# Patient Record
Sex: Female | Born: 1935
Health system: Southern US, Community
[De-identification: ages and names within clinical notes are randomized; demographics above are authoritative.]

## PROBLEM LIST (undated history)

## (undated) DIAGNOSIS — H9191 Unspecified hearing loss, right ear: Secondary | ICD-10-CM

## (undated) DIAGNOSIS — M1990 Unspecified osteoarthritis, unspecified site: Secondary | ICD-10-CM

## (undated) DIAGNOSIS — H9192 Unspecified hearing loss, left ear: Secondary | ICD-10-CM

## (undated) DIAGNOSIS — E042 Nontoxic multinodular goiter: Secondary | ICD-10-CM

## (undated) DIAGNOSIS — I10 Essential (primary) hypertension: Secondary | ICD-10-CM

## (undated) DIAGNOSIS — L409 Psoriasis, unspecified: Secondary | ICD-10-CM

## (undated) DIAGNOSIS — R011 Cardiac murmur, unspecified: Secondary | ICD-10-CM

## (undated) DIAGNOSIS — E0590 Thyrotoxicosis, unspecified without thyrotoxic crisis or storm: Secondary | ICD-10-CM

## (undated) DIAGNOSIS — D447 Neoplasm of uncertain behavior of aortic body and other paraganglia: Secondary | ICD-10-CM

## (undated) DIAGNOSIS — H3530 Unspecified macular degeneration: Secondary | ICD-10-CM

## (undated) DIAGNOSIS — M5030 Other cervical disc degeneration, unspecified cervical region: Secondary | ICD-10-CM

## (undated) DIAGNOSIS — K5790 Diverticulosis of intestine, part unspecified, without perforation or abscess without bleeding: Secondary | ICD-10-CM

## (undated) DIAGNOSIS — K219 Gastro-esophageal reflux disease without esophagitis: Secondary | ICD-10-CM

## (undated) DIAGNOSIS — J189 Pneumonia, unspecified organism: Secondary | ICD-10-CM

## (undated) DIAGNOSIS — N200 Calculus of kidney: Secondary | ICD-10-CM

## (undated) HISTORY — DX: Calculus of kidney: N20.0

## (undated) HISTORY — DX: Cardiac murmur, unspecified: R01.1

## (undated) HISTORY — PX: OTHER SURGICAL HISTORY: SHX169

## (undated) HISTORY — DX: Unspecified hearing loss, right ear: H91.91

## (undated) HISTORY — PX: CATARACT EXTRACTION: SUR2

## (undated) HISTORY — DX: Unspecified hearing loss, left ear: H91.92

## (undated) HISTORY — DX: Other cervical disc degeneration, unspecified cervical region: M50.30

## (undated) HISTORY — DX: Unspecified osteoarthritis, unspecified site: M19.90

## (undated) HISTORY — DX: Diverticulosis of intestine, part unspecified, without perforation or abscess without bleeding: K57.90

## (undated) HISTORY — PX: APPENDECTOMY: SHX54

## (undated) HISTORY — DX: Psoriasis, unspecified: L40.9

## (undated) HISTORY — PX: COLONOSCOPY: SHX174

---

## 1977-07-22 HISTORY — PX: TOTAL ABDOMINAL HYSTERECTOMY: SHX209

## 1994-07-22 HISTORY — PX: BILATERAL SALPINGOOPHORECTOMY: SHX1223

## 2000-07-22 HISTORY — PX: CHOLECYSTECTOMY: SHX55

## 2000-11-20 ENCOUNTER — Encounter: Admission: RE | Admit: 2000-11-20 | Discharge: 2000-11-20 | Payer: Self-pay | Admitting: *Deleted

## 2000-11-20 ENCOUNTER — Encounter: Payer: Self-pay | Admitting: *Deleted

## 2000-12-25 ENCOUNTER — Encounter (INDEPENDENT_AMBULATORY_CARE_PROVIDER_SITE_OTHER): Payer: Self-pay | Admitting: *Deleted

## 2000-12-25 ENCOUNTER — Ambulatory Visit (HOSPITAL_COMMUNITY): Admission: RE | Admit: 2000-12-25 | Discharge: 2000-12-26 | Payer: Self-pay

## 2001-12-08 ENCOUNTER — Encounter: Admission: RE | Admit: 2001-12-08 | Discharge: 2002-03-08 | Payer: Self-pay | Admitting: *Deleted

## 2002-03-11 ENCOUNTER — Ambulatory Visit (HOSPITAL_COMMUNITY): Admission: RE | Admit: 2002-03-11 | Discharge: 2002-03-11 | Payer: Self-pay | Admitting: *Deleted

## 2005-01-29 ENCOUNTER — Encounter: Admission: RE | Admit: 2005-01-29 | Discharge: 2005-04-15 | Payer: Self-pay | Admitting: *Deleted

## 2006-03-12 ENCOUNTER — Encounter: Admission: RE | Admit: 2006-03-12 | Discharge: 2006-05-06 | Payer: Self-pay | Admitting: Family Medicine

## 2007-11-19 ENCOUNTER — Encounter: Admission: RE | Admit: 2007-11-19 | Discharge: 2007-11-19 | Payer: Self-pay | Admitting: Specialist

## 2009-03-02 ENCOUNTER — Other Ambulatory Visit: Admission: RE | Admit: 2009-03-02 | Discharge: 2009-03-02 | Payer: Self-pay | Admitting: Obstetrics and Gynecology

## 2010-12-07 NOTE — Op Note (Signed)
[place]. [facility]  Patient:    Lisa [name], Lisa [name]                          MRN: [id number] Proc. Date: 12/25/00 Adm. Date:  [id number] Attending:  Gennie Alma CC:         Al Decant. Janey Greaser, M.D.   Operative Report  CCS# [id number]  PREOPERATIVE DIAGNOSIS:  Chronic cholecystitis and cholelithiasis.  POSTOPERATIVE DIAGNOSIS:  Chronic cholecystitis and cholelithiasis.  OPERATION PERFORMED:  Laparoscopic cholecystectomy.  SURGEON:  Milus Mallick, M.D.  ASSISTANT:  Abigail Miyamoto, M.D.  ANESTHESIA:  General endotracheal.  DESCRIPTION OF PROCEDURE:  Under adequate general endotracheal anesthesia, the patients abdomen was prepped and draped in the usual fashion.  A short vertical incision was made superior to the umbilicus because of the scars of previous surgery that were inferior to the umbilicus.  The incision was carried down through the subcutaneous tissues.  The deep fascia was exposed and incised longitudinally.  The peritoneum was incised and under direct vision, the 10 mm Hasson cannula was introduced into the peritoneal cavity atraumatically.  Holding sutures of 0 Vicryl were placed on the edge of the fascia to secure the Hasson cannula.  The abdomen was inflated with carbon dioxide to a filling pressure of 15 mmHg with an automatic insufflator.  A video laparoscope was introduced and under direct vision, an upper midline 10 mm port was inserted and two 5 mm subcostal ports.  The gallbladder was exposed and grasped with grasping forceps placed down the subcostal ports placed on traction superiorly.  The patient was placed in the reversed Trendelenburg position and turned to the left.  There was a large portion of omentum that was adherent to the gallbladder and this was taken down over the Endoclips and with some electrocoagulation.  The gallbladder was then completely exposed and the triangle of Calot.  Dissection was carried out  the triangle of Calot.  The cystic duct was isolated, skeletonized, quadruply clipped with endoclips and transected between the distal two clips.  The cystic artery was similarly handled except that three clips were applied and the artery was transected between the distal two clips.  The gallbladder was dissected out of the gallbladder bed of the liver using hook coagulation. There was a pinpoint hole made in the gallbladder doing this with some leakage of bile but not very much.  The bile was aspirated and the area was irrigated with saline.  When the gallbladder was completely removed from the gallbladder bed, the bed was checked and there were no areas of bleeding.  The gallbladder was placed in an Endocatch bag which was withdrawn into the umbilical port which was withdrawn from the abdomen, thereby removing the gallbladder.  It had a single large stone.  Next, the subhepatic and subphrenic spaces were irrigated with sterile saline solution until clear.  The medial subcostal port was withdrawn and all was well.  The lateral port was withdrawn and there was bleeding internally from the lateral subcostal port incision. Electrocoagulation was attempted but did not stop the bleeding.  Therefore, an Endostitch was placed with 0 Vicryl for control of the bleeding.  Next, the umbilical port was removed and the fascia was repaired with interrupted sutures of 0 PDS.  The abdomen was deflated and the upper midline port was removed.  All of the subcuticular layers were repaired with 5-0 Vicryl and half-inch Steri-Strips.  All of the incisions  were infiltrated with 0.25% Marcaine with 1:100,000 parts epinephrine.  A total of 15 cc was used. The patient tolerated the procedure well and left the operating room in satisfactory condition. DD:  12/25/00 TD:  12/25/00 Job: [id number]

## 2011-08-08 DIAGNOSIS — Z1231 Encounter for screening mammogram for malignant neoplasm of breast: Secondary | ICD-10-CM | POA: Diagnosis not present

## 2011-09-09 DIAGNOSIS — L408 Other psoriasis: Secondary | ICD-10-CM | POA: Diagnosis not present

## 2011-09-09 DIAGNOSIS — L820 Inflamed seborrheic keratosis: Secondary | ICD-10-CM | POA: Diagnosis not present

## 2011-09-09 DIAGNOSIS — D485 Neoplasm of uncertain behavior of skin: Secondary | ICD-10-CM | POA: Diagnosis not present

## 2011-09-09 DIAGNOSIS — L940 Localized scleroderma [morphea]: Secondary | ICD-10-CM | POA: Diagnosis not present

## 2011-11-06 DIAGNOSIS — H903 Sensorineural hearing loss, bilateral: Secondary | ICD-10-CM | POA: Diagnosis not present

## 2011-11-06 DIAGNOSIS — H905 Unspecified sensorineural hearing loss: Secondary | ICD-10-CM | POA: Diagnosis not present

## 2011-11-07 ENCOUNTER — Other Ambulatory Visit: Payer: Self-pay | Admitting: Otolaryngology

## 2011-11-12 ENCOUNTER — Ambulatory Visit
Admission: RE | Admit: 2011-11-12 | Discharge: 2011-11-12 | Disposition: A | Discharge status: Home / Self Care | Payer: Medicare Other | Source: Ambulatory Visit | Attending: Otolaryngology | Admitting: Otolaryngology

## 2011-11-12 DIAGNOSIS — H9190 Unspecified hearing loss, unspecified ear: Secondary | ICD-10-CM | POA: Diagnosis not present

## 2011-11-12 MED ORDER — GADOBENATE DIMEGLUMINE 529 MG/ML IV SOLN
20.0000 mL | Freq: Once | INTRAVENOUS | Status: AC | PRN
  Administered 2011-11-12: 20 mL via INTRAVENOUS

## 2011-11-20 DIAGNOSIS — D447 Neoplasm of uncertain behavior of aortic body and other paraganglia: Secondary | ICD-10-CM | POA: Diagnosis not present

## 2011-12-03 DIAGNOSIS — H903 Sensorineural hearing loss, bilateral: Secondary | ICD-10-CM | POA: Diagnosis not present

## 2011-12-03 DIAGNOSIS — D447 Neoplasm of uncertain behavior of aortic body and other paraganglia: Secondary | ICD-10-CM | POA: Diagnosis not present

## 2011-12-17 DIAGNOSIS — D356 Benign neoplasm of aortic body and other paraganglia: Secondary | ICD-10-CM | POA: Diagnosis not present

## 2011-12-19 DIAGNOSIS — H905 Unspecified sensorineural hearing loss: Secondary | ICD-10-CM | POA: Diagnosis not present

## 2011-12-19 DIAGNOSIS — R1310 Dysphagia, unspecified: Secondary | ICD-10-CM | POA: Diagnosis not present

## 2011-12-19 DIAGNOSIS — D447 Neoplasm of uncertain behavior of aortic body and other paraganglia: Secondary | ICD-10-CM | POA: Diagnosis not present

## 2011-12-19 DIAGNOSIS — H903 Sensorineural hearing loss, bilateral: Secondary | ICD-10-CM | POA: Diagnosis not present

## 2011-12-26 DIAGNOSIS — K219 Gastro-esophageal reflux disease without esophagitis: Secondary | ICD-10-CM | POA: Diagnosis not present

## 2011-12-26 DIAGNOSIS — D447 Neoplasm of uncertain behavior of aortic body and other paraganglia: Secondary | ICD-10-CM | POA: Diagnosis not present

## 2011-12-26 DIAGNOSIS — R1310 Dysphagia, unspecified: Secondary | ICD-10-CM | POA: Diagnosis not present

## 2012-01-08 DIAGNOSIS — D369 Benign neoplasm, unspecified site: Secondary | ICD-10-CM | POA: Diagnosis not present

## 2012-01-08 DIAGNOSIS — E782 Mixed hyperlipidemia: Secondary | ICD-10-CM | POA: Diagnosis not present

## 2012-01-08 DIAGNOSIS — I10 Essential (primary) hypertension: Secondary | ICD-10-CM | POA: Diagnosis not present

## 2012-01-08 DIAGNOSIS — E119 Type 2 diabetes mellitus without complications: Secondary | ICD-10-CM | POA: Diagnosis not present

## 2012-01-08 DIAGNOSIS — E049 Nontoxic goiter, unspecified: Secondary | ICD-10-CM | POA: Diagnosis not present

## 2012-01-09 DIAGNOSIS — E785 Hyperlipidemia, unspecified: Secondary | ICD-10-CM | POA: Diagnosis not present

## 2012-01-09 DIAGNOSIS — Z8249 Family history of ischemic heart disease and other diseases of the circulatory system: Secondary | ICD-10-CM | POA: Diagnosis not present

## 2012-01-09 DIAGNOSIS — I252 Old myocardial infarction: Secondary | ICD-10-CM | POA: Diagnosis not present

## 2012-01-09 DIAGNOSIS — I10 Essential (primary) hypertension: Secondary | ICD-10-CM | POA: Diagnosis not present

## 2012-01-09 DIAGNOSIS — I359 Nonrheumatic aortic valve disorder, unspecified: Secondary | ICD-10-CM | POA: Diagnosis not present

## 2012-01-17 DIAGNOSIS — Z961 Presence of intraocular lens: Secondary | ICD-10-CM | POA: Diagnosis not present

## 2012-01-22 DIAGNOSIS — R21 Rash and other nonspecific skin eruption: Secondary | ICD-10-CM | POA: Diagnosis not present

## 2012-01-22 DIAGNOSIS — K130 Diseases of lips: Secondary | ICD-10-CM | POA: Diagnosis not present

## 2012-01-24 DIAGNOSIS — D356 Benign neoplasm of aortic body and other paraganglia: Secondary | ICD-10-CM | POA: Diagnosis not present

## 2012-01-24 DIAGNOSIS — D496 Neoplasm of unspecified behavior of brain: Secondary | ICD-10-CM | POA: Diagnosis not present

## 2012-01-24 DIAGNOSIS — D447 Neoplasm of uncertain behavior of aortic body and other paraganglia: Secondary | ICD-10-CM | POA: Diagnosis not present

## 2012-01-28 DIAGNOSIS — R509 Fever, unspecified: Secondary | ICD-10-CM | POA: Diagnosis not present

## 2012-01-28 DIAGNOSIS — R918 Other nonspecific abnormal finding of lung field: Secondary | ICD-10-CM | POA: Diagnosis not present

## 2012-02-06 DIAGNOSIS — N390 Urinary tract infection, site not specified: Secondary | ICD-10-CM | POA: Diagnosis not present

## 2012-02-17 DIAGNOSIS — E049 Nontoxic goiter, unspecified: Secondary | ICD-10-CM | POA: Diagnosis not present

## 2012-02-17 DIAGNOSIS — D447 Neoplasm of uncertain behavior of aortic body and other paraganglia: Secondary | ICD-10-CM | POA: Diagnosis not present

## 2012-02-27 DIAGNOSIS — I10 Essential (primary) hypertension: Secondary | ICD-10-CM | POA: Diagnosis not present

## 2012-02-27 DIAGNOSIS — R5381 Other malaise: Secondary | ICD-10-CM | POA: Diagnosis not present

## 2012-02-27 DIAGNOSIS — Z87440 Personal history of urinary (tract) infections: Secondary | ICD-10-CM | POA: Diagnosis not present

## 2012-02-27 DIAGNOSIS — R5383 Other fatigue: Secondary | ICD-10-CM | POA: Diagnosis not present

## 2012-03-16 DIAGNOSIS — E042 Nontoxic multinodular goiter: Secondary | ICD-10-CM | POA: Diagnosis not present

## 2012-06-10 DIAGNOSIS — D369 Benign neoplasm, unspecified site: Secondary | ICD-10-CM | POA: Diagnosis not present

## 2012-06-10 DIAGNOSIS — E119 Type 2 diabetes mellitus without complications: Secondary | ICD-10-CM | POA: Diagnosis not present

## 2012-06-10 DIAGNOSIS — E782 Mixed hyperlipidemia: Secondary | ICD-10-CM | POA: Diagnosis not present

## 2012-06-10 DIAGNOSIS — E049 Nontoxic goiter, unspecified: Secondary | ICD-10-CM | POA: Diagnosis not present

## 2012-06-10 DIAGNOSIS — I10 Essential (primary) hypertension: Secondary | ICD-10-CM | POA: Diagnosis not present

## 2012-06-10 DIAGNOSIS — R350 Frequency of micturition: Secondary | ICD-10-CM | POA: Diagnosis not present

## 2012-06-11 DIAGNOSIS — H903 Sensorineural hearing loss, bilateral: Secondary | ICD-10-CM | POA: Diagnosis not present

## 2012-06-11 DIAGNOSIS — H905 Unspecified sensorineural hearing loss: Secondary | ICD-10-CM | POA: Diagnosis not present

## 2012-06-11 DIAGNOSIS — D447 Neoplasm of uncertain behavior of aortic body and other paraganglia: Secondary | ICD-10-CM | POA: Diagnosis not present

## 2012-06-23 DIAGNOSIS — R350 Frequency of micturition: Secondary | ICD-10-CM | POA: Diagnosis not present

## 2012-06-23 DIAGNOSIS — N764 Abscess of vulva: Secondary | ICD-10-CM | POA: Diagnosis not present

## 2012-06-23 DIAGNOSIS — K6289 Other specified diseases of anus and rectum: Secondary | ICD-10-CM | POA: Diagnosis not present

## 2012-06-30 DIAGNOSIS — N390 Urinary tract infection, site not specified: Secondary | ICD-10-CM | POA: Diagnosis not present

## 2012-06-30 DIAGNOSIS — N764 Abscess of vulva: Secondary | ICD-10-CM | POA: Diagnosis not present

## 2012-07-07 DIAGNOSIS — L738 Other specified follicular disorders: Secondary | ICD-10-CM | POA: Diagnosis not present

## 2012-07-23 DIAGNOSIS — L738 Other specified follicular disorders: Secondary | ICD-10-CM | POA: Diagnosis not present

## 2012-07-24 DIAGNOSIS — D447 Neoplasm of uncertain behavior of aortic body and other paraganglia: Secondary | ICD-10-CM | POA: Diagnosis not present

## 2012-07-24 DIAGNOSIS — H903 Sensorineural hearing loss, bilateral: Secondary | ICD-10-CM | POA: Diagnosis not present

## 2012-07-24 DIAGNOSIS — H905 Unspecified sensorineural hearing loss: Secondary | ICD-10-CM | POA: Diagnosis not present

## 2012-08-18 DIAGNOSIS — R05 Cough: Secondary | ICD-10-CM | POA: Diagnosis not present

## 2012-08-18 DIAGNOSIS — J069 Acute upper respiratory infection, unspecified: Secondary | ICD-10-CM | POA: Diagnosis not present

## 2012-09-15 DIAGNOSIS — N760 Acute vaginitis: Secondary | ICD-10-CM | POA: Diagnosis not present

## 2012-09-15 DIAGNOSIS — R350 Frequency of micturition: Secondary | ICD-10-CM | POA: Diagnosis not present

## 2012-10-09 DIAGNOSIS — E119 Type 2 diabetes mellitus without complications: Secondary | ICD-10-CM | POA: Diagnosis not present

## 2012-10-09 DIAGNOSIS — I10 Essential (primary) hypertension: Secondary | ICD-10-CM | POA: Diagnosis not present

## 2012-10-09 DIAGNOSIS — R5381 Other malaise: Secondary | ICD-10-CM | POA: Diagnosis not present

## 2012-10-09 DIAGNOSIS — Z79899 Other long term (current) drug therapy: Secondary | ICD-10-CM | POA: Diagnosis not present

## 2012-10-20 DIAGNOSIS — IMO0002 Reserved for concepts with insufficient information to code with codable children: Secondary | ICD-10-CM | POA: Diagnosis not present

## 2012-11-17 DIAGNOSIS — M129 Arthropathy, unspecified: Secondary | ICD-10-CM | POA: Diagnosis not present

## 2012-11-17 DIAGNOSIS — L293 Anogenital pruritus, unspecified: Secondary | ICD-10-CM | POA: Diagnosis not present

## 2012-11-17 DIAGNOSIS — IMO0002 Reserved for concepts with insufficient information to code with codable children: Secondary | ICD-10-CM | POA: Diagnosis not present

## 2012-12-08 DIAGNOSIS — D447 Neoplasm of uncertain behavior of aortic body and other paraganglia: Secondary | ICD-10-CM | POA: Diagnosis not present

## 2012-12-08 DIAGNOSIS — H905 Unspecified sensorineural hearing loss: Secondary | ICD-10-CM | POA: Diagnosis not present

## 2012-12-08 DIAGNOSIS — H903 Sensorineural hearing loss, bilateral: Secondary | ICD-10-CM | POA: Diagnosis not present

## 2012-12-15 DIAGNOSIS — K219 Gastro-esophageal reflux disease without esophagitis: Secondary | ICD-10-CM | POA: Diagnosis not present

## 2012-12-15 DIAGNOSIS — IMO0002 Reserved for concepts with insufficient information to code with codable children: Secondary | ICD-10-CM | POA: Diagnosis not present

## 2012-12-15 DIAGNOSIS — L0390 Cellulitis, unspecified: Secondary | ICD-10-CM | POA: Diagnosis not present

## 2013-01-04 DIAGNOSIS — Z1231 Encounter for screening mammogram for malignant neoplasm of breast: Secondary | ICD-10-CM | POA: Diagnosis not present

## 2013-01-07 DIAGNOSIS — Z8249 Family history of ischemic heart disease and other diseases of the circulatory system: Secondary | ICD-10-CM | POA: Diagnosis not present

## 2013-01-07 DIAGNOSIS — I359 Nonrheumatic aortic valve disorder, unspecified: Secondary | ICD-10-CM | POA: Diagnosis not present

## 2013-01-07 DIAGNOSIS — I10 Essential (primary) hypertension: Secondary | ICD-10-CM | POA: Diagnosis not present

## 2013-01-13 DIAGNOSIS — H1030 Unspecified acute conjunctivitis, unspecified eye: Secondary | ICD-10-CM | POA: Diagnosis not present

## 2013-01-13 DIAGNOSIS — H10439 Chronic follicular conjunctivitis, unspecified eye: Secondary | ICD-10-CM | POA: Diagnosis not present

## 2013-01-19 DIAGNOSIS — E042 Nontoxic multinodular goiter: Secondary | ICD-10-CM | POA: Diagnosis not present

## 2013-01-19 DIAGNOSIS — D447 Neoplasm of uncertain behavior of aortic body and other paraganglia: Secondary | ICD-10-CM | POA: Diagnosis not present

## 2013-01-19 DIAGNOSIS — H903 Sensorineural hearing loss, bilateral: Secondary | ICD-10-CM | POA: Diagnosis not present

## 2013-01-19 DIAGNOSIS — H905 Unspecified sensorineural hearing loss: Secondary | ICD-10-CM | POA: Diagnosis not present

## 2013-02-11 DIAGNOSIS — Z961 Presence of intraocular lens: Secondary | ICD-10-CM | POA: Diagnosis not present

## 2013-02-11 DIAGNOSIS — H52229 Regular astigmatism, unspecified eye: Secondary | ICD-10-CM | POA: Diagnosis not present

## 2013-02-11 DIAGNOSIS — H5210 Myopia, unspecified eye: Secondary | ICD-10-CM | POA: Diagnosis not present

## 2013-02-24 DIAGNOSIS — L03019 Cellulitis of unspecified finger: Secondary | ICD-10-CM | POA: Diagnosis not present

## 2013-04-05 DIAGNOSIS — E049 Nontoxic goiter, unspecified: Secondary | ICD-10-CM | POA: Diagnosis not present

## 2013-05-06 DIAGNOSIS — L408 Other psoriasis: Secondary | ICD-10-CM | POA: Diagnosis not present

## 2013-05-06 DIAGNOSIS — B078 Other viral warts: Secondary | ICD-10-CM | POA: Diagnosis not present

## 2013-05-06 DIAGNOSIS — L820 Inflamed seborrheic keratosis: Secondary | ICD-10-CM | POA: Diagnosis not present

## 2013-05-06 DIAGNOSIS — Z23 Encounter for immunization: Secondary | ICD-10-CM | POA: Diagnosis not present

## 2013-07-05 DIAGNOSIS — G8929 Other chronic pain: Secondary | ICD-10-CM | POA: Insufficient documentation

## 2013-07-05 DIAGNOSIS — I10 Essential (primary) hypertension: Secondary | ICD-10-CM | POA: Diagnosis not present

## 2013-07-05 DIAGNOSIS — M5126 Other intervertebral disc displacement, lumbar region: Secondary | ICD-10-CM | POA: Insufficient documentation

## 2013-07-05 DIAGNOSIS — E669 Obesity, unspecified: Secondary | ICD-10-CM | POA: Diagnosis not present

## 2013-07-05 DIAGNOSIS — M5417 Radiculopathy, lumbosacral region: Secondary | ICD-10-CM | POA: Insufficient documentation

## 2013-07-05 DIAGNOSIS — IMO0002 Reserved for concepts with insufficient information to code with codable children: Secondary | ICD-10-CM | POA: Diagnosis not present

## 2013-10-01 DIAGNOSIS — R350 Frequency of micturition: Secondary | ICD-10-CM | POA: Diagnosis not present

## 2013-10-01 DIAGNOSIS — N9489 Other specified conditions associated with female genital organs and menstrual cycle: Secondary | ICD-10-CM | POA: Diagnosis not present

## 2013-10-01 DIAGNOSIS — Z01419 Encounter for gynecological examination (general) (routine) without abnormal findings: Secondary | ICD-10-CM | POA: Diagnosis not present

## 2013-10-06 DIAGNOSIS — R05 Cough: Secondary | ICD-10-CM | POA: Diagnosis not present

## 2013-10-06 DIAGNOSIS — R059 Cough, unspecified: Secondary | ICD-10-CM | POA: Diagnosis not present

## 2013-11-05 DIAGNOSIS — Z1211 Encounter for screening for malignant neoplasm of colon: Secondary | ICD-10-CM | POA: Diagnosis not present

## 2013-11-05 DIAGNOSIS — K648 Other hemorrhoids: Secondary | ICD-10-CM | POA: Diagnosis not present

## 2013-11-05 DIAGNOSIS — Z8371 Family history of colonic polyps: Secondary | ICD-10-CM | POA: Diagnosis not present

## 2013-11-05 DIAGNOSIS — D126 Benign neoplasm of colon, unspecified: Secondary | ICD-10-CM | POA: Diagnosis not present

## 2013-11-05 DIAGNOSIS — K5730 Diverticulosis of large intestine without perforation or abscess without bleeding: Secondary | ICD-10-CM | POA: Diagnosis not present

## 2013-11-05 DIAGNOSIS — K5520 Angiodysplasia of colon without hemorrhage: Secondary | ICD-10-CM | POA: Diagnosis not present

## 2013-11-24 DIAGNOSIS — IMO0002 Reserved for concepts with insufficient information to code with codable children: Secondary | ICD-10-CM | POA: Diagnosis not present

## 2013-12-06 DIAGNOSIS — L408 Other psoriasis: Secondary | ICD-10-CM | POA: Diagnosis not present

## 2013-12-06 DIAGNOSIS — D447 Neoplasm of uncertain behavior of aortic body and other paraganglia: Secondary | ICD-10-CM | POA: Diagnosis not present

## 2013-12-06 DIAGNOSIS — H903 Sensorineural hearing loss, bilateral: Secondary | ICD-10-CM | POA: Diagnosis not present

## 2013-12-06 DIAGNOSIS — H905 Unspecified sensorineural hearing loss: Secondary | ICD-10-CM | POA: Diagnosis not present

## 2014-01-06 ENCOUNTER — Encounter: Payer: Self-pay | Admitting: Cardiology

## 2014-01-06 ENCOUNTER — Ambulatory Visit (INDEPENDENT_AMBULATORY_CARE_PROVIDER_SITE_OTHER): Payer: Medicare Other | Admitting: Cardiology

## 2014-01-06 ENCOUNTER — Encounter (INDEPENDENT_AMBULATORY_CARE_PROVIDER_SITE_OTHER): Payer: Self-pay

## 2014-01-06 VITALS — BP 176/81 | HR 88 | Ht 61.0 in | Wt 212.0 lb

## 2014-01-06 DIAGNOSIS — I10 Essential (primary) hypertension: Secondary | ICD-10-CM

## 2014-01-06 DIAGNOSIS — I350 Nonrheumatic aortic (valve) stenosis: Secondary | ICD-10-CM

## 2014-01-06 DIAGNOSIS — E119 Type 2 diabetes mellitus without complications: Secondary | ICD-10-CM | POA: Insufficient documentation

## 2014-01-06 DIAGNOSIS — I359 Nonrheumatic aortic valve disorder, unspecified: Secondary | ICD-10-CM

## 2014-01-06 DIAGNOSIS — E1165 Type 2 diabetes mellitus with hyperglycemia: Secondary | ICD-10-CM | POA: Insufficient documentation

## 2014-01-06 DIAGNOSIS — Z8249 Family history of ischemic heart disease and other diseases of the circulatory system: Secondary | ICD-10-CM | POA: Diagnosis not present

## 2014-01-06 MED ORDER — LOSARTAN POTASSIUM-HCTZ 100-25 MG PO TABS: 1.0000 | ORAL_TABLET | Freq: Every day | ORAL | Status: DC

## 2014-01-06 NOTE — Patient Instructions (Addendum)
Your physician has recommended you make the following change in your medication:  1. Increase Hyzaar 100/25 mg 1 tab daily  Your physician wants you to follow-up in: 1 year with Dr. Dawna Part will receive a reminder letter in the mail two months in advance. If you don't receive a letter, please call our office to schedule the follow-up appointment.    Your physician has requested that you have an echocardiogram. Echocardiography is a painless test that uses sound waves to create images of your heart. It provides your doctor with information about the size and shape of your heart and how well your heart's chambers and valves are working. This procedure takes approximately one hour. There are no restrictions for this procedure.

## 2014-01-06 NOTE — Progress Notes (Signed)
[place]. [address], [address], [place]  [address] Phone: [phone] Fax:  [phone]  Date:  01/06/2014   ID:  Lisa [name], DOB February 01, 1936, MRN [id number]  PCP:  Gerrit Heck, MD   History of Present Illness: Lisa [name] is a 78 y.o. female with hypertension, obesity, borderline diabetes, family history of CAD here for follow up visit. 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 This is currently the size of a thumbnail, discovered when she had an MRI to consider implant. [facility].   She states that she has her ups and downs. She has been taking her meloxicam on a daily basis because of pain. Approximately 2 weeks ago she did have some body aches. No fevers. Almost flulike symptoms. Her blood pressure has been elevated recently.   No chest pain. Has severe OA and has trouble with stairs. Water aerobics. Mild dyspnea. Improving. No syncope, no palpitations. Her brother and sister recently had CAD issues. Brother had CABG 15 years ago.  We discussed her nuclear stress test from 2009 as well as her echocardiogram and both were reassuring in the sense that there was no evidence of inferior infarction as demonstrated on EKG both today and previously.  Has aortic murmur. Radiation to the carotids. Carotid Doppler was normal. Aortic murmur was assessed in 2012 showed only mild stenosis.    Wt Readings from Last 3 Encounters:  01/06/14 212 lb (96.163 kg)     Past Medical History  Diagnosis Date  . Arthritis   . Deafness in right ear   . Hearing loss in left ear   . Psoriasis   . Heart murmur     History reviewed. No pertinent past surgical history.  Current Outpatient Prescriptions  Medication Sig Dispense Refill  . acetaminophen (TYLENOL) 325 MG tablet Take 650 mg by mouth every 6 (six) hours as needed. 3/4 tabs daily if needed      . amLODipine (NORVASC) 5 MG tablet Take 5 mg by mouth daily.         [name] Kitchen aspirin 81 MG tablet Take 81 mg by mouth as needed for pain.      [name] Kitchen losartan-hydrochlorothiazide (HYZAAR) 50-12.5 MG per tablet Take 1 tablet by mouth daily.       . meloxicam (MOBIC) 7.5 MG tablet Take 7.5 mg by mouth daily.       [name] Kitchen omeprazole (PRILOSEC) 20 MG capsule Take 20 mg by mouth daily.        No current facility-administered medications for this visit.    Allergies:    Allergies  Allergen Reactions  . Lisinopril     Cough   . Penicillins     whelp    Social History:  The patient  reports that she has quit smoking. She does not have any smokeless tobacco history on file.   Family History  Problem Relation Age of Onset  . Heart disease Mother   . Heart disease Father   . Heart disease Brother   . Heart disease Paternal Grandfather   . Heart disease Brother     ROS:  Please see the history of present illness.     All other systems reviewed and negative.   PHYSICAL EXAM: VS:  BP 176/81  Pulse 88  Ht 5\' 1"  (1.549 m)  Wt 212 lb (96.163 kg)  BMI 40.08 kg/m2 Well nourished, well developed, in no acute  distress HEENT: normal, /AT, EOMI Neck: no JVD, normal carotid upstroke, no bruit Cardiac:  normal S1, S2; RRR; 2/6 SM RUSB Lungs:  clear to auscultation bilaterally, no wheezing, rhonchi or rales Abd: soft, nontender, no hepatomegaly, no bruits Ext: no edema, 2+ distal pulses, overweight.  Skin: warm and dry GU: deferred Neuro: no focal abnormalities noted, AAO x 3  EKG:   01/06/14-sinus rhythm, 89, old inferior infarct pattern. No change from prior. Nuclear stress test 2009-low risk, no ischemia the ECHO: 2011 - mild aortic stenosis LABS: 2014 -  LDL 110, TSH normal, Hg 15, Creat 0.7  ASSESSMENT AND PLAN:  1. Aortic stenosis-previously mild several years ago. Murmur appreciated. I will repeat echocardiogram. 2. Hypertension-multidrug regimen. NSAIDs can increase blood pressure. 140-180 SBP. Needs better control. I will go ahead and double her Hyzaar.  100/25. She will be seeing Dr. Drema Dallas next Wednesday. Continue with amlodipine. She seemed a bit concerned over change in medication dose. I did state that discontinuation of meloxicam to help blood pressure however she's been taking this daily because of her arthritic pain. 3. Family history coronary artery disease-Brother, father, nephew. Aggressive primary prevention, blood pressure control. 4. Listed in her past medical history from Dr. Drema Dallas. Currently on no medications, diet controlled. 5. Morbid obesity-continue to encourage weight loss. 6. One year followup  Signed, Candee Furbish, MD [facility]  01/06/2014 9:49 AM

## 2014-01-10 DIAGNOSIS — Z1231 Encounter for screening mammogram for malignant neoplasm of breast: Secondary | ICD-10-CM | POA: Diagnosis not present

## 2014-01-12 DIAGNOSIS — Z23 Encounter for immunization: Secondary | ICD-10-CM | POA: Diagnosis not present

## 2014-01-12 DIAGNOSIS — E782 Mixed hyperlipidemia: Secondary | ICD-10-CM | POA: Diagnosis not present

## 2014-01-12 DIAGNOSIS — M899 Disorder of bone, unspecified: Secondary | ICD-10-CM | POA: Diagnosis not present

## 2014-01-12 DIAGNOSIS — L408 Other psoriasis: Secondary | ICD-10-CM | POA: Diagnosis not present

## 2014-01-12 DIAGNOSIS — R5383 Other fatigue: Secondary | ICD-10-CM | POA: Diagnosis not present

## 2014-01-12 DIAGNOSIS — I10 Essential (primary) hypertension: Secondary | ICD-10-CM | POA: Diagnosis not present

## 2014-01-12 DIAGNOSIS — R5381 Other malaise: Secondary | ICD-10-CM | POA: Diagnosis not present

## 2014-01-12 DIAGNOSIS — E119 Type 2 diabetes mellitus without complications: Secondary | ICD-10-CM | POA: Diagnosis not present

## 2014-01-12 DIAGNOSIS — M949 Disorder of cartilage, unspecified: Secondary | ICD-10-CM | POA: Diagnosis not present

## 2014-01-12 DIAGNOSIS — Z1331 Encounter for screening for depression: Secondary | ICD-10-CM | POA: Diagnosis not present

## 2014-01-13 ENCOUNTER — Ambulatory Visit (HOSPITAL_COMMUNITY): Payer: Medicare Other | Attending: Internal Medicine | Admitting: Radiology

## 2014-01-13 DIAGNOSIS — I359 Nonrheumatic aortic valve disorder, unspecified: Secondary | ICD-10-CM

## 2014-01-13 DIAGNOSIS — I350 Nonrheumatic aortic (valve) stenosis: Secondary | ICD-10-CM

## 2014-01-13 DIAGNOSIS — I4891 Unspecified atrial fibrillation: Secondary | ICD-10-CM | POA: Insufficient documentation

## 2014-01-13 NOTE — Progress Notes (Signed)
Echocardiogram performed.  

## 2014-01-27 DIAGNOSIS — H903 Sensorineural hearing loss, bilateral: Secondary | ICD-10-CM | POA: Diagnosis not present

## 2014-01-27 DIAGNOSIS — D447 Neoplasm of uncertain behavior of aortic body and other paraganglia: Secondary | ICD-10-CM | POA: Diagnosis not present

## 2014-01-27 DIAGNOSIS — H6590 Unspecified nonsuppurative otitis media, unspecified ear: Secondary | ICD-10-CM | POA: Diagnosis not present

## 2014-01-27 DIAGNOSIS — H905 Unspecified sensorineural hearing loss: Secondary | ICD-10-CM | POA: Diagnosis not present

## 2014-01-27 DIAGNOSIS — L408 Other psoriasis: Secondary | ICD-10-CM | POA: Diagnosis not present

## 2014-02-03 ENCOUNTER — Other Ambulatory Visit: Payer: Self-pay | Admitting: Rheumatology

## 2014-02-03 ENCOUNTER — Ambulatory Visit
Admission: RE | Admit: 2014-02-03 | Discharge: 2014-02-03 | Disposition: A | Discharge status: Home / Self Care | Payer: Medicare Other | Source: Ambulatory Visit | Attending: Rheumatology | Admitting: Rheumatology

## 2014-02-03 DIAGNOSIS — M79671 Pain in right foot: Secondary | ICD-10-CM

## 2014-02-03 DIAGNOSIS — M545 Low back pain, unspecified: Secondary | ICD-10-CM | POA: Diagnosis not present

## 2014-02-03 DIAGNOSIS — L408 Other psoriasis: Secondary | ICD-10-CM | POA: Diagnosis not present

## 2014-02-03 DIAGNOSIS — M130 Polyarthritis, unspecified: Secondary | ICD-10-CM | POA: Diagnosis not present

## 2014-02-03 DIAGNOSIS — M79672 Pain in left foot: Principal | ICD-10-CM

## 2014-02-03 DIAGNOSIS — M25549 Pain in joints of unspecified hand: Secondary | ICD-10-CM | POA: Diagnosis not present

## 2014-02-03 DIAGNOSIS — M19079 Primary osteoarthritis, unspecified ankle and foot: Secondary | ICD-10-CM | POA: Diagnosis not present

## 2014-03-01 DIAGNOSIS — H353190 Nonexudative age-related macular degeneration, unspecified eye, stage unspecified: Secondary | ICD-10-CM | POA: Diagnosis not present

## 2014-03-01 DIAGNOSIS — I10 Essential (primary) hypertension: Secondary | ICD-10-CM | POA: Diagnosis not present

## 2014-03-01 DIAGNOSIS — H35039 Hypertensive retinopathy, unspecified eye: Secondary | ICD-10-CM | POA: Diagnosis not present

## 2014-03-07 DIAGNOSIS — M545 Low back pain, unspecified: Secondary | ICD-10-CM | POA: Diagnosis not present

## 2014-03-07 DIAGNOSIS — M25579 Pain in unspecified ankle and joints of unspecified foot: Secondary | ICD-10-CM | POA: Diagnosis not present

## 2014-03-07 DIAGNOSIS — L4050 Arthropathic psoriasis, unspecified: Secondary | ICD-10-CM | POA: Diagnosis not present

## 2014-03-07 DIAGNOSIS — M19049 Primary osteoarthritis, unspecified hand: Secondary | ICD-10-CM | POA: Diagnosis not present

## 2014-04-04 DIAGNOSIS — E049 Nontoxic goiter, unspecified: Secondary | ICD-10-CM | POA: Diagnosis not present

## 2014-04-13 DIAGNOSIS — L408 Other psoriasis: Secondary | ICD-10-CM | POA: Diagnosis not present

## 2014-04-13 DIAGNOSIS — M25579 Pain in unspecified ankle and joints of unspecified foot: Secondary | ICD-10-CM | POA: Diagnosis not present

## 2014-04-13 DIAGNOSIS — M25549 Pain in joints of unspecified hand: Secondary | ICD-10-CM | POA: Diagnosis not present

## 2014-04-13 DIAGNOSIS — M25569 Pain in unspecified knee: Secondary | ICD-10-CM | POA: Diagnosis not present

## 2014-04-26 DIAGNOSIS — R42 Dizziness and giddiness: Secondary | ICD-10-CM | POA: Diagnosis not present

## 2014-04-26 DIAGNOSIS — H6590 Unspecified nonsuppurative otitis media, unspecified ear: Secondary | ICD-10-CM | POA: Diagnosis not present

## 2014-05-13 DIAGNOSIS — Z23 Encounter for immunization: Secondary | ICD-10-CM | POA: Diagnosis not present

## 2014-06-07 DIAGNOSIS — J069 Acute upper respiratory infection, unspecified: Secondary | ICD-10-CM | POA: Diagnosis not present

## 2014-06-13 DIAGNOSIS — M25476 Effusion, unspecified foot: Secondary | ICD-10-CM | POA: Diagnosis not present

## 2014-06-13 DIAGNOSIS — L409 Psoriasis, unspecified: Secondary | ICD-10-CM | POA: Diagnosis not present

## 2014-06-13 DIAGNOSIS — M25442 Effusion, left hand: Secondary | ICD-10-CM | POA: Diagnosis not present

## 2014-06-13 DIAGNOSIS — M25441 Effusion, right hand: Secondary | ICD-10-CM | POA: Diagnosis not present

## 2014-07-05 DIAGNOSIS — H903 Sensorineural hearing loss, bilateral: Secondary | ICD-10-CM | POA: Diagnosis not present

## 2014-07-05 DIAGNOSIS — L409 Psoriasis, unspecified: Secondary | ICD-10-CM | POA: Diagnosis not present

## 2014-07-05 DIAGNOSIS — D447 Neoplasm of uncertain behavior of aortic body and other paraganglia: Secondary | ICD-10-CM | POA: Diagnosis not present

## 2014-08-10 DIAGNOSIS — H10403 Unspecified chronic conjunctivitis, bilateral: Secondary | ICD-10-CM | POA: Diagnosis not present

## 2014-08-17 DIAGNOSIS — E782 Mixed hyperlipidemia: Secondary | ICD-10-CM | POA: Diagnosis not present

## 2014-08-17 DIAGNOSIS — E559 Vitamin D deficiency, unspecified: Secondary | ICD-10-CM | POA: Diagnosis not present

## 2014-08-17 DIAGNOSIS — J069 Acute upper respiratory infection, unspecified: Secondary | ICD-10-CM | POA: Diagnosis not present

## 2014-08-17 DIAGNOSIS — I10 Essential (primary) hypertension: Secondary | ICD-10-CM | POA: Diagnosis not present

## 2014-08-17 DIAGNOSIS — E119 Type 2 diabetes mellitus without complications: Secondary | ICD-10-CM | POA: Diagnosis not present

## 2014-08-27 DIAGNOSIS — H04123 Dry eye syndrome of bilateral lacrimal glands: Secondary | ICD-10-CM | POA: Diagnosis not present

## 2014-09-28 DIAGNOSIS — Z79899 Other long term (current) drug therapy: Secondary | ICD-10-CM | POA: Diagnosis not present

## 2014-09-28 DIAGNOSIS — M25476 Effusion, unspecified foot: Secondary | ICD-10-CM | POA: Diagnosis not present

## 2014-09-28 DIAGNOSIS — M25442 Effusion, left hand: Secondary | ICD-10-CM | POA: Diagnosis not present

## 2014-09-28 DIAGNOSIS — M25441 Effusion, right hand: Secondary | ICD-10-CM | POA: Diagnosis not present

## 2014-09-28 DIAGNOSIS — L4050 Arthropathic psoriasis, unspecified: Secondary | ICD-10-CM | POA: Diagnosis not present

## 2014-10-07 DIAGNOSIS — N760 Acute vaginitis: Secondary | ICD-10-CM | POA: Diagnosis not present

## 2014-10-07 DIAGNOSIS — R300 Dysuria: Secondary | ICD-10-CM | POA: Diagnosis not present

## 2014-10-07 DIAGNOSIS — N952 Postmenopausal atrophic vaginitis: Secondary | ICD-10-CM | POA: Diagnosis not present

## 2014-10-19 DIAGNOSIS — Z79899 Other long term (current) drug therapy: Secondary | ICD-10-CM | POA: Diagnosis not present

## 2014-10-19 DIAGNOSIS — L4050 Arthropathic psoriasis, unspecified: Secondary | ICD-10-CM | POA: Diagnosis not present

## 2014-10-19 DIAGNOSIS — K219 Gastro-esophageal reflux disease without esophagitis: Secondary | ICD-10-CM | POA: Diagnosis not present

## 2014-11-09 DIAGNOSIS — J209 Acute bronchitis, unspecified: Secondary | ICD-10-CM | POA: Diagnosis not present

## 2014-11-17 DIAGNOSIS — R05 Cough: Secondary | ICD-10-CM | POA: Diagnosis not present

## 2014-12-08 DIAGNOSIS — L4050 Arthropathic psoriasis, unspecified: Secondary | ICD-10-CM | POA: Diagnosis not present

## 2014-12-08 DIAGNOSIS — K069 Disorder of gingiva and edentulous alveolar ridge, unspecified: Secondary | ICD-10-CM | POA: Diagnosis not present

## 2014-12-08 DIAGNOSIS — M25441 Effusion, right hand: Secondary | ICD-10-CM | POA: Diagnosis not present

## 2014-12-08 DIAGNOSIS — M25474 Effusion, right foot: Secondary | ICD-10-CM | POA: Diagnosis not present

## 2014-12-08 DIAGNOSIS — M25442 Effusion, left hand: Secondary | ICD-10-CM | POA: Diagnosis not present

## 2014-12-08 DIAGNOSIS — Z79899 Other long term (current) drug therapy: Secondary | ICD-10-CM | POA: Diagnosis not present

## 2014-12-11 DIAGNOSIS — S0101XA Laceration without foreign body of scalp, initial encounter: Secondary | ICD-10-CM | POA: Diagnosis not present

## 2014-12-11 DIAGNOSIS — Z23 Encounter for immunization: Secondary | ICD-10-CM | POA: Diagnosis not present

## 2014-12-11 DIAGNOSIS — S0990XA Unspecified injury of head, initial encounter: Secondary | ICD-10-CM | POA: Diagnosis not present

## 2014-12-14 DIAGNOSIS — M8580 Other specified disorders of bone density and structure, unspecified site: Secondary | ICD-10-CM | POA: Diagnosis not present

## 2014-12-14 DIAGNOSIS — E782 Mixed hyperlipidemia: Secondary | ICD-10-CM | POA: Diagnosis not present

## 2014-12-14 DIAGNOSIS — E559 Vitamin D deficiency, unspecified: Secondary | ICD-10-CM | POA: Diagnosis not present

## 2014-12-14 DIAGNOSIS — I350 Nonrheumatic aortic (valve) stenosis: Secondary | ICD-10-CM | POA: Diagnosis not present

## 2014-12-14 DIAGNOSIS — E119 Type 2 diabetes mellitus without complications: Secondary | ICD-10-CM | POA: Diagnosis not present

## 2014-12-14 DIAGNOSIS — I10 Essential (primary) hypertension: Secondary | ICD-10-CM | POA: Diagnosis not present

## 2014-12-16 ENCOUNTER — Other Ambulatory Visit: Payer: Self-pay | Admitting: Family Medicine

## 2014-12-16 DIAGNOSIS — M8580 Other specified disorders of bone density and structure, unspecified site: Secondary | ICD-10-CM

## 2014-12-29 DIAGNOSIS — L4050 Arthropathic psoriasis, unspecified: Secondary | ICD-10-CM | POA: Diagnosis not present

## 2014-12-29 DIAGNOSIS — Z79899 Other long term (current) drug therapy: Secondary | ICD-10-CM | POA: Diagnosis not present

## 2015-01-04 DIAGNOSIS — D447 Neoplasm of uncertain behavior of aortic body and other paraganglia: Secondary | ICD-10-CM | POA: Diagnosis not present

## 2015-01-04 DIAGNOSIS — L409 Psoriasis, unspecified: Secondary | ICD-10-CM | POA: Diagnosis not present

## 2015-01-04 DIAGNOSIS — H903 Sensorineural hearing loss, bilateral: Secondary | ICD-10-CM | POA: Diagnosis not present

## 2015-01-05 ENCOUNTER — Ambulatory Visit (INDEPENDENT_AMBULATORY_CARE_PROVIDER_SITE_OTHER): Payer: Medicare Other | Admitting: Cardiology

## 2015-01-05 ENCOUNTER — Encounter: Payer: Self-pay | Admitting: Cardiology

## 2015-01-05 VITALS — BP 150/64 | HR 65 | Ht 62.0 in | Wt 207.8 lb

## 2015-01-05 DIAGNOSIS — Z8249 Family history of ischemic heart disease and other diseases of the circulatory system: Secondary | ICD-10-CM

## 2015-01-05 DIAGNOSIS — I10 Essential (primary) hypertension: Secondary | ICD-10-CM

## 2015-01-05 DIAGNOSIS — I350 Nonrheumatic aortic (valve) stenosis: Secondary | ICD-10-CM

## 2015-01-05 MED ORDER — LOSARTAN POTASSIUM-HCTZ 100-25 MG PO TABS: 1.0000 | ORAL_TABLET | Freq: Every day | ORAL | Status: DC

## 2015-01-05 NOTE — Progress Notes (Signed)
[place]. [address], [address] [place], [place]  [address] Phone: [phone] Fax:  [phone]  Date:  01/05/2015   ID:  Lisa [name], DOB Sep 19, 1935, MRN [id number]  PCP:  Gerrit Heck, MD   History of Present Illness: Lisa [name] is a 79 y.o. female with hypertension, obesity, borderline diabetes, family history of CAD here for follow up visit. 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 This is currently the size of a thumbnail, discovered when she had an MRI to consider implant with Dr. Elwyn Reach. [place] was in 2012. Dr. Danne Baxter. Skipping MRI this year.   Fall 5/16 - Dr. Vertell Limber. Back pain.   She states that she has her ups and downs. No longer taking her meloxicam. No fevers. Almost flulike symptoms. Her blood pressure has been elevated recently.   No chest pain. Has severe OA and has trouble with stairs. Taking methotrexate.  Water aerobics. Mild dyspnea. Improving. No syncope, no palpitations. Her brother and sister recently had CAD issues. Brother had CABG 15 years ago.  We discussed her nuclear stress test from 2009 as well as her echocardiogram and both were reassuring in the sense that there was no evidence of inferior infarction as demonstrated on EKG both today and previously.  Has aortic murmur. Radiation to the carotids. Carotid Doppler was normal. Aortic murmur was assessed in 2015 showed only very mild stenosis.    Wt Readings from Last 3 Encounters:  01/05/15 207 lb 12.8 oz (94.257 kg)  01/06/14 212 lb (96.163 kg)     Past Medical History  Diagnosis Date  . Arthritis   . Deafness in right ear   . Hearing loss in left ear   . Psoriasis   . Heart murmur   . Diverticulosis   . Kidney stones   . DDD (degenerative disc disease), cervical     Past Surgical History  Procedure Laterality Date  . Appendectomy    . Total abdominal hysterectomy  1979    FIBROIDS   .  Bilateral salpingoophorectomy  1996  . Cholecystectomy  2002  . Cataract extraction    . Colonoscopy  2003/2009/2015    Current Outpatient Prescriptions  Medication Sig Dispense Refill  . acetaminophen (TYLENOL) 325 MG tablet Take 650 mg by mouth every 6 (six) hours as needed. 3/4 tabs daily if needed    . amLODipine (NORVASC) 5 MG tablet Take 5 mg by mouth daily.     [name] Kitchen aspirin 81 MG tablet Take 81 mg by mouth as needed for pain.    . folic acid (FOLVITE) 1 MG tablet Take 1 mg by mouth daily.     [name] Kitchen losartan-hydrochlorothiazide (HYZAAR) 100-25 MG per tablet Take 1 tablet by mouth daily. 30 tablet 12  . methotrexate (RHEUMATREX) 2.5 MG tablet Take 10 mg by mouth 2 (two) times a week.     [name] Kitchen omeprazole (PRILOSEC) 20 MG capsule Take 20 mg by mouth daily.     . predniSONE (DELTASONE) 5 MG tablet Take 5 mg by mouth every other day.      No current facility-administered medications for this visit.    Allergies:    Allergies  Allergen Reactions  . Lisinopril     Cough   . Penicillins     whelp    Social History:  The patient  reports that she has quit smoking. She does not have  any smokeless tobacco history on file. She reports that she drinks alcohol. She reports that she does not use illicit drugs.   Family History  Problem Relation Age of Onset  . Heart disease Mother   . Heart disease Father   . Heart disease Brother   . Heart disease Paternal Grandfather   . Heart disease Brother     ROS:  Please see the history of present illness.     All other systems reviewed and negative.   PHYSICAL EXAM: VS:  BP 150/64 mmHg  Pulse 65  Ht 5\' 2"  (1.575 m)  Wt 207 lb 12.8 oz (94.257 kg)  BMI 38.00 kg/m2 Well nourished, well developed, in no acute distress HEENT: normal, [place]/AT, EOMI Neck: no JVD, normal carotid upstroke, no bruit Cardiac:  normal S1, S2; RRR; 2/6 SM RUSB Lungs:  clear to auscultation bilaterally, no wheezing, rhonchi or rales Abd: soft, nontender, no hepatomegaly,  no bruits Ext: no edema, 2+ distal pulses, overweight.  Skin: warm and dry GU: deferred Neuro: no focal abnormalities noted, AAO x 3  EKG:   Today 01/05/15-sinus rhythm, 65, borderline LVH, no other abnormalities. Personally viewed-prior 01/06/14-sinus rhythm, 89, old inferior infarct pattern. No change from prior.  Nuclear stress test 2009-low risk, no ischemia   ECHO: 2015 - Left ventricle: The cavity size was normal. Wall thickness was normal. Systolic function was normal. The estimated ejection fraction was in the range of 60% to 65%. Doppler parameters are consistent with abnormal left ventricular relaxation (grade 1 diastolic dysfunction). - Aortic valve: AV is thickened, calcified with mildly restricted motion. Peak and mean gradients through the valve are 19 and 10 mm Hg respectively. - Mitral valve: There was mild regurgitation. - Left atrium: The atrium was mildly dilated.  LABS: 2014 -  LDL 110, TSH normal, Hg 15, Creat 0.7  ASSESSMENT AND PLAN:  1. Aortic stenosis-previously very mild several. Murmur appreciated. Last echocardiogram 01/13/14-continues to be mild. No further echocardiogram at this time, we may consider a few years down the road. 2. Hypertension-multidrug regimen.  130-180 SBP. Doubled her Hyzaar prior. 100/25. Continue with amlodipine. 90 day supply. She is no longer on meloxicam. Previous doctor's visits her systolic was in the 536 range. 3. Family history coronary artery disease-Brother, father, nephew. Aggressive primary prevention, blood pressure control. 4. Listed in her past medical history from Dr. Drema Dallas. Currently on no medications, diet controlled. 5. Obesity-continue to encourage weight loss. 6. One year followup  Signed, Candee Furbish, MD [facility]  01/05/2015 8:45 AM

## 2015-01-05 NOTE — Patient Instructions (Addendum)
Medication Instructions:  Your physician recommends that you continue on your current medications as directed. Please refer to the Current Medication list given to you today.  Follow-Up: Follow up in 1 year with Dr. Skains.  You will receive a letter in the mail 2 months before you are due.  Please call us when you receive this letter to schedule your follow up appointment.  Thank you for choosing [facility]!!       

## 2015-01-16 DIAGNOSIS — W101XXA Fall (on)(from) sidewalk curb, initial encounter: Secondary | ICD-10-CM | POA: Diagnosis not present

## 2015-01-16 DIAGNOSIS — M542 Cervicalgia: Secondary | ICD-10-CM | POA: Insufficient documentation

## 2015-01-16 DIAGNOSIS — M5412 Radiculopathy, cervical region: Secondary | ICD-10-CM | POA: Diagnosis not present

## 2015-01-18 DIAGNOSIS — M542 Cervicalgia: Secondary | ICD-10-CM | POA: Diagnosis not present

## 2015-01-18 DIAGNOSIS — W01198D Fall on same level from slipping, tripping and stumbling with subsequent striking against other object, subsequent encounter: Secondary | ICD-10-CM | POA: Diagnosis not present

## 2015-01-18 DIAGNOSIS — R2681 Unsteadiness on feet: Secondary | ICD-10-CM | POA: Diagnosis not present

## 2015-01-18 DIAGNOSIS — R293 Abnormal posture: Secondary | ICD-10-CM | POA: Diagnosis not present

## 2015-01-20 DIAGNOSIS — R2681 Unsteadiness on feet: Secondary | ICD-10-CM | POA: Diagnosis not present

## 2015-01-20 DIAGNOSIS — W01198D Fall on same level from slipping, tripping and stumbling with subsequent striking against other object, subsequent encounter: Secondary | ICD-10-CM | POA: Diagnosis not present

## 2015-01-20 DIAGNOSIS — M542 Cervicalgia: Secondary | ICD-10-CM | POA: Diagnosis not present

## 2015-01-20 DIAGNOSIS — R293 Abnormal posture: Secondary | ICD-10-CM | POA: Diagnosis not present

## 2015-01-25 DIAGNOSIS — R2681 Unsteadiness on feet: Secondary | ICD-10-CM | POA: Diagnosis not present

## 2015-01-25 DIAGNOSIS — W01198D Fall on same level from slipping, tripping and stumbling with subsequent striking against other object, subsequent encounter: Secondary | ICD-10-CM | POA: Diagnosis not present

## 2015-01-25 DIAGNOSIS — M542 Cervicalgia: Secondary | ICD-10-CM | POA: Diagnosis not present

## 2015-01-25 DIAGNOSIS — R293 Abnormal posture: Secondary | ICD-10-CM | POA: Diagnosis not present

## 2015-01-27 DIAGNOSIS — W01198D Fall on same level from slipping, tripping and stumbling with subsequent striking against other object, subsequent encounter: Secondary | ICD-10-CM | POA: Diagnosis not present

## 2015-01-27 DIAGNOSIS — M542 Cervicalgia: Secondary | ICD-10-CM | POA: Diagnosis not present

## 2015-01-27 DIAGNOSIS — R2681 Unsteadiness on feet: Secondary | ICD-10-CM | POA: Diagnosis not present

## 2015-01-27 DIAGNOSIS — R293 Abnormal posture: Secondary | ICD-10-CM | POA: Diagnosis not present

## 2015-01-30 DIAGNOSIS — M542 Cervicalgia: Secondary | ICD-10-CM | POA: Diagnosis not present

## 2015-01-30 DIAGNOSIS — R2681 Unsteadiness on feet: Secondary | ICD-10-CM | POA: Diagnosis not present

## 2015-01-30 DIAGNOSIS — W01198D Fall on same level from slipping, tripping and stumbling with subsequent striking against other object, subsequent encounter: Secondary | ICD-10-CM | POA: Diagnosis not present

## 2015-01-30 DIAGNOSIS — R293 Abnormal posture: Secondary | ICD-10-CM | POA: Diagnosis not present

## 2015-02-01 DIAGNOSIS — W01198D Fall on same level from slipping, tripping and stumbling with subsequent striking against other object, subsequent encounter: Secondary | ICD-10-CM | POA: Diagnosis not present

## 2015-02-01 DIAGNOSIS — R293 Abnormal posture: Secondary | ICD-10-CM | POA: Diagnosis not present

## 2015-02-01 DIAGNOSIS — M542 Cervicalgia: Secondary | ICD-10-CM | POA: Diagnosis not present

## 2015-02-01 DIAGNOSIS — R2681 Unsteadiness on feet: Secondary | ICD-10-CM | POA: Diagnosis not present

## 2015-02-06 DIAGNOSIS — W01198D Fall on same level from slipping, tripping and stumbling with subsequent striking against other object, subsequent encounter: Secondary | ICD-10-CM | POA: Diagnosis not present

## 2015-02-06 DIAGNOSIS — R2681 Unsteadiness on feet: Secondary | ICD-10-CM | POA: Diagnosis not present

## 2015-02-06 DIAGNOSIS — R293 Abnormal posture: Secondary | ICD-10-CM | POA: Diagnosis not present

## 2015-02-06 DIAGNOSIS — M542 Cervicalgia: Secondary | ICD-10-CM | POA: Diagnosis not present

## 2015-02-08 DIAGNOSIS — R296 Repeated falls: Secondary | ICD-10-CM | POA: Diagnosis not present

## 2015-02-08 DIAGNOSIS — L4050 Arthropathic psoriasis, unspecified: Secondary | ICD-10-CM | POA: Diagnosis not present

## 2015-02-08 DIAGNOSIS — Z79899 Other long term (current) drug therapy: Secondary | ICD-10-CM | POA: Diagnosis not present

## 2015-02-09 DIAGNOSIS — R2681 Unsteadiness on feet: Secondary | ICD-10-CM | POA: Diagnosis not present

## 2015-02-09 DIAGNOSIS — M542 Cervicalgia: Secondary | ICD-10-CM | POA: Diagnosis not present

## 2015-02-09 DIAGNOSIS — W01198D Fall on same level from slipping, tripping and stumbling with subsequent striking against other object, subsequent encounter: Secondary | ICD-10-CM | POA: Diagnosis not present

## 2015-02-09 DIAGNOSIS — R293 Abnormal posture: Secondary | ICD-10-CM | POA: Diagnosis not present

## 2015-02-13 DIAGNOSIS — W01198D Fall on same level from slipping, tripping and stumbling with subsequent striking against other object, subsequent encounter: Secondary | ICD-10-CM | POA: Diagnosis not present

## 2015-02-13 DIAGNOSIS — R293 Abnormal posture: Secondary | ICD-10-CM | POA: Diagnosis not present

## 2015-02-13 DIAGNOSIS — R2681 Unsteadiness on feet: Secondary | ICD-10-CM | POA: Diagnosis not present

## 2015-02-13 DIAGNOSIS — M542 Cervicalgia: Secondary | ICD-10-CM | POA: Diagnosis not present

## 2015-02-16 DIAGNOSIS — W01198D Fall on same level from slipping, tripping and stumbling with subsequent striking against other object, subsequent encounter: Secondary | ICD-10-CM | POA: Diagnosis not present

## 2015-02-16 DIAGNOSIS — M542 Cervicalgia: Secondary | ICD-10-CM | POA: Diagnosis not present

## 2015-02-16 DIAGNOSIS — R2681 Unsteadiness on feet: Secondary | ICD-10-CM | POA: Diagnosis not present

## 2015-02-16 DIAGNOSIS — R293 Abnormal posture: Secondary | ICD-10-CM | POA: Diagnosis not present

## 2015-02-22 DIAGNOSIS — R2681 Unsteadiness on feet: Secondary | ICD-10-CM | POA: Diagnosis not present

## 2015-02-22 DIAGNOSIS — M542 Cervicalgia: Secondary | ICD-10-CM | POA: Diagnosis not present

## 2015-02-22 DIAGNOSIS — R293 Abnormal posture: Secondary | ICD-10-CM | POA: Diagnosis not present

## 2015-02-22 DIAGNOSIS — W01198D Fall on same level from slipping, tripping and stumbling with subsequent striking against other object, subsequent encounter: Secondary | ICD-10-CM | POA: Diagnosis not present

## 2015-02-24 DIAGNOSIS — Z87310 Personal history of (healed) osteoporosis fracture: Secondary | ICD-10-CM | POA: Diagnosis not present

## 2015-02-24 DIAGNOSIS — Z1231 Encounter for screening mammogram for malignant neoplasm of breast: Secondary | ICD-10-CM | POA: Diagnosis not present

## 2015-02-24 DIAGNOSIS — Z8262 Family history of osteoporosis: Secondary | ICD-10-CM | POA: Diagnosis not present

## 2015-03-03 DIAGNOSIS — H5213 Myopia, bilateral: Secondary | ICD-10-CM | POA: Diagnosis not present

## 2015-03-03 DIAGNOSIS — H10433 Chronic follicular conjunctivitis, bilateral: Secondary | ICD-10-CM | POA: Diagnosis not present

## 2015-03-03 DIAGNOSIS — H35363 Drusen (degenerative) of macula, bilateral: Secondary | ICD-10-CM | POA: Diagnosis not present

## 2015-03-08 DIAGNOSIS — M5416 Radiculopathy, lumbar region: Secondary | ICD-10-CM | POA: Diagnosis not present

## 2015-03-08 DIAGNOSIS — W101XXA Fall (on)(from) sidewalk curb, initial encounter: Secondary | ICD-10-CM | POA: Diagnosis not present

## 2015-03-08 DIAGNOSIS — M542 Cervicalgia: Secondary | ICD-10-CM | POA: Diagnosis not present

## 2015-03-08 DIAGNOSIS — M545 Low back pain: Secondary | ICD-10-CM | POA: Diagnosis not present

## 2015-03-08 DIAGNOSIS — M5412 Radiculopathy, cervical region: Secondary | ICD-10-CM | POA: Diagnosis not present

## 2015-03-09 DIAGNOSIS — R300 Dysuria: Secondary | ICD-10-CM | POA: Diagnosis not present

## 2015-03-09 DIAGNOSIS — M5136 Other intervertebral disc degeneration, lumbar region: Secondary | ICD-10-CM | POA: Diagnosis not present

## 2015-03-09 DIAGNOSIS — N898 Other specified noninflammatory disorders of vagina: Secondary | ICD-10-CM | POA: Diagnosis not present

## 2015-03-09 DIAGNOSIS — M545 Low back pain: Secondary | ICD-10-CM | POA: Diagnosis not present

## 2015-03-09 DIAGNOSIS — N9089 Other specified noninflammatory disorders of vulva and perineum: Secondary | ICD-10-CM | POA: Diagnosis not present

## 2015-03-09 DIAGNOSIS — M5126 Other intervertebral disc displacement, lumbar region: Secondary | ICD-10-CM | POA: Diagnosis not present

## 2015-03-09 DIAGNOSIS — R293 Abnormal posture: Secondary | ICD-10-CM | POA: Diagnosis not present

## 2015-03-13 DIAGNOSIS — M545 Low back pain: Secondary | ICD-10-CM | POA: Diagnosis not present

## 2015-03-13 DIAGNOSIS — M5136 Other intervertebral disc degeneration, lumbar region: Secondary | ICD-10-CM | POA: Diagnosis not present

## 2015-03-13 DIAGNOSIS — M5126 Other intervertebral disc displacement, lumbar region: Secondary | ICD-10-CM | POA: Diagnosis not present

## 2015-03-13 DIAGNOSIS — R293 Abnormal posture: Secondary | ICD-10-CM | POA: Diagnosis not present

## 2015-03-15 DIAGNOSIS — M545 Low back pain: Secondary | ICD-10-CM | POA: Diagnosis not present

## 2015-03-15 DIAGNOSIS — M5136 Other intervertebral disc degeneration, lumbar region: Secondary | ICD-10-CM | POA: Diagnosis not present

## 2015-03-15 DIAGNOSIS — R293 Abnormal posture: Secondary | ICD-10-CM | POA: Diagnosis not present

## 2015-03-15 DIAGNOSIS — M5126 Other intervertebral disc displacement, lumbar region: Secondary | ICD-10-CM | POA: Diagnosis not present

## 2015-03-20 DIAGNOSIS — M5126 Other intervertebral disc displacement, lumbar region: Secondary | ICD-10-CM | POA: Diagnosis not present

## 2015-03-20 DIAGNOSIS — M545 Low back pain: Secondary | ICD-10-CM | POA: Diagnosis not present

## 2015-03-20 DIAGNOSIS — R293 Abnormal posture: Secondary | ICD-10-CM | POA: Diagnosis not present

## 2015-03-20 DIAGNOSIS — M5136 Other intervertebral disc degeneration, lumbar region: Secondary | ICD-10-CM | POA: Diagnosis not present

## 2015-03-22 DIAGNOSIS — R293 Abnormal posture: Secondary | ICD-10-CM | POA: Diagnosis not present

## 2015-03-22 DIAGNOSIS — M5126 Other intervertebral disc displacement, lumbar region: Secondary | ICD-10-CM | POA: Diagnosis not present

## 2015-03-22 DIAGNOSIS — M545 Low back pain: Secondary | ICD-10-CM | POA: Diagnosis not present

## 2015-03-22 DIAGNOSIS — M5136 Other intervertebral disc degeneration, lumbar region: Secondary | ICD-10-CM | POA: Diagnosis not present

## 2015-03-28 DIAGNOSIS — M5136 Other intervertebral disc degeneration, lumbar region: Secondary | ICD-10-CM | POA: Diagnosis not present

## 2015-03-28 DIAGNOSIS — R293 Abnormal posture: Secondary | ICD-10-CM | POA: Diagnosis not present

## 2015-03-28 DIAGNOSIS — M545 Low back pain: Secondary | ICD-10-CM | POA: Diagnosis not present

## 2015-03-28 DIAGNOSIS — M5126 Other intervertebral disc displacement, lumbar region: Secondary | ICD-10-CM | POA: Diagnosis not present

## 2015-03-29 DIAGNOSIS — Z79899 Other long term (current) drug therapy: Secondary | ICD-10-CM | POA: Diagnosis not present

## 2015-03-29 DIAGNOSIS — L4050 Arthropathic psoriasis, unspecified: Secondary | ICD-10-CM | POA: Diagnosis not present

## 2015-03-30 DIAGNOSIS — M5136 Other intervertebral disc degeneration, lumbar region: Secondary | ICD-10-CM | POA: Diagnosis not present

## 2015-03-30 DIAGNOSIS — M5126 Other intervertebral disc displacement, lumbar region: Secondary | ICD-10-CM | POA: Diagnosis not present

## 2015-03-30 DIAGNOSIS — R293 Abnormal posture: Secondary | ICD-10-CM | POA: Diagnosis not present

## 2015-03-30 DIAGNOSIS — M545 Low back pain: Secondary | ICD-10-CM | POA: Diagnosis not present

## 2015-04-03 DIAGNOSIS — M5136 Other intervertebral disc degeneration, lumbar region: Secondary | ICD-10-CM | POA: Diagnosis not present

## 2015-04-03 DIAGNOSIS — M5126 Other intervertebral disc displacement, lumbar region: Secondary | ICD-10-CM | POA: Diagnosis not present

## 2015-04-03 DIAGNOSIS — R293 Abnormal posture: Secondary | ICD-10-CM | POA: Diagnosis not present

## 2015-04-03 DIAGNOSIS — M545 Low back pain: Secondary | ICD-10-CM | POA: Diagnosis not present

## 2015-04-05 DIAGNOSIS — M5126 Other intervertebral disc displacement, lumbar region: Secondary | ICD-10-CM | POA: Diagnosis not present

## 2015-04-05 DIAGNOSIS — M545 Low back pain: Secondary | ICD-10-CM | POA: Diagnosis not present

## 2015-04-05 DIAGNOSIS — R293 Abnormal posture: Secondary | ICD-10-CM | POA: Diagnosis not present

## 2015-04-05 DIAGNOSIS — M5136 Other intervertebral disc degeneration, lumbar region: Secondary | ICD-10-CM | POA: Diagnosis not present

## 2015-04-10 DIAGNOSIS — M545 Low back pain: Secondary | ICD-10-CM | POA: Diagnosis not present

## 2015-04-10 DIAGNOSIS — M5126 Other intervertebral disc displacement, lumbar region: Secondary | ICD-10-CM | POA: Diagnosis not present

## 2015-04-10 DIAGNOSIS — R293 Abnormal posture: Secondary | ICD-10-CM | POA: Diagnosis not present

## 2015-04-10 DIAGNOSIS — M5136 Other intervertebral disc degeneration, lumbar region: Secondary | ICD-10-CM | POA: Diagnosis not present

## 2015-04-14 DIAGNOSIS — M810 Age-related osteoporosis without current pathological fracture: Secondary | ICD-10-CM | POA: Diagnosis not present

## 2015-04-14 DIAGNOSIS — Z23 Encounter for immunization: Secondary | ICD-10-CM | POA: Diagnosis not present

## 2015-04-14 DIAGNOSIS — E049 Nontoxic goiter, unspecified: Secondary | ICD-10-CM | POA: Diagnosis not present

## 2015-04-14 DIAGNOSIS — R7989 Other specified abnormal findings of blood chemistry: Secondary | ICD-10-CM | POA: Diagnosis not present

## 2015-04-14 DIAGNOSIS — E119 Type 2 diabetes mellitus without complications: Secondary | ICD-10-CM | POA: Diagnosis not present

## 2015-04-14 DIAGNOSIS — M130 Polyarthritis, unspecified: Secondary | ICD-10-CM | POA: Diagnosis not present

## 2015-04-14 DIAGNOSIS — E782 Mixed hyperlipidemia: Secondary | ICD-10-CM | POA: Diagnosis not present

## 2015-04-14 DIAGNOSIS — I10 Essential (primary) hypertension: Secondary | ICD-10-CM | POA: Diagnosis not present

## 2015-04-14 DIAGNOSIS — J309 Allergic rhinitis, unspecified: Secondary | ICD-10-CM | POA: Diagnosis not present

## 2015-04-14 DIAGNOSIS — R5383 Other fatigue: Secondary | ICD-10-CM | POA: Diagnosis not present

## 2015-04-14 DIAGNOSIS — I350 Nonrheumatic aortic (valve) stenosis: Secondary | ICD-10-CM | POA: Diagnosis not present

## 2015-04-17 DIAGNOSIS — M5136 Other intervertebral disc degeneration, lumbar region: Secondary | ICD-10-CM | POA: Diagnosis not present

## 2015-04-17 DIAGNOSIS — M5126 Other intervertebral disc displacement, lumbar region: Secondary | ICD-10-CM | POA: Diagnosis not present

## 2015-04-17 DIAGNOSIS — M545 Low back pain: Secondary | ICD-10-CM | POA: Diagnosis not present

## 2015-04-17 DIAGNOSIS — R293 Abnormal posture: Secondary | ICD-10-CM | POA: Diagnosis not present

## 2015-04-24 DIAGNOSIS — E010 Iodine-deficiency related diffuse (endemic) goiter: Secondary | ICD-10-CM | POA: Diagnosis not present

## 2015-04-24 DIAGNOSIS — H6591 Unspecified nonsuppurative otitis media, right ear: Secondary | ICD-10-CM | POA: Diagnosis not present

## 2015-04-30 DIAGNOSIS — M19071 Primary osteoarthritis, right ankle and foot: Secondary | ICD-10-CM | POA: Diagnosis not present

## 2015-05-10 DIAGNOSIS — R799 Abnormal finding of blood chemistry, unspecified: Secondary | ICD-10-CM | POA: Diagnosis not present

## 2015-05-10 DIAGNOSIS — Z79899 Other long term (current) drug therapy: Secondary | ICD-10-CM | POA: Diagnosis not present

## 2015-05-10 DIAGNOSIS — L4050 Arthropathic psoriasis, unspecified: Secondary | ICD-10-CM | POA: Diagnosis not present

## 2015-06-06 DIAGNOSIS — M25441 Effusion, right hand: Secondary | ICD-10-CM | POA: Diagnosis not present

## 2015-06-06 DIAGNOSIS — Z79899 Other long term (current) drug therapy: Secondary | ICD-10-CM | POA: Diagnosis not present

## 2015-06-06 DIAGNOSIS — M25571 Pain in right ankle and joints of right foot: Secondary | ICD-10-CM | POA: Diagnosis not present

## 2015-06-06 DIAGNOSIS — M25442 Effusion, left hand: Secondary | ICD-10-CM | POA: Diagnosis not present

## 2015-06-06 DIAGNOSIS — L4050 Arthropathic psoriasis, unspecified: Secondary | ICD-10-CM | POA: Diagnosis not present

## 2015-06-06 DIAGNOSIS — M7989 Other specified soft tissue disorders: Secondary | ICD-10-CM | POA: Diagnosis not present

## 2015-06-12 DIAGNOSIS — L4050 Arthropathic psoriasis, unspecified: Secondary | ICD-10-CM | POA: Diagnosis not present

## 2015-06-12 DIAGNOSIS — Z79899 Other long term (current) drug therapy: Secondary | ICD-10-CM | POA: Diagnosis not present

## 2015-07-03 DIAGNOSIS — L409 Psoriasis, unspecified: Secondary | ICD-10-CM | POA: Diagnosis not present

## 2015-07-03 DIAGNOSIS — H903 Sensorineural hearing loss, bilateral: Secondary | ICD-10-CM | POA: Diagnosis not present

## 2015-07-03 DIAGNOSIS — D447 Neoplasm of uncertain behavior of aortic body and other paraganglia: Secondary | ICD-10-CM | POA: Diagnosis not present

## 2015-07-10 DIAGNOSIS — Z1389 Encounter for screening for other disorder: Secondary | ICD-10-CM | POA: Diagnosis not present

## 2015-07-10 DIAGNOSIS — E119 Type 2 diabetes mellitus without complications: Secondary | ICD-10-CM | POA: Diagnosis not present

## 2015-07-10 DIAGNOSIS — L989 Disorder of the skin and subcutaneous tissue, unspecified: Secondary | ICD-10-CM | POA: Diagnosis not present

## 2015-07-10 DIAGNOSIS — I10 Essential (primary) hypertension: Secondary | ICD-10-CM | POA: Diagnosis not present

## 2015-07-10 DIAGNOSIS — E559 Vitamin D deficiency, unspecified: Secondary | ICD-10-CM | POA: Diagnosis not present

## 2015-07-10 DIAGNOSIS — E782 Mixed hyperlipidemia: Secondary | ICD-10-CM | POA: Diagnosis not present

## 2015-07-19 DIAGNOSIS — E782 Mixed hyperlipidemia: Secondary | ICD-10-CM | POA: Diagnosis not present

## 2015-07-19 DIAGNOSIS — E559 Vitamin D deficiency, unspecified: Secondary | ICD-10-CM | POA: Diagnosis not present

## 2015-07-19 DIAGNOSIS — Z1389 Encounter for screening for other disorder: Secondary | ICD-10-CM | POA: Diagnosis not present

## 2015-07-19 DIAGNOSIS — I10 Essential (primary) hypertension: Secondary | ICD-10-CM | POA: Diagnosis not present

## 2015-07-19 DIAGNOSIS — E119 Type 2 diabetes mellitus without complications: Secondary | ICD-10-CM | POA: Diagnosis not present

## 2015-07-27 DIAGNOSIS — D447 Neoplasm of uncertain behavior of aortic body and other paraganglia: Secondary | ICD-10-CM | POA: Diagnosis not present

## 2015-08-03 DIAGNOSIS — B078 Other viral warts: Secondary | ICD-10-CM | POA: Diagnosis not present

## 2015-08-03 DIAGNOSIS — L821 Other seborrheic keratosis: Secondary | ICD-10-CM | POA: Diagnosis not present

## 2015-08-03 DIAGNOSIS — M79672 Pain in left foot: Secondary | ICD-10-CM | POA: Diagnosis not present

## 2015-08-03 DIAGNOSIS — L4050 Arthropathic psoriasis, unspecified: Secondary | ICD-10-CM | POA: Diagnosis not present

## 2015-08-03 DIAGNOSIS — D485 Neoplasm of uncertain behavior of skin: Secondary | ICD-10-CM | POA: Diagnosis not present

## 2015-08-03 DIAGNOSIS — R7989 Other specified abnormal findings of blood chemistry: Secondary | ICD-10-CM | POA: Diagnosis not present

## 2015-08-03 DIAGNOSIS — Z79899 Other long term (current) drug therapy: Secondary | ICD-10-CM | POA: Diagnosis not present

## 2015-08-03 DIAGNOSIS — L408 Other psoriasis: Secondary | ICD-10-CM | POA: Diagnosis not present

## 2015-08-03 DIAGNOSIS — M79671 Pain in right foot: Secondary | ICD-10-CM | POA: Diagnosis not present

## 2015-08-08 DIAGNOSIS — D447 Neoplasm of uncertain behavior of aortic body and other paraganglia: Secondary | ICD-10-CM | POA: Diagnosis not present

## 2015-08-08 DIAGNOSIS — H905 Unspecified sensorineural hearing loss: Secondary | ICD-10-CM | POA: Diagnosis not present

## 2015-08-08 DIAGNOSIS — Z9181 History of falling: Secondary | ICD-10-CM | POA: Diagnosis not present

## 2015-08-31 DIAGNOSIS — L570 Actinic keratosis: Secondary | ICD-10-CM | POA: Diagnosis not present

## 2015-09-11 DIAGNOSIS — L409 Psoriasis, unspecified: Secondary | ICD-10-CM | POA: Diagnosis not present

## 2015-09-11 DIAGNOSIS — Z23 Encounter for immunization: Secondary | ICD-10-CM | POA: Diagnosis not present

## 2015-09-11 DIAGNOSIS — D1801 Hemangioma of skin and subcutaneous tissue: Secondary | ICD-10-CM | POA: Diagnosis not present

## 2015-09-11 DIAGNOSIS — L821 Other seborrheic keratosis: Secondary | ICD-10-CM | POA: Diagnosis not present

## 2015-09-11 DIAGNOSIS — L304 Erythema intertrigo: Secondary | ICD-10-CM | POA: Diagnosis not present

## 2015-09-12 DIAGNOSIS — Z79899 Other long term (current) drug therapy: Secondary | ICD-10-CM | POA: Diagnosis not present

## 2015-09-12 DIAGNOSIS — L4050 Arthropathic psoriasis, unspecified: Secondary | ICD-10-CM | POA: Diagnosis not present

## 2015-10-06 DIAGNOSIS — N952 Postmenopausal atrophic vaginitis: Secondary | ICD-10-CM | POA: Diagnosis not present

## 2015-10-06 DIAGNOSIS — Z01411 Encounter for gynecological examination (general) (routine) with abnormal findings: Secondary | ICD-10-CM | POA: Diagnosis not present

## 2015-11-09 DIAGNOSIS — M79672 Pain in left foot: Secondary | ICD-10-CM | POA: Diagnosis not present

## 2015-11-09 DIAGNOSIS — L4050 Arthropathic psoriasis, unspecified: Secondary | ICD-10-CM | POA: Diagnosis not present

## 2015-11-09 DIAGNOSIS — R7989 Other specified abnormal findings of blood chemistry: Secondary | ICD-10-CM | POA: Diagnosis not present

## 2015-11-09 DIAGNOSIS — M79671 Pain in right foot: Secondary | ICD-10-CM | POA: Diagnosis not present

## 2015-11-15 DIAGNOSIS — L0291 Cutaneous abscess, unspecified: Secondary | ICD-10-CM | POA: Diagnosis not present

## 2015-11-15 DIAGNOSIS — L02214 Cutaneous abscess of groin: Secondary | ICD-10-CM | POA: Diagnosis not present

## 2015-11-16 DIAGNOSIS — L03314 Cellulitis of groin: Secondary | ICD-10-CM | POA: Diagnosis not present

## 2015-11-21 ENCOUNTER — Ambulatory Visit (INDEPENDENT_AMBULATORY_CARE_PROVIDER_SITE_OTHER): Payer: Medicare Other

## 2015-11-21 ENCOUNTER — Encounter: Payer: Self-pay | Admitting: Podiatry

## 2015-11-21 ENCOUNTER — Ambulatory Visit (INDEPENDENT_AMBULATORY_CARE_PROVIDER_SITE_OTHER): Payer: Medicare Other | Admitting: Podiatry

## 2015-11-21 VITALS — BP 136/64 | HR 72 | Resp 16

## 2015-11-21 DIAGNOSIS — L4050 Arthropathic psoriasis, unspecified: Secondary | ICD-10-CM

## 2015-11-21 DIAGNOSIS — G629 Polyneuropathy, unspecified: Secondary | ICD-10-CM

## 2015-11-21 DIAGNOSIS — R7989 Other specified abnormal findings of blood chemistry: Secondary | ICD-10-CM | POA: Diagnosis not present

## 2015-11-21 DIAGNOSIS — M79672 Pain in left foot: Secondary | ICD-10-CM | POA: Diagnosis not present

## 2015-11-21 DIAGNOSIS — M79673 Pain in unspecified foot: Secondary | ICD-10-CM

## 2015-11-21 DIAGNOSIS — M79671 Pain in right foot: Secondary | ICD-10-CM | POA: Diagnosis not present

## 2015-11-21 MED ORDER — GABAPENTIN 100 MG PO CAPS: 100.0000 mg | ORAL_CAPSULE | Freq: Every day | ORAL | Status: DC

## 2015-11-21 NOTE — Progress Notes (Signed)
   Subjective:    Patient ID: Lisa [name], female    DOB: 1936/04/29, 80 y.o.   MRN: [id number]  HPI: She presents today with a chief complaint of burning tingling numbness and pain not only the distal aspect of the toes but also the forefoot and midfoot. She states that she sees a rheumatologist who had put her on methotrexate and high doses of prednisone at one time. She states that that made her feet feel much better but now that she is no longer taking methotrexate regularly he is requesting to use biologicals and she's concerned.    Review of Systems  Constitutional: Positive for activity change.  HENT: Positive for ear pain, hearing loss, sneezing and tinnitus.        Deaf in right ear  Respiratory: Positive for cough.   Musculoskeletal: Positive for myalgias, back pain, arthralgias and gait problem.  All other systems reviewed and are negative.      Objective:   Physical Exam: Vital signs are stable she is alert and oriented 3. Pulses are palpable. Neurologic sensorium is intact. Deep tendon reflex are intact. Muscle strength is normal bilateral. Orthopedic evaluation does rates all joints distal to the ankle for range of motion without crepitation. She has pain all frontal plane range of motion of the forefoot and on range of motion of the toes bilaterally. Radiographs do demonstrate significant osteoarthritic changes or psoriatic changes of the midfoot and forefoot. Cutaneous evaluation of a straight supple well-hydrated cutis no erythema edema cellulitis drainage or odor.        Assessment & Plan:  Assessment probable neuropathy and osteoarthritis/psoriatic arthritis.  Plan: At this point I started her on gabapentin 100 mg to be taking at bedtime I will follow-up with her in 1 month for med check.

## 2015-11-22 ENCOUNTER — Telehealth: Payer: Self-pay | Admitting: *Deleted

## 2015-11-22 MED ORDER — GABAPENTIN 100 MG PO CAPS: 100.0000 mg | ORAL_CAPSULE | Freq: Every day | ORAL | Status: DC

## 2015-11-22 NOTE — Telephone Encounter (Signed)
Pt states the medication discussed with Dr. Milinda Pointer on 11/21/2015 was not called to the [facility] on [address].  I reviewed 11/21/2015 clinicals and medication orders and the Gabapentin 100mg  was sent to [facility] on [address].  I left message informing pt, that the medication was called to the wrong pharmacy and I had change to the pharmacy she had requested, and apologized for the delay.

## 2015-12-21 ENCOUNTER — Ambulatory Visit: Payer: Medicare Other | Admitting: Podiatry

## 2015-12-26 DIAGNOSIS — R7989 Other specified abnormal findings of blood chemistry: Secondary | ICD-10-CM | POA: Diagnosis not present

## 2015-12-26 DIAGNOSIS — L4050 Arthropathic psoriasis, unspecified: Secondary | ICD-10-CM | POA: Diagnosis not present

## 2016-01-05 ENCOUNTER — Other Ambulatory Visit: Payer: Self-pay | Admitting: Cardiology

## 2016-01-05 ENCOUNTER — Ambulatory Visit: Payer: Medicare Other | Admitting: Cardiology

## 2016-01-05 ENCOUNTER — Encounter: Payer: Self-pay | Admitting: Cardiology

## 2016-01-05 ENCOUNTER — Ambulatory Visit (INDEPENDENT_AMBULATORY_CARE_PROVIDER_SITE_OTHER): Payer: Medicare Other | Admitting: Cardiology

## 2016-01-05 VITALS — BP 136/48 | HR 74 | Ht 61.0 in | Wt 202.6 lb

## 2016-01-05 DIAGNOSIS — I350 Nonrheumatic aortic (valve) stenosis: Secondary | ICD-10-CM | POA: Diagnosis not present

## 2016-01-05 DIAGNOSIS — I10 Essential (primary) hypertension: Secondary | ICD-10-CM

## 2016-01-05 DIAGNOSIS — Z8249 Family history of ischemic heart disease and other diseases of the circulatory system: Secondary | ICD-10-CM

## 2016-01-05 NOTE — Patient Instructions (Signed)

## 2016-01-05 NOTE — Progress Notes (Signed)
[place]. [address] [place], [place]  [address] Phone: [phone] Fax:  [phone]  Date:  01/05/2016   ID:  Lisa [name], DOB [birth year], MRN [id number]  PCP:  Gerrit Heck, MD   History of Present Illness: Lisa [name] is a 80 y.o. female with hypertension, obesity, borderline diabetes, family history of CAD here for follow up visit. 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 This is currently the size of a thumbnail, discovered when she had an MRI to consider implant with Dr. Elwyn Reach. [place] was in 2012. Dr. Danne Baxter. Skipping MRI this year.   Been tired more easily. Am I going to make it. MTX. Headache left-sided at one point. [employer]. Water aerobics. Losing weight. Not much of an appetite.  Brother had CABG 15 years ago.  We discussed her nuclear stress test from 2009 as well as her echocardiogram and both were reassuring in the sense that there was no evidence of inferior infarction as demonstrated on EKG both today and previously.  Has aortic murmur. Radiation to the carotids. Carotid Doppler was normal. Aortic murmur was assessed in 2015 showed only very mild stenosis. Repeating.    Wt Readings from Last 3 Encounters:  01/05/16 202 lb 9.6 oz (91.899 kg)  01/05/15 207 lb 12.8 oz (94.257 kg)  01/06/14 212 lb (96.163 kg)     Past Medical History  Diagnosis Date  . Arthritis   . Deafness in right ear   . Hearing loss in left ear   . Psoriasis   . Heart murmur   . Diverticulosis   . Kidney stones   . DDD (degenerative disc disease), cervical     Past Surgical History  Procedure Laterality Date  . Appendectomy    . Total abdominal hysterectomy  1979    FIBROIDS   . Bilateral salpingoophorectomy  1996  . Cholecystectomy  2002  . Cataract extraction    . Colonoscopy  2003/2009/2015    Current Outpatient Prescriptions  Medication Sig Dispense Refill  .  acetaminophen (TYLENOL) 325 MG tablet Take 650 mg by mouth every 6 (six) hours as needed for mild pain or moderate pain. 3/4 tabs daily if needed    . amLODipine (NORVASC) 5 MG tablet Take 5 mg by mouth daily.     [name] Kitchen aspirin 81 MG tablet Take 81 mg by mouth as needed for pain.    . Calcium Carbonate-Vit D-Min (CALTRATE PLUS PO) Take 1 tablet by mouth daily.     . Cholecalciferol (VITAMIN D) 2000 units tablet Take 2,000 Units by mouth daily.    . folic acid (FOLVITE) 1 MG tablet Take 1 mg by mouth daily.     [name] Kitchen ketoconazole (NIZORAL) 2 % cream Apply 1 application topically daily as needed for irritation.    [name] Kitchen losartan-hydrochlorothiazide (HYZAAR) 100-25 MG per tablet Take 1 tablet by mouth daily. 90 tablet 3  . methotrexate (RHEUMATREX) 2.5 MG tablet Take 10 mg by mouth 2 (two) times a week.      No current facility-administered medications for this visit.    Allergies:    Allergies  Allergen Reactions  . Lisinopril     Cough   . Penicillins     whelp    Social History:  The patient  reports that she has quit smoking. She does not have any smokeless tobacco history on file. She reports that  she drinks alcohol. She reports that she does not use illicit drugs.   Family History  Problem Relation Age of Onset  . Heart disease Mother   . Heart disease Father   . Heart disease Brother   . Heart disease Paternal Grandfather   . Heart disease Brother     ROS:  Please see the history of present illness.     All other systems reviewed and negative.   PHYSICAL EXAM: VS:  BP 136/48 mmHg  Pulse 74  Ht 5\' 1"  (1.549 m)  Wt 202 lb 9.6 oz (91.899 kg)  BMI 38.30 kg/m2 Well nourished, well developed, in no acute distress HEENT: normal, /AT, EOMI Neck: no JVD, normal carotid upstroke, no bruit Cardiac:  normal S1, S2; RRR; 2/6 SM RUSB Lungs:  clear to auscultation bilaterally, no wheezing, rhonchi or rales Abd: soft, nontender, no hepatomegaly, no bruits Ext: no edema, 2+ distal pulses,  overweight.  Skin: warm and dry GU: deferred Neuro: no focal abnormalities noted, AAO x 3  EKG:   Today 01/05/16 - NSR, PRWP no changes.   01/05/15-sinus rhythm, 65, borderline LVH, no other abnormalities. Personally viewed-prior 01/06/14-sinus rhythm, 89, old inferior infarct pattern. No change from prior.  Nuclear stress test 2009-low risk, no ischemia   ECHO: 2015 - Left ventricle: The cavity size was normal. Wall thickness was normal. Systolic function was normal. The estimated ejection fraction was in the range of 60% to 65%. Doppler parameters are consistent with abnormal left ventricular relaxation (grade 1 diastolic dysfunction). - Aortic valve: AV is thickened, calcified with mildly restricted motion. Peak and mean gradients through the valve are 19 and 10 mm Hg respectively. - Mitral valve: There was mild regurgitation. - Left atrium: The atrium was mildly dilated.  LABS: 2017 -  creatinine normal, ALT slightly elevated at 37  ASSESSMENT AND PLAN:  1. Aortic stenosis-previously very mild several. Murmur appreciated. Last echocardiogram 01/13/14-continues to be mild. Will echo. Fatigue.  2. Hypertension-multidrug regimen.  130-180 SBP. Doubled her Hyzaar prior. 100/25. Continue with amlodipine. 90 day supply. She is no longer on meloxicam. Previous doctor's visits her systolic was in the [id number] range. Blood pressure recordings look good. I think that this one time diastolic 48 is not accurate. 3. Family history coronary artery disease-Brother, father, nephew. Aggressive primary prevention, blood pressure control. 4. Listed in her past medical history from Dr. Drema Dallas. Currently on no medications, diet controlled. 5. Obesity-continue to encourage weight loss. 6. One year followup  Signed, Candee Furbish, MD [facility]  01/05/2016 8:46 AM

## 2016-01-10 DIAGNOSIS — R7989 Other specified abnormal findings of blood chemistry: Secondary | ICD-10-CM | POA: Diagnosis not present

## 2016-01-10 DIAGNOSIS — M79642 Pain in left hand: Secondary | ICD-10-CM | POA: Diagnosis not present

## 2016-01-10 DIAGNOSIS — Z79899 Other long term (current) drug therapy: Secondary | ICD-10-CM | POA: Diagnosis not present

## 2016-01-10 DIAGNOSIS — L4050 Arthropathic psoriasis, unspecified: Secondary | ICD-10-CM | POA: Diagnosis not present

## 2016-01-10 DIAGNOSIS — M79641 Pain in right hand: Secondary | ICD-10-CM | POA: Diagnosis not present

## 2016-01-18 DIAGNOSIS — E559 Vitamin D deficiency, unspecified: Secondary | ICD-10-CM | POA: Diagnosis not present

## 2016-01-18 DIAGNOSIS — M25559 Pain in unspecified hip: Secondary | ICD-10-CM | POA: Diagnosis not present

## 2016-01-18 DIAGNOSIS — I10 Essential (primary) hypertension: Secondary | ICD-10-CM | POA: Diagnosis not present

## 2016-01-18 DIAGNOSIS — E119 Type 2 diabetes mellitus without complications: Secondary | ICD-10-CM | POA: Diagnosis not present

## 2016-01-18 DIAGNOSIS — E782 Mixed hyperlipidemia: Secondary | ICD-10-CM | POA: Diagnosis not present

## 2016-01-25 ENCOUNTER — Other Ambulatory Visit: Payer: Self-pay

## 2016-01-25 ENCOUNTER — Ambulatory Visit (HOSPITAL_COMMUNITY): Payer: Medicare Other | Attending: Internal Medicine

## 2016-01-25 DIAGNOSIS — I119 Hypertensive heart disease without heart failure: Secondary | ICD-10-CM | POA: Diagnosis not present

## 2016-01-25 DIAGNOSIS — I350 Nonrheumatic aortic (valve) stenosis: Secondary | ICD-10-CM | POA: Diagnosis not present

## 2016-01-25 DIAGNOSIS — I351 Nonrheumatic aortic (valve) insufficiency: Secondary | ICD-10-CM | POA: Insufficient documentation

## 2016-01-25 DIAGNOSIS — Z8249 Family history of ischemic heart disease and other diseases of the circulatory system: Secondary | ICD-10-CM | POA: Insufficient documentation

## 2016-01-25 DIAGNOSIS — I071 Rheumatic tricuspid insufficiency: Secondary | ICD-10-CM | POA: Diagnosis not present

## 2016-01-25 DIAGNOSIS — I358 Other nonrheumatic aortic valve disorders: Secondary | ICD-10-CM | POA: Insufficient documentation

## 2016-01-25 LAB — ECHOCARDIOGRAM COMPLETE
AOPV: 0.44 m/s
AV Area VTI index: 0.71 cm2/m2
AV Area VTI: 1.12 cm2
AV Mean grad: 11 mmHg
AV Peak grad: 25 mmHg
AV VEL mean LVOT/AV: 0.5
AV pk vel: 248 cm/s
AVAREAMEANV: 1.27 cm2
AVAREAMEANVIN: 0.67 cm2/m2
AVPHT: 606 ms
CHL CUP AV PEAK INDEX: 0.59
CHL CUP AV VEL: 1.35
CHL CUP DOP CALC LVOT VTI: 26 cm
DOP CAL AO MEAN VELOCITY: 151 cm/s
E decel time: 296 msec
E/e' ratio: 11.69
FS: 37 % (ref 28–44)
IV/PV OW: 1.16
LA diam end sys: 43 mm
LA diam index: 2.26 cm/m2
LA vol A4C: 58 ml
LA vol: 51 mL
LASIZE: 43 mm
LAVOLIN: 26.8 mL/m2
LV E/e'average: 11.69
LV TDI E'LATERAL: 7.35
LV TDI E'MEDIAL: 5.92
LV e' LATERAL: 7.35 cm/s
LVEEMED: 11.69
LVOT area: 2.54 cm2
LVOT diameter: 18 mm
LVOT peak VTI: 0.53 cm
LVOTPV: 109 cm/s
LVOTSV: 66 mL
MV Dec: 296
MV Peak grad: 3 mmHg
MV pk A vel: 102 m/s
MV pk E vel: 85.9 m/s
PW: 11.6 mm — AB (ref 0.6–1.1)
Reg peak vel: 230 cm/s
TRMAXVEL: 230 cm/s
VTI: 49.1 cm
Valve area index: 0.71
Valve area: 1.35 cm2

## 2016-01-26 ENCOUNTER — Telehealth: Payer: Self-pay | Admitting: Cardiology

## 2016-01-26 NOTE — Telephone Encounter (Signed)
Follow Up:     Returning your call, concerning her test results. 

## 2016-01-26 NOTE — Telephone Encounter (Signed)
Echo results reviewed with pt who states understanding.

## 2016-01-27 IMAGING — CR DG FOOT COMPLETE 3+V*R*
3 series · 3 of 3 positions shown · non-contrast
Comparison: None.

CLINICAL DATA: Foot pain.

EXAM:
RIGHT FOOT COMPLETE - 3+ VIEW

[view not recorded (1 of 3)]
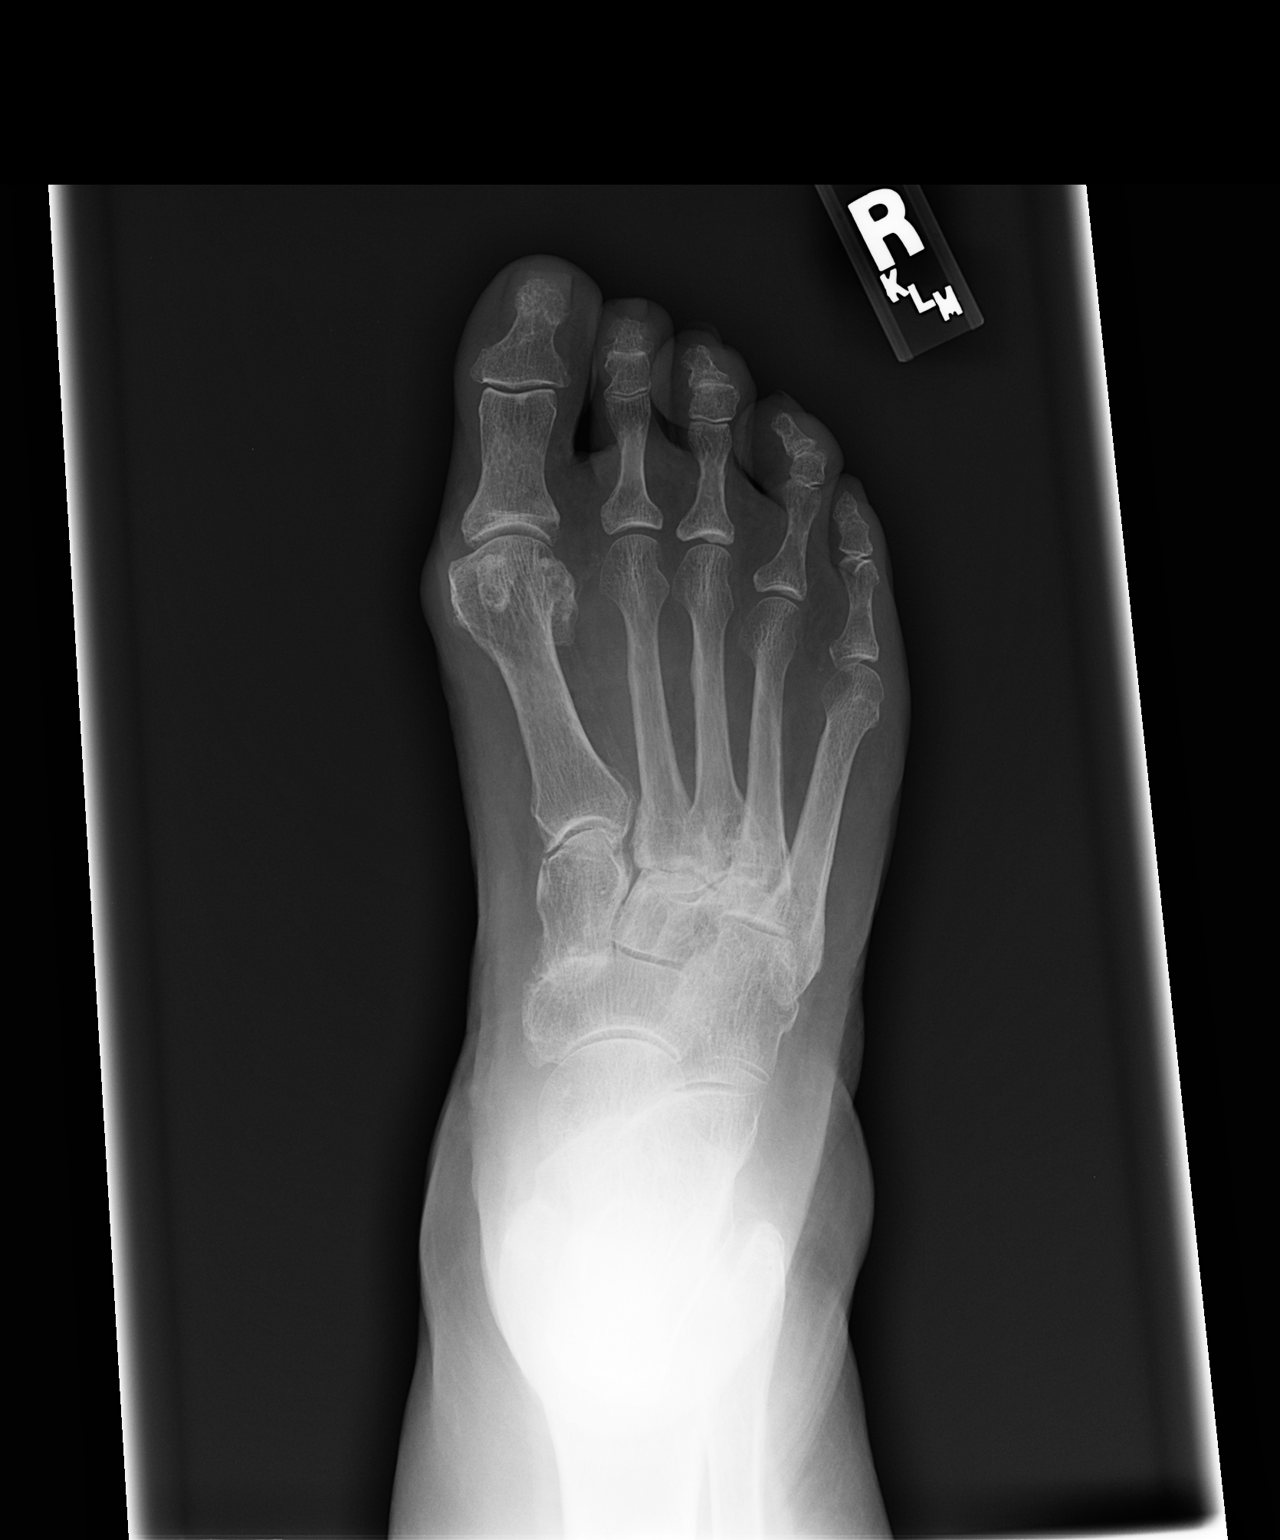

[view not recorded (2 of 3)]
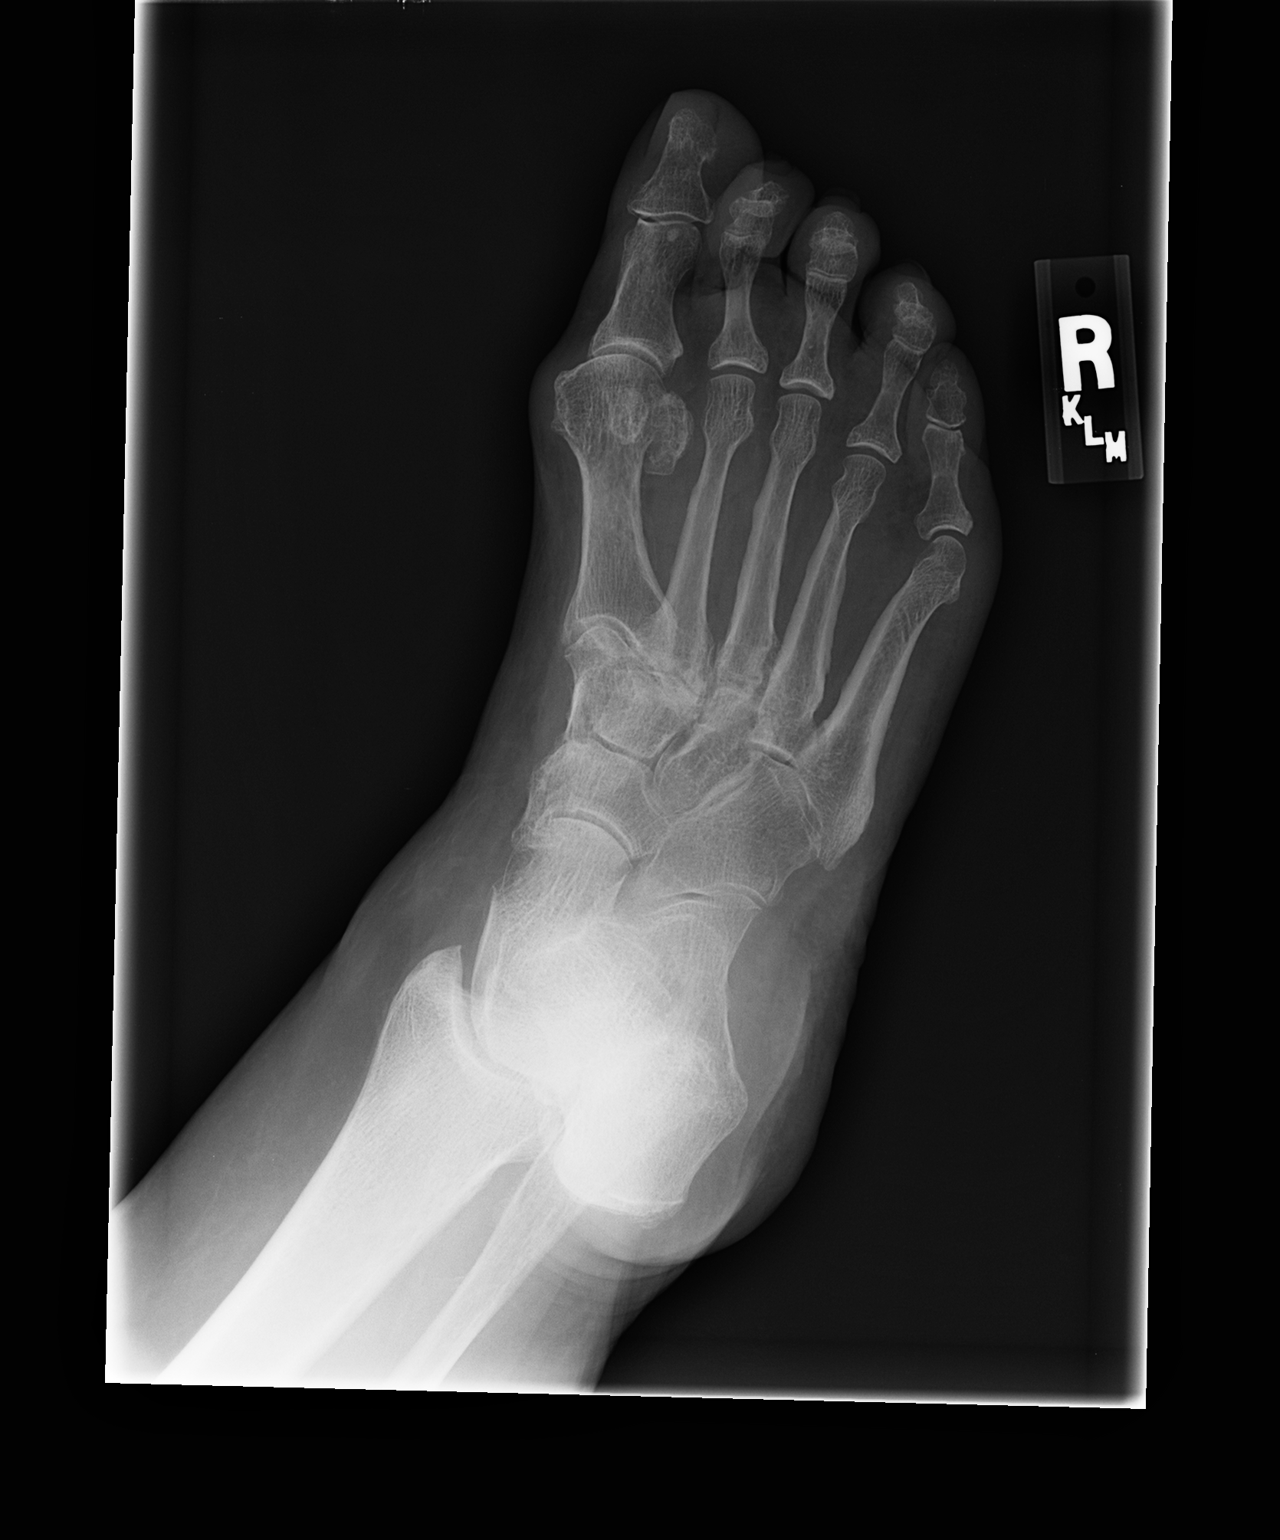

[view not recorded (3 of 3)]
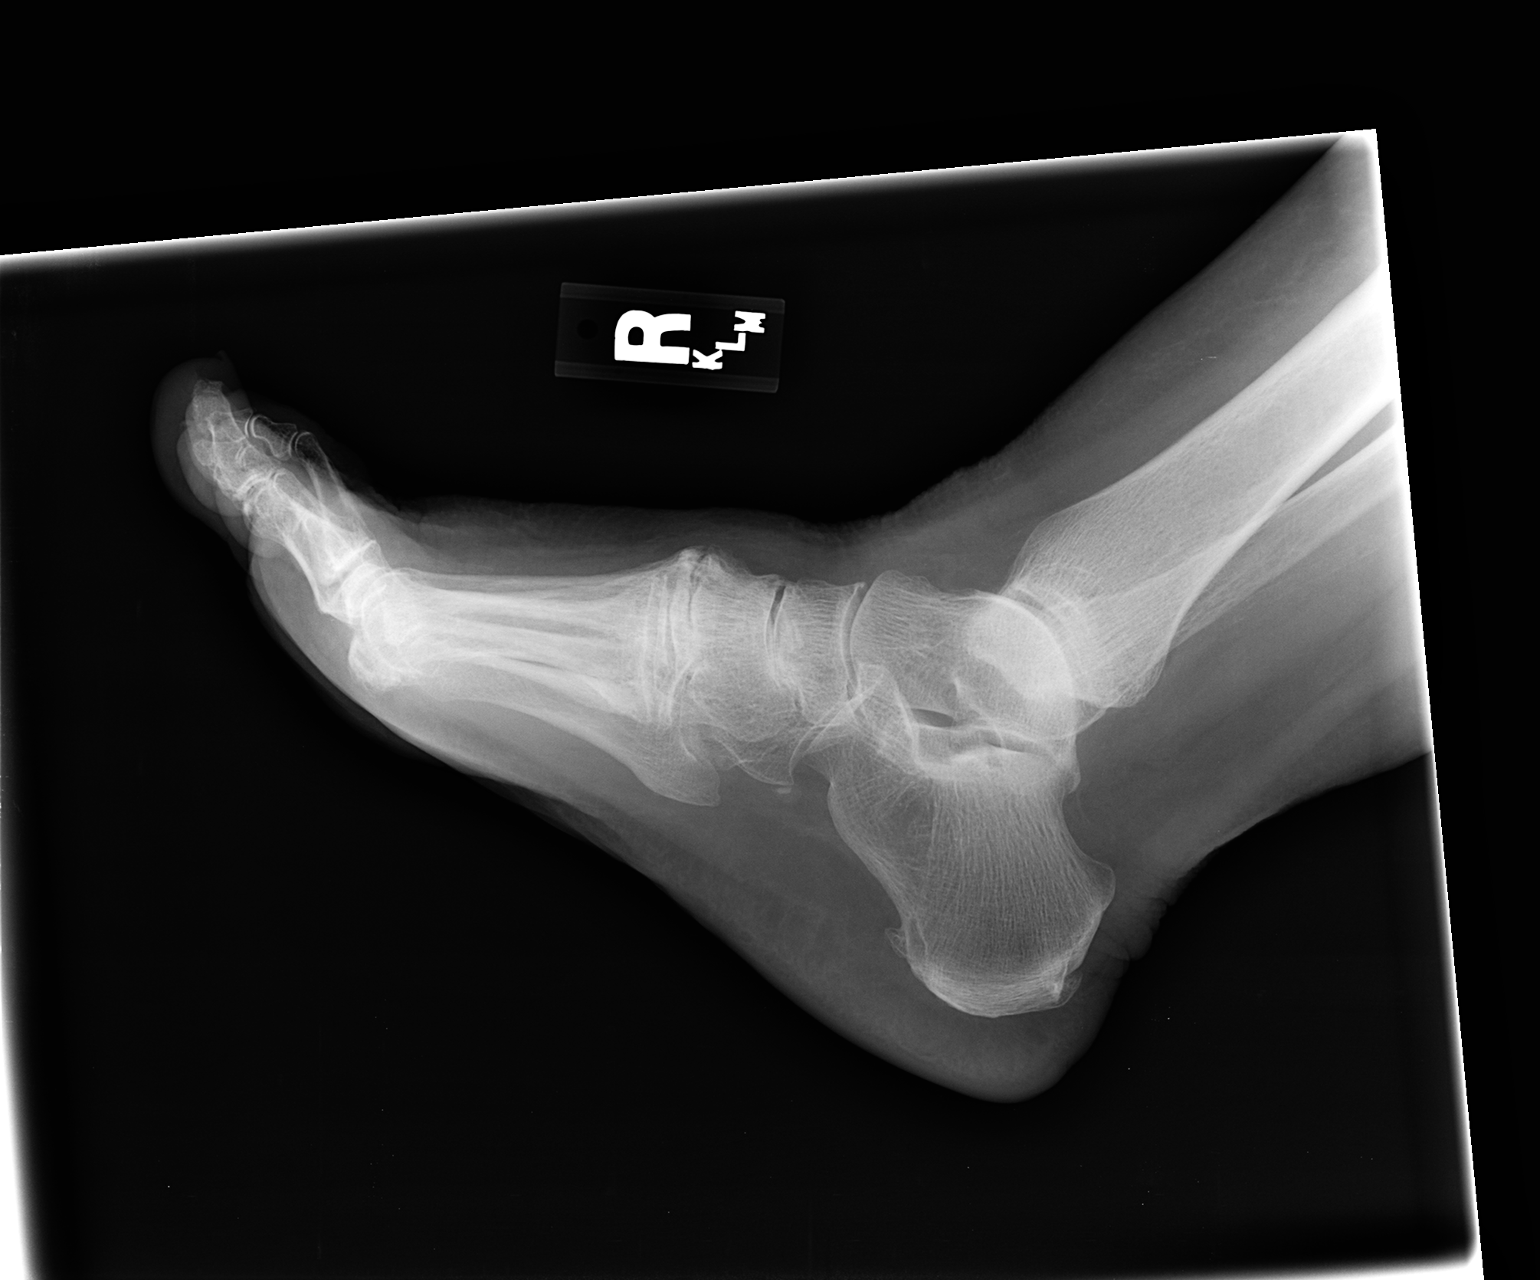

[3 of 3 positions shown; findings below may reference images not displayed]

FINDINGS: Vascular calcification. Diffuse degenerative change particularly
prominent at the tarsometatarsal joint spaces. No evidence of
erosive arthropathy. No evidence of fracture or dislocation. No
radiopaque foreign body.
IMPRESSION: Diffuse degenerative change.  No acute bony abnormality.

## 2016-01-31 DIAGNOSIS — M5441 Lumbago with sciatica, right side: Secondary | ICD-10-CM | POA: Diagnosis not present

## 2016-01-31 DIAGNOSIS — M160 Bilateral primary osteoarthritis of hip: Secondary | ICD-10-CM | POA: Diagnosis not present

## 2016-02-08 DIAGNOSIS — M545 Low back pain: Secondary | ICD-10-CM | POA: Diagnosis not present

## 2016-02-09 DIAGNOSIS — M545 Low back pain: Secondary | ICD-10-CM | POA: Diagnosis not present

## 2016-02-12 DIAGNOSIS — M722 Plantar fascial fibromatosis: Secondary | ICD-10-CM | POA: Diagnosis not present

## 2016-02-14 DIAGNOSIS — M545 Low back pain: Secondary | ICD-10-CM | POA: Diagnosis not present

## 2016-02-21 DIAGNOSIS — M545 Low back pain: Secondary | ICD-10-CM | POA: Diagnosis not present

## 2016-02-23 DIAGNOSIS — M545 Low back pain: Secondary | ICD-10-CM | POA: Diagnosis not present

## 2016-02-26 DIAGNOSIS — M545 Low back pain: Secondary | ICD-10-CM | POA: Diagnosis not present

## 2016-02-28 DIAGNOSIS — Z1231 Encounter for screening mammogram for malignant neoplasm of breast: Secondary | ICD-10-CM | POA: Diagnosis not present

## 2016-03-01 DIAGNOSIS — M545 Low back pain: Secondary | ICD-10-CM | POA: Diagnosis not present

## 2016-03-04 DIAGNOSIS — M545 Low back pain: Secondary | ICD-10-CM | POA: Diagnosis not present

## 2016-03-06 DIAGNOSIS — M1712 Unilateral primary osteoarthritis, left knee: Secondary | ICD-10-CM | POA: Diagnosis not present

## 2016-03-06 DIAGNOSIS — M545 Low back pain: Secondary | ICD-10-CM | POA: Diagnosis not present

## 2016-03-07 DIAGNOSIS — H10433 Chronic follicular conjunctivitis, bilateral: Secondary | ICD-10-CM | POA: Diagnosis not present

## 2016-03-07 DIAGNOSIS — H5213 Myopia, bilateral: Secondary | ICD-10-CM | POA: Diagnosis not present

## 2016-03-07 DIAGNOSIS — H52223 Regular astigmatism, bilateral: Secondary | ICD-10-CM | POA: Diagnosis not present

## 2016-03-07 DIAGNOSIS — H35363 Drusen (degenerative) of macula, bilateral: Secondary | ICD-10-CM | POA: Diagnosis not present

## 2016-03-07 DIAGNOSIS — Z961 Presence of intraocular lens: Secondary | ICD-10-CM | POA: Diagnosis not present

## 2016-03-13 DIAGNOSIS — M545 Low back pain: Secondary | ICD-10-CM | POA: Diagnosis not present

## 2016-04-01 DIAGNOSIS — M79671 Pain in right foot: Secondary | ICD-10-CM | POA: Diagnosis not present

## 2016-04-01 DIAGNOSIS — M1712 Unilateral primary osteoarthritis, left knee: Secondary | ICD-10-CM | POA: Diagnosis not present

## 2016-04-01 DIAGNOSIS — M5441 Lumbago with sciatica, right side: Secondary | ICD-10-CM | POA: Diagnosis not present

## 2016-04-03 ENCOUNTER — Other Ambulatory Visit: Payer: Self-pay | Admitting: Orthopaedic Surgery

## 2016-04-03 DIAGNOSIS — M545 Low back pain: Secondary | ICD-10-CM

## 2016-04-08 DIAGNOSIS — M1712 Unilateral primary osteoarthritis, left knee: Secondary | ICD-10-CM | POA: Diagnosis not present

## 2016-04-10 DIAGNOSIS — L4050 Arthropathic psoriasis, unspecified: Secondary | ICD-10-CM | POA: Diagnosis not present

## 2016-04-10 DIAGNOSIS — R7989 Other specified abnormal findings of blood chemistry: Secondary | ICD-10-CM | POA: Diagnosis not present

## 2016-04-10 DIAGNOSIS — Z79899 Other long term (current) drug therapy: Secondary | ICD-10-CM | POA: Diagnosis not present

## 2016-04-11 DIAGNOSIS — N390 Urinary tract infection, site not specified: Secondary | ICD-10-CM | POA: Diagnosis not present

## 2016-04-13 ENCOUNTER — Ambulatory Visit
Admission: RE | Admit: 2016-04-13 | Discharge: 2016-04-13 | Disposition: A | Discharge status: Home / Self Care | Payer: Medicare Other | Source: Ambulatory Visit | Attending: Orthopaedic Surgery | Admitting: Orthopaedic Surgery

## 2016-04-13 DIAGNOSIS — M545 Low back pain: Secondary | ICD-10-CM

## 2016-04-13 DIAGNOSIS — M5126 Other intervertebral disc displacement, lumbar region: Secondary | ICD-10-CM | POA: Diagnosis not present

## 2016-04-15 DIAGNOSIS — M1712 Unilateral primary osteoarthritis, left knee: Secondary | ICD-10-CM | POA: Diagnosis not present

## 2016-04-22 DIAGNOSIS — M1712 Unilateral primary osteoarthritis, left knee: Secondary | ICD-10-CM | POA: Diagnosis not present

## 2016-04-24 DIAGNOSIS — K219 Gastro-esophageal reflux disease without esophagitis: Secondary | ICD-10-CM | POA: Diagnosis not present

## 2016-04-24 DIAGNOSIS — D447 Neoplasm of uncertain behavior of aortic body and other paraganglia: Secondary | ICD-10-CM | POA: Diagnosis not present

## 2016-04-24 DIAGNOSIS — Z23 Encounter for immunization: Secondary | ICD-10-CM | POA: Diagnosis not present

## 2016-04-24 DIAGNOSIS — E010 Iodine-deficiency related diffuse (endemic) goiter: Secondary | ICD-10-CM | POA: Diagnosis not present

## 2016-04-24 DIAGNOSIS — Z8739 Personal history of other diseases of the musculoskeletal system and connective tissue: Secondary | ICD-10-CM | POA: Diagnosis not present

## 2016-04-24 DIAGNOSIS — E049 Nontoxic goiter, unspecified: Secondary | ICD-10-CM | POA: Diagnosis not present

## 2016-04-29 ENCOUNTER — Other Ambulatory Visit (HOSPITAL_COMMUNITY): Payer: Self-pay | Admitting: Orthopaedic Surgery

## 2016-04-29 DIAGNOSIS — M7989 Other specified soft tissue disorders: Principal | ICD-10-CM

## 2016-04-29 DIAGNOSIS — M79669 Pain in unspecified lower leg: Secondary | ICD-10-CM

## 2016-04-29 DIAGNOSIS — M1712 Unilateral primary osteoarthritis, left knee: Secondary | ICD-10-CM | POA: Diagnosis not present

## 2016-04-30 ENCOUNTER — Ambulatory Visit (HOSPITAL_COMMUNITY)
Admission: RE | Admit: 2016-04-30 | Discharge: 2016-04-30 | Disposition: A | Discharge status: Home / Self Care | Payer: Medicare Other | Source: Ambulatory Visit | Attending: Family Medicine | Admitting: Family Medicine

## 2016-04-30 DIAGNOSIS — M25562 Pain in left knee: Secondary | ICD-10-CM | POA: Diagnosis not present

## 2016-04-30 DIAGNOSIS — M79669 Pain in unspecified lower leg: Secondary | ICD-10-CM | POA: Diagnosis not present

## 2016-04-30 DIAGNOSIS — M7989 Other specified soft tissue disorders: Secondary | ICD-10-CM | POA: Diagnosis not present

## 2016-04-30 NOTE — Progress Notes (Signed)
VASCULAR LAB PRELIMINARY  PRELIMINARY  PRELIMINARY  PRELIMINARY  Left lower extremity venous duplex completed.    Preliminary report:  Left - No evidence of a DVT or superficial thrombosis. There is an area of fluid in the popliteal fossa consistent with a Baker's cyst.  Monifa Blanchette, RVS 04/30/2016, 5:43 PM

## 2016-05-09 DIAGNOSIS — J302 Other seasonal allergic rhinitis: Secondary | ICD-10-CM | POA: Diagnosis not present

## 2016-05-09 DIAGNOSIS — R2681 Unsteadiness on feet: Secondary | ICD-10-CM | POA: Diagnosis not present

## 2016-05-13 DIAGNOSIS — M1712 Unilateral primary osteoarthritis, left knee: Secondary | ICD-10-CM | POA: Diagnosis not present

## 2016-05-15 DIAGNOSIS — M25562 Pain in left knee: Secondary | ICD-10-CM | POA: Diagnosis not present

## 2016-05-15 DIAGNOSIS — M545 Low back pain: Secondary | ICD-10-CM | POA: Diagnosis not present

## 2016-05-15 DIAGNOSIS — M79642 Pain in left hand: Secondary | ICD-10-CM | POA: Diagnosis not present

## 2016-05-15 DIAGNOSIS — Z79899 Other long term (current) drug therapy: Secondary | ICD-10-CM | POA: Diagnosis not present

## 2016-05-15 DIAGNOSIS — L4050 Arthropathic psoriasis, unspecified: Secondary | ICD-10-CM | POA: Diagnosis not present

## 2016-05-15 DIAGNOSIS — M79641 Pain in right hand: Secondary | ICD-10-CM | POA: Diagnosis not present

## 2016-05-15 DIAGNOSIS — M79671 Pain in right foot: Secondary | ICD-10-CM | POA: Diagnosis not present

## 2016-05-27 DIAGNOSIS — M545 Low back pain: Secondary | ICD-10-CM | POA: Diagnosis not present

## 2016-05-27 DIAGNOSIS — M542 Cervicalgia: Secondary | ICD-10-CM | POA: Diagnosis not present

## 2016-05-30 DIAGNOSIS — M542 Cervicalgia: Secondary | ICD-10-CM | POA: Diagnosis not present

## 2016-06-04 DIAGNOSIS — M542 Cervicalgia: Secondary | ICD-10-CM | POA: Diagnosis not present

## 2016-06-06 DIAGNOSIS — M542 Cervicalgia: Secondary | ICD-10-CM | POA: Diagnosis not present

## 2016-06-10 DIAGNOSIS — M542 Cervicalgia: Secondary | ICD-10-CM | POA: Diagnosis not present

## 2016-06-12 DIAGNOSIS — M542 Cervicalgia: Secondary | ICD-10-CM | POA: Diagnosis not present

## 2016-06-18 DIAGNOSIS — M542 Cervicalgia: Secondary | ICD-10-CM | POA: Diagnosis not present

## 2016-06-21 DIAGNOSIS — M542 Cervicalgia: Secondary | ICD-10-CM | POA: Diagnosis not present

## 2016-06-24 DIAGNOSIS — M545 Low back pain: Secondary | ICD-10-CM | POA: Diagnosis not present

## 2016-06-25 DIAGNOSIS — M542 Cervicalgia: Secondary | ICD-10-CM | POA: Diagnosis not present

## 2016-06-28 DIAGNOSIS — M546 Pain in thoracic spine: Secondary | ICD-10-CM | POA: Diagnosis not present

## 2016-06-28 DIAGNOSIS — M545 Low back pain: Secondary | ICD-10-CM | POA: Diagnosis not present

## 2016-07-02 DIAGNOSIS — M546 Pain in thoracic spine: Secondary | ICD-10-CM | POA: Diagnosis not present

## 2016-07-02 DIAGNOSIS — M545 Low back pain: Secondary | ICD-10-CM | POA: Diagnosis not present

## 2016-07-04 DIAGNOSIS — J069 Acute upper respiratory infection, unspecified: Secondary | ICD-10-CM | POA: Diagnosis not present

## 2016-07-13 DIAGNOSIS — R05 Cough: Secondary | ICD-10-CM | POA: Diagnosis not present

## 2016-07-13 DIAGNOSIS — J0140 Acute pansinusitis, unspecified: Secondary | ICD-10-CM | POA: Diagnosis not present

## 2016-07-16 DIAGNOSIS — J329 Chronic sinusitis, unspecified: Secondary | ICD-10-CM | POA: Diagnosis not present

## 2016-07-16 DIAGNOSIS — L02224 Furuncle of groin: Secondary | ICD-10-CM | POA: Diagnosis not present

## 2016-07-25 DIAGNOSIS — M545 Low back pain: Secondary | ICD-10-CM | POA: Diagnosis not present

## 2016-07-25 DIAGNOSIS — M79641 Pain in right hand: Secondary | ICD-10-CM | POA: Diagnosis not present

## 2016-07-25 DIAGNOSIS — Z79899 Other long term (current) drug therapy: Secondary | ICD-10-CM | POA: Diagnosis not present

## 2016-07-25 DIAGNOSIS — L4050 Arthropathic psoriasis, unspecified: Secondary | ICD-10-CM | POA: Diagnosis not present

## 2016-07-25 DIAGNOSIS — M79642 Pain in left hand: Secondary | ICD-10-CM | POA: Diagnosis not present

## 2016-08-02 DIAGNOSIS — R05 Cough: Secondary | ICD-10-CM | POA: Diagnosis not present

## 2016-08-17 DIAGNOSIS — J069 Acute upper respiratory infection, unspecified: Secondary | ICD-10-CM | POA: Diagnosis not present

## 2016-08-17 DIAGNOSIS — R1032 Left lower quadrant pain: Secondary | ICD-10-CM | POA: Diagnosis not present

## 2016-08-23 DIAGNOSIS — R938 Abnormal findings on diagnostic imaging of other specified body structures: Secondary | ICD-10-CM | POA: Diagnosis not present

## 2016-08-23 DIAGNOSIS — J309 Allergic rhinitis, unspecified: Secondary | ICD-10-CM | POA: Diagnosis not present

## 2016-09-03 DIAGNOSIS — M545 Low back pain: Secondary | ICD-10-CM | POA: Diagnosis not present

## 2016-09-03 DIAGNOSIS — M25562 Pain in left knee: Secondary | ICD-10-CM | POA: Diagnosis not present

## 2016-09-03 DIAGNOSIS — R7989 Other specified abnormal findings of blood chemistry: Secondary | ICD-10-CM | POA: Diagnosis not present

## 2016-09-03 DIAGNOSIS — L4050 Arthropathic psoriasis, unspecified: Secondary | ICD-10-CM | POA: Diagnosis not present

## 2016-09-06 DIAGNOSIS — L409 Psoriasis, unspecified: Secondary | ICD-10-CM | POA: Diagnosis not present

## 2016-09-06 DIAGNOSIS — D447 Neoplasm of uncertain behavior of aortic body and other paraganglia: Secondary | ICD-10-CM | POA: Diagnosis not present

## 2016-09-06 DIAGNOSIS — H903 Sensorineural hearing loss, bilateral: Secondary | ICD-10-CM | POA: Diagnosis not present

## 2016-09-19 DIAGNOSIS — E119 Type 2 diabetes mellitus without complications: Secondary | ICD-10-CM | POA: Diagnosis not present

## 2016-09-19 DIAGNOSIS — J302 Other seasonal allergic rhinitis: Secondary | ICD-10-CM | POA: Diagnosis not present

## 2016-09-19 DIAGNOSIS — I10 Essential (primary) hypertension: Secondary | ICD-10-CM | POA: Diagnosis not present

## 2016-09-19 DIAGNOSIS — R8290 Unspecified abnormal findings in urine: Secondary | ICD-10-CM | POA: Diagnosis not present

## 2016-09-19 DIAGNOSIS — E049 Nontoxic goiter, unspecified: Secondary | ICD-10-CM | POA: Diagnosis not present

## 2016-09-19 DIAGNOSIS — R1032 Left lower quadrant pain: Secondary | ICD-10-CM | POA: Diagnosis not present

## 2016-09-19 DIAGNOSIS — E010 Iodine-deficiency related diffuse (endemic) goiter: Secondary | ICD-10-CM | POA: Diagnosis not present

## 2016-09-19 DIAGNOSIS — E559 Vitamin D deficiency, unspecified: Secondary | ICD-10-CM | POA: Diagnosis not present

## 2016-09-19 DIAGNOSIS — E782 Mixed hyperlipidemia: Secondary | ICD-10-CM | POA: Diagnosis not present

## 2016-09-20 ENCOUNTER — Other Ambulatory Visit: Payer: Self-pay | Admitting: Family Medicine

## 2016-09-20 DIAGNOSIS — R1032 Left lower quadrant pain: Secondary | ICD-10-CM

## 2016-09-20 DIAGNOSIS — E049 Nontoxic goiter, unspecified: Secondary | ICD-10-CM

## 2016-09-24 DIAGNOSIS — L02224 Furuncle of groin: Secondary | ICD-10-CM | POA: Diagnosis not present

## 2016-09-24 DIAGNOSIS — K5792 Diverticulitis of intestine, part unspecified, without perforation or abscess without bleeding: Secondary | ICD-10-CM | POA: Diagnosis not present

## 2016-09-27 ENCOUNTER — Other Ambulatory Visit: Payer: Medicare Other

## 2016-10-01 DIAGNOSIS — L02224 Furuncle of groin: Secondary | ICD-10-CM | POA: Diagnosis not present

## 2016-10-01 DIAGNOSIS — K5792 Diverticulitis of intestine, part unspecified, without perforation or abscess without bleeding: Secondary | ICD-10-CM | POA: Diagnosis not present

## 2016-10-02 ENCOUNTER — Other Ambulatory Visit: Payer: Medicare Other

## 2016-10-03 ENCOUNTER — Ambulatory Visit
Admission: RE | Admit: 2016-10-03 | Discharge: 2016-10-03 | Disposition: A | Discharge status: Home / Self Care | Payer: Medicare Other | Source: Ambulatory Visit | Attending: Family Medicine | Admitting: Family Medicine

## 2016-10-03 ENCOUNTER — Other Ambulatory Visit: Payer: Self-pay | Admitting: Family Medicine

## 2016-10-03 DIAGNOSIS — E049 Nontoxic goiter, unspecified: Secondary | ICD-10-CM

## 2016-10-03 DIAGNOSIS — R1032 Left lower quadrant pain: Secondary | ICD-10-CM | POA: Diagnosis not present

## 2016-10-03 MED ORDER — IOPAMIDOL (ISOVUE-300) INJECTION 61%
100.0000 mL | Freq: Once | INTRAVENOUS | Status: AC | PRN
  Administered 2016-10-03: 100 mL via INTRAVENOUS

## 2016-10-17 ENCOUNTER — Other Ambulatory Visit: Payer: Self-pay | Admitting: Family Medicine

## 2016-10-17 DIAGNOSIS — E042 Nontoxic multinodular goiter: Secondary | ICD-10-CM

## 2016-10-18 DIAGNOSIS — R7989 Other specified abnormal findings of blood chemistry: Secondary | ICD-10-CM | POA: Diagnosis not present

## 2016-10-18 DIAGNOSIS — M545 Low back pain: Secondary | ICD-10-CM | POA: Diagnosis not present

## 2016-10-18 DIAGNOSIS — L4050 Arthropathic psoriasis, unspecified: Secondary | ICD-10-CM | POA: Diagnosis not present

## 2016-10-22 ENCOUNTER — Ambulatory Visit
Admission: RE | Admit: 2016-10-22 | Discharge: 2016-10-22 | Disposition: A | Discharge status: Home / Self Care | Payer: Medicare Other | Source: Ambulatory Visit | Attending: Family Medicine | Admitting: Family Medicine

## 2016-10-22 ENCOUNTER — Other Ambulatory Visit (HOSPITAL_COMMUNITY)
Admission: RE | Admit: 2016-10-22 | Discharge: 2016-10-22 | Disposition: A | Discharge status: Home / Self Care | Payer: Medicare Other | Source: Ambulatory Visit | Attending: Physician Assistant | Admitting: Physician Assistant

## 2016-10-22 DIAGNOSIS — E042 Nontoxic multinodular goiter: Secondary | ICD-10-CM

## 2016-10-22 DIAGNOSIS — E041 Nontoxic single thyroid nodule: Secondary | ICD-10-CM | POA: Diagnosis not present

## 2016-10-22 NOTE — Procedures (Signed)
PROCEDURE SUMMARY:  Using direct ultrasound guidance, 3 passes were made using 25 g needles into the nodules within the left and right lobe of the thyroid.   Ultrasound was used to confirm needle placements on all occasions.   Specimens were sent to Pathology for analysis.  Alexy Bringle S Markey Deady PA-C 10/22/2016 4:23 PM

## 2016-10-23 DIAGNOSIS — M79641 Pain in right hand: Secondary | ICD-10-CM | POA: Diagnosis not present

## 2016-10-23 DIAGNOSIS — M25562 Pain in left knee: Secondary | ICD-10-CM | POA: Diagnosis not present

## 2016-10-23 DIAGNOSIS — M79642 Pain in left hand: Secondary | ICD-10-CM | POA: Diagnosis not present

## 2016-10-23 DIAGNOSIS — L4050 Arthropathic psoriasis, unspecified: Secondary | ICD-10-CM | POA: Diagnosis not present

## 2016-10-23 DIAGNOSIS — Z79899 Other long term (current) drug therapy: Secondary | ICD-10-CM | POA: Diagnosis not present

## 2016-10-24 DIAGNOSIS — L239 Allergic contact dermatitis, unspecified cause: Secondary | ICD-10-CM | POA: Diagnosis not present

## 2016-11-05 DIAGNOSIS — H01114 Allergic dermatitis of left upper eyelid: Secondary | ICD-10-CM | POA: Diagnosis not present

## 2016-11-05 DIAGNOSIS — H01111 Allergic dermatitis of right upper eyelid: Secondary | ICD-10-CM | POA: Diagnosis not present

## 2016-11-06 ENCOUNTER — Encounter: Payer: Self-pay | Admitting: Endocrinology

## 2016-11-06 ENCOUNTER — Ambulatory Visit (INDEPENDENT_AMBULATORY_CARE_PROVIDER_SITE_OTHER): Payer: Medicare Other | Admitting: Endocrinology

## 2016-11-06 DIAGNOSIS — E042 Nontoxic multinodular goiter: Secondary | ICD-10-CM

## 2016-11-06 NOTE — Progress Notes (Signed)
Subjective:    Patient ID: Lisa [name], female    DOB: 05/08/1936, 81 y.o.   MRN: [id number]  HPI Pt is referred by Dr Drema Dallas, for nodular thyroid.  Pt was noted to have a nodular goiter in 2013.  she has no h/o XRT or surgery to the neck (except gamma-knife to right ear in 2011).  She reports slight swelling of the anterior neck, and assoc pain.   Past Medical History:  Diagnosis Date  . Arthritis   . DDD (degenerative disc disease), cervical   . Deafness in right ear   . Diverticulosis   . Hearing loss in left ear   . Heart murmur   . Kidney stones   . Psoriasis     Past Surgical History:  Procedure Laterality Date  . APPENDECTOMY    . BILATERAL SALPINGOOPHORECTOMY  1996  . CATARACT EXTRACTION    . CHOLECYSTECTOMY  2002  . COLONOSCOPY  2003/2009/2015  . TOTAL ABDOMINAL HYSTERECTOMY  1979   FIBROIDS     Social History   Social History  . Marital status: Single    Spouse name: N/A  . Number of children: N/A  . Years of education: N/A   Occupational History  . Not on file.   Social History Main Topics  . Smoking status: Former Research scientist (life sciences)  . Smokeless tobacco: Not on file  . Alcohol use Yes     Comment: WINE  . Drug use: No  . Sexual activity: Not on file   Other Topics Concern  . Not on file   Social History Narrative  . No narrative on file    Current Outpatient Prescriptions on File Prior to Visit  Medication Sig Dispense Refill  . acetaminophen (TYLENOL) 325 MG tablet Take 650 mg by mouth every 6 (six) hours as needed for mild pain or moderate pain. 3/4 tabs daily if needed    . amLODipine (NORVASC) 5 MG tablet Take 5 mg by mouth daily.     [name] Kitchen aspirin 81 MG tablet Take 81 mg by mouth as needed for pain.    . Calcium Carbonate-Vit D-Min (CALTRATE PLUS PO) Take 1 tablet by mouth daily.     . Cholecalciferol (VITAMIN D) 2000 units tablet Take 2,000 Units by mouth daily.    . folic acid (FOLVITE) 1 MG tablet Take 1 mg by mouth daily.     [name] Kitchen  losartan-hydrochlorothiazide (HYZAAR) 100-25 MG tablet TAKE 1 TABLET BY MOUTH DAILY 90 tablet 3  . methotrexate (RHEUMATREX) 2.5 MG tablet Take 10 mg by mouth 2 (two) times a week.     [name] Kitchen ketoconazole (NIZORAL) 2 % cream Apply 1 application topically daily as needed for irritation.     No current facility-administered medications on file prior to visit.     Allergies  Allergen Reactions  . Lisinopril     Cough   . Penicillins     whelp    Family History  Problem Relation Age of Onset  . Heart disease Mother   . Goiter Mother   . Heart disease Father   . Heart disease Brother   . Goiter Brother   . Heart disease Paternal Grandfather   . Heart disease Brother     BP 138/68   Pulse 68   Ht 5' 1.5" (1.562 m)   Wt 197 lb (89.4 kg)   SpO2 93%   BMI 36.62 kg/m   Review of Systems Denies weight change, hoarseness, diplopia, chest pain, sob, cough, dysphagia, diarrhea,  itching, flushing, easy bruising, depression, cold intolerance, headache, numbness, and rhinorrhea.      Objective:   Physical Exam VS: see vs page GEN: no distress HEAD: head: no deformity eyes: no periorbital swelling, no proptosis external nose and ears are normal mouth: no lesion seen NECK: the largeg right and left nodules are easily palpable.   CHEST WALL: no deformity LUNGS: clear to auscultation CV: reg rate and rhythm; soft systolic murmur ABD: abdomen is soft, nontender.  no hepatosplenomegaly.  not distended.  no hernia MUSCULOSKELETAL: muscle bulk and strength are grossly normal.  no obvious joint swelling.  gait is steady with a cane EXTEMITIES: no deformity.  no edema PULSES: no carotid bruit NEURO:  cn 2-12 grossly intact.   readily moves all 4's.  sensation is intact to touch on all 4's SKIN:  Normal texture and temperature.  No rash or suspicious lesion is visible.   NODES:  None palpable at the neck PSYCH: alert, well-oriented.  Does not appear anxious nor depressed.   [place] (2013):  Diffusely heterogeneous echotexture. Left lobe: Measures 6.7 x 2.3 x 2.7 cm. Multiple mixed cystic and solid nodules of varying sizes, measuring up to 1.6 x 1.6 x 0.9 cm. Doppler flow is associated with some of these nodules. No dominant nodule. Scattered macrocalcifications. Right lobe: Measures 6.8 x 4.3 x 4 cm. Multiple mixed cystic and solid nodules of varying sizes, measuring up to 1.9 x 1.9 x 1.9 cm. Doppler flow is associated with some of these nodules. No dominant nodule. Scattered macrocalcifications. Soft tissues of neck: No enlarged nodes.  [place] (2018): multinodular goiter, with 5.2 cm moderately suspicious mid right nodule AND 2.9 cm mildly suspicious superior left nodule  cytology of 2 biggest nodules:  BENIGN FOLLICULAR NODULE (BETHESDA CATEGORY II).  I have reviewed outside records, and summarized: Pt was noted to have multinodular goiter, and referred here.  She was also seen by [facility]NT at [facility].  Cough was felt to be due to GERD  outside test results are reviewed: TSH=0.4    Assessment & Plan:  Borderline low TSH: this argues against malignancy.  Multinodular goiter, new to me: it has grown over time, but bx was cat 2.  Advanced age: this is a relative contraindication to thyroid surgery.     Patient Instructions  No treatment is needed for the thyroid now. your "lumpy thyroid" will eventually become overactive.  this is usually a slow process, happening over the span of many years. Please return in 1 year.

## 2016-11-06 NOTE — Patient Instructions (Signed)
No treatment is needed for the thyroid now. your "lumpy thyroid" will eventually become overactive.  this is usually a slow process, happening over the span of many years. Please return in 1 year.

## 2016-11-07 DIAGNOSIS — E042 Nontoxic multinodular goiter: Secondary | ICD-10-CM | POA: Insufficient documentation

## 2016-11-14 DIAGNOSIS — L409 Psoriasis, unspecified: Secondary | ICD-10-CM | POA: Diagnosis not present

## 2016-11-14 DIAGNOSIS — H903 Sensorineural hearing loss, bilateral: Secondary | ICD-10-CM | POA: Diagnosis not present

## 2016-11-14 DIAGNOSIS — H608X1 Other otitis externa, right ear: Secondary | ICD-10-CM | POA: Diagnosis not present

## 2016-11-14 DIAGNOSIS — D447 Neoplasm of uncertain behavior of aortic body and other paraganglia: Secondary | ICD-10-CM | POA: Diagnosis not present

## 2016-11-21 DIAGNOSIS — J019 Acute sinusitis, unspecified: Secondary | ICD-10-CM | POA: Diagnosis not present

## 2016-12-09 DIAGNOSIS — F418 Other specified anxiety disorders: Secondary | ICD-10-CM | POA: Diagnosis not present

## 2016-12-09 DIAGNOSIS — R5383 Other fatigue: Secondary | ICD-10-CM | POA: Diagnosis not present

## 2016-12-23 ENCOUNTER — Ambulatory Visit (INDEPENDENT_AMBULATORY_CARE_PROVIDER_SITE_OTHER): Payer: Medicare Other | Admitting: Cardiology

## 2016-12-23 VITALS — BP 140/68 | HR 94 | Ht 60.0 in | Wt 200.6 lb

## 2016-12-23 DIAGNOSIS — I358 Other nonrheumatic aortic valve disorders: Secondary | ICD-10-CM

## 2016-12-23 DIAGNOSIS — R079 Chest pain, unspecified: Secondary | ICD-10-CM | POA: Diagnosis not present

## 2016-12-23 DIAGNOSIS — I10 Essential (primary) hypertension: Secondary | ICD-10-CM | POA: Diagnosis not present

## 2016-12-23 MED ORDER — LOSARTAN POTASSIUM-HCTZ 100-25 MG PO TABS: 1.0000 | ORAL_TABLET | Freq: Every day | ORAL | 3 refills | Status: DC

## 2016-12-23 NOTE — Progress Notes (Signed)
Lisa [name]. [address], [address] [place], [place]  [address] Phone: [phone] Fax:  [phone]  Date:  12/23/2016   ID:  Lisa [name], DOB 28-Jun-1936, MRN [id number]  PCP:  Leighton Ruff, MD   History of Present Illness: Lisa [name] is a 81 y.o. female with hypertension, obesity, borderline diabetes, family history of CAD here for follow up visit. 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 This is currently the size of a thumbnail, discovered when she had an MRI to consider implant with Dr. Elwyn Reach. [place] was in 2012. Dr. Danne Baxter.   Very similar complaints when compared to prior visit, fatigue, headache. She wonders if it's potentially methotrexate. Headache left-sided at one point. [employer]. Water aerobics, has not been going as frequently as she used to. Trying to lose weight.    Brother had CABG 20 years ago.  Has aortic murmur which echocardiogram shows aortic sclerosis. Radiation to the carotids. Carotid Doppler was normal.   Saw endocrine, thinks that there is no need at this point for thyroid medications. One new symptom she states is that she has had some chest pressure at times radiating across her chest wall nonexertional, lasting a few minutes duration, relieved with rest   Wt Readings from Last 3 Encounters:  12/23/16 200 lb 9.6 oz (91 kg)  11/06/16 197 lb (89.4 kg)  01/05/16 202 lb 9.6 oz (91.9 kg)     Past Medical History:  Diagnosis Date  . Arthritis   . DDD (degenerative disc disease), cervical   . Deafness in right ear   . Diverticulosis   . Hearing loss in left ear   . Heart murmur   . Kidney stones   . Psoriasis     Past Surgical History:  Procedure Laterality Date  . APPENDECTOMY    . BILATERAL SALPINGOOPHORECTOMY  1996  . CATARACT EXTRACTION    . CHOLECYSTECTOMY  2002  . COLONOSCOPY  2003/2009/2015  . TOTAL ABDOMINAL HYSTERECTOMY  1979   FIBROIDS     Current  Outpatient Prescriptions  Medication Sig Dispense Refill  . acetaminophen (TYLENOL) 325 MG tablet Take 650 mg by mouth every 6 (six) hours as needed for mild pain or moderate pain. 3/4 tabs daily if needed    . amLODipine (NORVASC) 5 MG tablet Take 5 mg by mouth daily.     [name] Kitchen aspirin 81 MG tablet Take 81 mg by mouth as needed for pain.    . Calcium Carbonate-Vit D-Min (CALTRATE PLUS PO) Take 1 tablet by mouth daily.     . Cholecalciferol (VITAMIN D) 2000 units tablet Take 2,000 Units by mouth daily.    . Cyanocobalamin (B-12) 1000 MCG CAPS Take by mouth daily.    . folic acid (FOLVITE) 1 MG tablet Take 1 mg by mouth daily.     [name] Kitchen losartan-hydrochlorothiazide (HYZAAR) 100-25 MG tablet Take 1 tablet by mouth daily. 90 tablet 3  . methotrexate (RHEUMATREX) 2.5 MG tablet Take 10 mg by mouth 2 (two) times a week.     [name] Kitchen olopatadine (PATANOL) 0.1 % ophthalmic solution Place 1 drop into both eyes as needed for allergies.    Vladimir Faster Glycol-Propyl Glycol (SYSTANE OP) Apply to eye as needed.    . sodium chloride (OCEAN) 0.65 % SOLN nasal spray Place 1 spray into both nostrils as needed for congestion.     No current facility-administered medications  for this visit.     Allergies:    Allergies  Allergen Reactions  . Lisinopril     Cough   . Penicillins     whelp    Social History:  The patient  reports that she has quit smoking. She does not have any smokeless tobacco history on file. She reports that she drinks alcohol. She reports that she does not use drugs.   Family History  Problem Relation Age of Onset  . Heart disease Mother   . Goiter Mother   . Heart disease Father   . Heart disease Brother   . Goiter Brother   . Heart disease Paternal Grandfather   . Heart disease Brother     ROS:  Please see the history of present illness.     All other systems reviewed and negative.   PHYSICAL EXAM: VS:  BP 140/68   Pulse 94   Ht 5' (1.524 m)   Wt 200 lb 9.6 oz (91 kg)   LMP  (LMP  Unknown)   BMI 39.18 kg/m  GEN: Well nourished, well developed, in no acute distress  HEENT: normal  Neck: no JVD, carotid bruits, or masses Cardiac: RRR; 1/6 systolic murmur,no rubs, or gallops,no edema  Respiratory:  clear to auscultation bilaterally, normal work of breathing GI: soft, nontender, nondistended, + BS MS: no deformity or atrophy  Skin: warm and dry, no rash Neuro:  Alert and Oriented x 3, Strength and sensation are intact Psych: euthymic mood, full affect   EKG:  Today 12/23/16-sinus rhythm 94 with poor R wave progression otherwise no abnormalities personally viewed-prior 01/05/16 - NSR, PRWP no changes.   01/05/15-sinus rhythm, 65, borderline LVH, no other abnormalities. Personally viewed-prior 01/06/14-sinus rhythm, 89, old inferior infarct pattern. No change from prior.  Nuclear stress test 2009-low risk, no ischemia   ECHO: 2015 - Left ventricle: The cavity size was normal. Wall thickness was normal. Systolic function was normal. The estimated ejection fraction was in the range of 60% to 65%. Doppler parameters are consistent with abnormal left ventricular relaxation (grade 1 diastolic dysfunction). - Aortic valve: AV is thickened, calcified with mildly restricted motion. Peak and mean gradients through the valve are 19 and 10 mm Hg respectively. - Mitral valve: There was mild regurgitation. - Left atrium: The atrium was mildly dilated.  ECHO 2017: Compared to a prior echo in 2015, the aortic valve gradient is stable and more consistent with sclerosis, rather than aortic stenosis. LVEF is higher at 65-70%. Reassuring. No aortic stenosis.  LABS: LDL 81, other lab work reviewed from 09/19/16  Lower extremity Dopplers 04/30/16-No evidence of DVT  ASSESSMENT AND PLAN:  1. Aortic stenosis-more sclerosis than stenosis, reassuring. Murmur appreciated. Soft. Should be of no clinical consequence hemodynamically. 2. Hypertension-multidrug regimen.    Doubled her Hyzaar at a previous visit to 100/25. Continue with amlodipine. She is no longer on meloxicam. Dr. Drema Dallas is watching as well. 3. Family history coronary artery disease-Brother, father, nephew. Aggressive primary prevention, blood pressure control. 4. Obesity-continue to encourage weight loss. 5. Fatigue-difficult to pinpoint what exactly is causing the fatigue. Certainly regular exercise, weight loss, diet would be helpful. She is going to discuss with rheumatology about methotrexate as a potential reason. 6. Chest tightness-new symptom. We will check a pharmacologic stress test. 7. One year followup  Signed, Candee Furbish, MD [facility]  12/23/2016 11:03 AM

## 2016-12-23 NOTE — Patient Instructions (Signed)
Medication Instructions:  The current medical regimen is effective;  continue present plan and medications.  Testing/Procedures: Your physician has requested that you have a lexiscan myoview. For further information please visit [email or web address]. Please follow instruction sheet, as given.  Follow-Up: Follow up in 1 year with Dr. Skains.  You will receive a letter in the mail 2 months before you are due.  Please call us when you receive this letter to schedule your follow up appointment.  If you need a refill on your cardiac medications before your next appointment, please call your pharmacy.  Thank you for choosing [facility]!!     

## 2017-01-02 ENCOUNTER — Encounter (HOSPITAL_COMMUNITY): Payer: Medicare Other

## 2017-01-09 ENCOUNTER — Encounter (HOSPITAL_COMMUNITY): Payer: Medicare Other

## 2017-01-13 DIAGNOSIS — E559 Vitamin D deficiency, unspecified: Secondary | ICD-10-CM | POA: Diagnosis not present

## 2017-01-13 DIAGNOSIS — Z6839 Body mass index (BMI) 39.0-39.9, adult: Secondary | ICD-10-CM | POA: Diagnosis not present

## 2017-01-13 DIAGNOSIS — E669 Obesity, unspecified: Secondary | ICD-10-CM | POA: Diagnosis not present

## 2017-01-13 DIAGNOSIS — M150 Primary generalized (osteo)arthritis: Secondary | ICD-10-CM | POA: Diagnosis not present

## 2017-01-13 DIAGNOSIS — M5136 Other intervertebral disc degeneration, lumbar region: Secondary | ICD-10-CM | POA: Diagnosis not present

## 2017-01-13 DIAGNOSIS — L4059 Other psoriatic arthropathy: Secondary | ICD-10-CM | POA: Diagnosis not present

## 2017-01-14 ENCOUNTER — Telehealth: Payer: Self-pay | Admitting: Cardiology

## 2017-01-14 ENCOUNTER — Telehealth (HOSPITAL_COMMUNITY): Payer: Self-pay | Admitting: *Deleted

## 2017-01-14 NOTE — Telephone Encounter (Signed)
Pt calling back to speak with the person who left her a message about her stress test instructions.  Advised that was the nuc med department and she should call back to the number left tomorrow.  She states understanding and will c/b.

## 2017-01-14 NOTE — Telephone Encounter (Signed)
Left message on voicemail in reference to upcoming appointment scheduled for 01/17/17. Phone number given for a call back so details instructions can be given. Lisa [name], Lisa [name]

## 2017-01-14 NOTE — Telephone Encounter (Signed)
New Message ° ° pt verbalized that she is returning call for rn °

## 2017-01-15 ENCOUNTER — Telehealth (HOSPITAL_COMMUNITY): Payer: Self-pay | Admitting: *Deleted

## 2017-01-15 NOTE — Telephone Encounter (Signed)
Patient given detailed instructions per Myocardial Perfusion Study Information Sheet for the test on 01/17/17. Patient notified to arrive 15 minutes early and that it is imperative to arrive on time for appointment to keep from having the test rescheduled.  If you need to cancel or reschedule your appointment, please call the office within 24 hours of your appointment. . Patient verbalized understanding.  Lisa [name]

## 2017-01-16 DIAGNOSIS — L02419 Cutaneous abscess of limb, unspecified: Secondary | ICD-10-CM | POA: Diagnosis not present

## 2017-01-16 DIAGNOSIS — J302 Other seasonal allergic rhinitis: Secondary | ICD-10-CM | POA: Diagnosis not present

## 2017-01-17 ENCOUNTER — Encounter (HOSPITAL_COMMUNITY): Payer: Medicare Other

## 2017-01-23 ENCOUNTER — Encounter (HOSPITAL_COMMUNITY): Payer: Medicare Other

## 2017-02-25 DIAGNOSIS — J302 Other seasonal allergic rhinitis: Secondary | ICD-10-CM | POA: Diagnosis not present

## 2017-03-06 DIAGNOSIS — L02411 Cutaneous abscess of right axilla: Secondary | ICD-10-CM | POA: Diagnosis not present

## 2017-03-12 DIAGNOSIS — H608X1 Other otitis externa, right ear: Secondary | ICD-10-CM | POA: Diagnosis not present

## 2017-03-12 DIAGNOSIS — D447 Neoplasm of uncertain behavior of aortic body and other paraganglia: Secondary | ICD-10-CM | POA: Diagnosis not present

## 2017-03-12 DIAGNOSIS — H903 Sensorineural hearing loss, bilateral: Secondary | ICD-10-CM | POA: Diagnosis not present

## 2017-03-18 DIAGNOSIS — J069 Acute upper respiratory infection, unspecified: Secondary | ICD-10-CM | POA: Diagnosis not present

## 2017-03-18 DIAGNOSIS — E119 Type 2 diabetes mellitus without complications: Secondary | ICD-10-CM | POA: Diagnosis not present

## 2017-03-25 DIAGNOSIS — H43812 Vitreous degeneration, left eye: Secondary | ICD-10-CM | POA: Diagnosis not present

## 2017-03-25 DIAGNOSIS — Z961 Presence of intraocular lens: Secondary | ICD-10-CM | POA: Diagnosis not present

## 2017-03-25 DIAGNOSIS — H353132 Nonexudative age-related macular degeneration, bilateral, intermediate dry stage: Secondary | ICD-10-CM | POA: Diagnosis not present

## 2017-03-25 DIAGNOSIS — H43811 Vitreous degeneration, right eye: Secondary | ICD-10-CM | POA: Diagnosis not present

## 2017-04-02 DIAGNOSIS — Z87442 Personal history of urinary calculi: Secondary | ICD-10-CM | POA: Diagnosis not present

## 2017-04-02 DIAGNOSIS — E042 Nontoxic multinodular goiter: Secondary | ICD-10-CM | POA: Diagnosis not present

## 2017-04-02 DIAGNOSIS — E119 Type 2 diabetes mellitus without complications: Secondary | ICD-10-CM | POA: Diagnosis not present

## 2017-04-02 DIAGNOSIS — R5383 Other fatigue: Secondary | ICD-10-CM | POA: Diagnosis not present

## 2017-04-02 DIAGNOSIS — Z5181 Encounter for therapeutic drug level monitoring: Secondary | ICD-10-CM | POA: Diagnosis not present

## 2017-04-02 DIAGNOSIS — Z8349 Family history of other endocrine, nutritional and metabolic diseases: Secondary | ICD-10-CM | POA: Diagnosis not present

## 2017-04-02 DIAGNOSIS — Z923 Personal history of irradiation: Secondary | ICD-10-CM | POA: Diagnosis not present

## 2017-04-03 ENCOUNTER — Other Ambulatory Visit (HOSPITAL_COMMUNITY): Payer: Self-pay | Admitting: Internal Medicine

## 2017-04-03 DIAGNOSIS — E042 Nontoxic multinodular goiter: Secondary | ICD-10-CM

## 2017-04-09 DIAGNOSIS — R309 Painful micturition, unspecified: Secondary | ICD-10-CM | POA: Diagnosis not present

## 2017-04-15 ENCOUNTER — Encounter (HOSPITAL_COMMUNITY)
Admission: RE | Admit: 2017-04-15 | Discharge: 2017-04-15 | Disposition: A | Discharge status: Home / Self Care | Payer: Medicare Other | Source: Ambulatory Visit | Attending: Internal Medicine | Admitting: Internal Medicine

## 2017-04-15 DIAGNOSIS — E042 Nontoxic multinodular goiter: Secondary | ICD-10-CM | POA: Insufficient documentation

## 2017-04-15 MED ORDER — SODIUM IODIDE I 131 CAPSULE
7.0000 | Freq: Once | INTRAVENOUS | Status: AC | PRN
  Administered 2017-04-15: 7 via ORAL

## 2017-04-16 ENCOUNTER — Encounter (HOSPITAL_COMMUNITY)
Admission: RE | Admit: 2017-04-16 | Discharge: 2017-04-16 | Disposition: A | Discharge status: Home / Self Care | Payer: Medicare Other | Source: Ambulatory Visit | Attending: Internal Medicine | Admitting: Internal Medicine

## 2017-04-16 DIAGNOSIS — E042 Nontoxic multinodular goiter: Secondary | ICD-10-CM | POA: Diagnosis not present

## 2017-04-16 MED ORDER — SODIUM PERTECHNETATE TC 99M INJECTION
10.0000 | Freq: Once | INTRAVENOUS | Status: AC | PRN
  Administered 2017-04-16: 10 via INTRAVENOUS

## 2017-04-23 DIAGNOSIS — E0520 Thyrotoxicosis with toxic multinodular goiter without thyrotoxic crisis or storm: Secondary | ICD-10-CM | POA: Diagnosis not present

## 2017-04-23 DIAGNOSIS — Z923 Personal history of irradiation: Secondary | ICD-10-CM | POA: Diagnosis not present

## 2017-04-23 DIAGNOSIS — Z8349 Family history of other endocrine, nutritional and metabolic diseases: Secondary | ICD-10-CM | POA: Diagnosis not present

## 2017-04-23 DIAGNOSIS — E119 Type 2 diabetes mellitus without complications: Secondary | ICD-10-CM | POA: Diagnosis not present

## 2017-04-24 DIAGNOSIS — L4059 Other psoriatic arthropathy: Secondary | ICD-10-CM | POA: Diagnosis not present

## 2017-04-24 DIAGNOSIS — Z6838 Body mass index (BMI) 38.0-38.9, adult: Secondary | ICD-10-CM | POA: Diagnosis not present

## 2017-04-24 DIAGNOSIS — M5136 Other intervertebral disc degeneration, lumbar region: Secondary | ICD-10-CM | POA: Diagnosis not present

## 2017-04-24 DIAGNOSIS — E669 Obesity, unspecified: Secondary | ICD-10-CM | POA: Diagnosis not present

## 2017-04-24 DIAGNOSIS — E559 Vitamin D deficiency, unspecified: Secondary | ICD-10-CM | POA: Diagnosis not present

## 2017-04-24 DIAGNOSIS — M150 Primary generalized (osteo)arthritis: Secondary | ICD-10-CM | POA: Diagnosis not present

## 2017-05-05 DIAGNOSIS — L02411 Cutaneous abscess of right axilla: Secondary | ICD-10-CM | POA: Diagnosis not present

## 2017-05-22 DIAGNOSIS — L02419 Cutaneous abscess of limb, unspecified: Secondary | ICD-10-CM | POA: Diagnosis not present

## 2017-05-22 DIAGNOSIS — L270 Generalized skin eruption due to drugs and medicaments taken internally: Secondary | ICD-10-CM | POA: Diagnosis not present

## 2017-06-06 DIAGNOSIS — Z23 Encounter for immunization: Secondary | ICD-10-CM | POA: Diagnosis not present

## 2017-06-23 DIAGNOSIS — E010 Iodine-deficiency related diffuse (endemic) goiter: Secondary | ICD-10-CM | POA: Diagnosis not present

## 2017-06-27 DIAGNOSIS — M8589 Other specified disorders of bone density and structure, multiple sites: Secondary | ICD-10-CM | POA: Diagnosis not present

## 2017-06-27 DIAGNOSIS — M810 Age-related osteoporosis without current pathological fracture: Secondary | ICD-10-CM | POA: Diagnosis not present

## 2017-06-27 DIAGNOSIS — Z1231 Encounter for screening mammogram for malignant neoplasm of breast: Secondary | ICD-10-CM | POA: Diagnosis not present

## 2017-07-21 ENCOUNTER — Other Ambulatory Visit: Payer: Self-pay | Admitting: Otolaryngology

## 2017-07-21 DIAGNOSIS — D447 Neoplasm of uncertain behavior of aortic body and other paraganglia: Secondary | ICD-10-CM

## 2017-07-25 DIAGNOSIS — E559 Vitamin D deficiency, unspecified: Secondary | ICD-10-CM | POA: Diagnosis not present

## 2017-07-25 DIAGNOSIS — M150 Primary generalized (osteo)arthritis: Secondary | ICD-10-CM | POA: Diagnosis not present

## 2017-07-25 DIAGNOSIS — E669 Obesity, unspecified: Secondary | ICD-10-CM | POA: Diagnosis not present

## 2017-07-25 DIAGNOSIS — M810 Age-related osteoporosis without current pathological fracture: Secondary | ICD-10-CM | POA: Diagnosis not present

## 2017-07-25 DIAGNOSIS — M5136 Other intervertebral disc degeneration, lumbar region: Secondary | ICD-10-CM | POA: Diagnosis not present

## 2017-07-25 DIAGNOSIS — Z6839 Body mass index (BMI) 39.0-39.9, adult: Secondary | ICD-10-CM | POA: Diagnosis not present

## 2017-07-25 DIAGNOSIS — L4059 Other psoriatic arthropathy: Secondary | ICD-10-CM | POA: Diagnosis not present

## 2017-08-01 DIAGNOSIS — L0390 Cellulitis, unspecified: Secondary | ICD-10-CM | POA: Diagnosis not present

## 2017-08-01 DIAGNOSIS — M810 Age-related osteoporosis without current pathological fracture: Secondary | ICD-10-CM | POA: Diagnosis not present

## 2017-08-11 DIAGNOSIS — L0390 Cellulitis, unspecified: Secondary | ICD-10-CM | POA: Diagnosis not present

## 2017-08-16 DIAGNOSIS — M25572 Pain in left ankle and joints of left foot: Secondary | ICD-10-CM | POA: Diagnosis not present

## 2017-08-27 DIAGNOSIS — M810 Age-related osteoporosis without current pathological fracture: Secondary | ICD-10-CM | POA: Diagnosis not present

## 2017-08-27 DIAGNOSIS — Z923 Personal history of irradiation: Secondary | ICD-10-CM | POA: Diagnosis not present

## 2017-08-27 DIAGNOSIS — Z8349 Family history of other endocrine, nutritional and metabolic diseases: Secondary | ICD-10-CM | POA: Diagnosis not present

## 2017-08-27 DIAGNOSIS — K219 Gastro-esophageal reflux disease without esophagitis: Secondary | ICD-10-CM | POA: Diagnosis not present

## 2017-08-27 DIAGNOSIS — E0520 Thyrotoxicosis with toxic multinodular goiter without thyrotoxic crisis or storm: Secondary | ICD-10-CM | POA: Diagnosis not present

## 2017-09-03 ENCOUNTER — Other Ambulatory Visit: Payer: Self-pay

## 2017-09-03 ENCOUNTER — Encounter: Payer: Self-pay | Admitting: Physical Therapy

## 2017-09-03 ENCOUNTER — Ambulatory Visit: Payer: Medicare Other | Attending: Internal Medicine | Admitting: Physical Therapy

## 2017-09-03 DIAGNOSIS — G8929 Other chronic pain: Secondary | ICD-10-CM

## 2017-09-03 DIAGNOSIS — M6281 Muscle weakness (generalized): Secondary | ICD-10-CM

## 2017-09-03 DIAGNOSIS — R2681 Unsteadiness on feet: Secondary | ICD-10-CM | POA: Diagnosis not present

## 2017-09-03 DIAGNOSIS — M545 Low back pain: Secondary | ICD-10-CM | POA: Diagnosis not present

## 2017-09-03 NOTE — Therapy (Signed)
[place], [place], [address] Phone: [phone]   Fax:  [phone]  Physical Therapy Evaluation  Patient Details  Name: Lisa [name] MRN: [id number] Date of Birth: [birth year] Referring Provider: Delrae Rend, MD   Encounter Date: 09/03/2017  PT End of Session - 09/03/17 1243    Visit Number  1    Number of Visits  12    Date for PT Re-Evaluation  11/01/17    PT Start Time  1230    PT Stop Time  1320    PT Time Calculation (min)  50 min    Activity Tolerance  Patient tolerated treatment well    Behavior During Therapy  [facility] for tasks assessed/performed       Past Medical History:  Diagnosis Date  . Arthritis   . DDD (degenerative disc disease), cervical   . Deafness in right ear   . Diverticulosis   . Hearing loss in left ear   . Heart murmur   . Kidney stones   . Psoriasis     Past Surgical History:  Procedure Laterality Date  . APPENDECTOMY    . BILATERAL SALPINGOOPHORECTOMY  1996  . CATARACT EXTRACTION    . CHOLECYSTECTOMY  2002  . COLONOSCOPY  2003/2009/2015  . TOTAL ABDOMINAL HYSTERECTOMY  1979   FIBROIDS     There were no vitals filed for this visit.   Subjective Assessment - 09/03/17 1231    Subjective  Pt arriving to therapy reporting history of bulging disc, low bone density, and bilateral hip pain.     Pertinent History  L foot bone spur, essential HTN, DM, unsteady gait, arthritis, heart murmur, tumor which caused deathness in right ear, HOH in left ear, wears hearing aid    Limitations  House hold activities;Walking;Standing    Currently in Pain?  Yes    Pain Score  4     Pain Location  Back    Pain Orientation  Lower    Pain Descriptors / Indicators  Aching;Discomfort    Pain Radiating Towards  hips and buttock    Pain Onset  More than a month ago    Pain Frequency  Intermittent    Aggravating Factors   sitting long periods, sleeping on back, trouble walking    Pain  Relieving Factors  chaning positions    Multiple Pain Sites  Yes    Pain Score  4    Pain Location  Foot    Pain Orientation  Left;Right    Pain Descriptors / Indicators  Aching    Pain Type  Chronic pain    Pain Onset  More than a month ago    Pain Frequency  Constant    Aggravating Factors   walking, standing long periods    Pain Relieving Factors  resting, ice packs    Effect of Pain on Daily Activities  difficutly with walking and standing long periods, diffictuly with doning pants, pt has to sit down.          [facility] PT Assessment - 09/03/17 0001      Assessment   Medical Diagnosis  osteoporosis, arthritis, LBP, foot pain    Referring Provider  Delrae Rend, MD    Hand Dominance  Right    Prior Therapy  Yes in 2017 for hip pain      Precautions   Precautions  Fall      Restrictions   Weight Bearing Restrictions  No  Balance Screen   Has the patient fallen in the past 6 months  No    Has the patient had a decrease in activity level because of a fear of falling?   No    Is the patient reluctant to leave their home because of a fear of falling?   No      Home Environment   Living Environment  Private residence    Living Arrangements  Alone    Available Help at Discharge  Family    Type of [place] to enter    Entrance Stairs-Number of Steps  4    Entrance Stairs-Rails  Can reach both    [place]  Two level    Alternate Level Stairs-Number of Steps  12    Alternate Level Stairs-Rails  Right      Prior Function   Level of Independence  Independent    Vocation  Retired    Leisure centre manager work, Engineer, maintenance (IT)   Overall Cognitive Status  Within [employer] for tasks assessed      Observation/Other Assessments   Focus on Therapeutic Outcomes (FOTO)   66% limitation      Functional Tests   Functional tests  Sit to Stand      Sit to Stand   Comments  requires UE support for lower surfaces        Posture/Postural Control   Posture/Postural Control  Postural limitations    Postural Limitations  Rounded Shoulders;Forward head;Increased thoracic kyphosis      ROM / Strength   AROM / PROM / Strength  Strength      Strength   Strength Assessment Site  Hip;Knee    Right/Left Hip  Right;Left    Right Hip Flexion  4/5    Right Hip Extension  4/5    Right Hip ABduction  4/5    Right Hip ADduction  4/5    Left Hip Flexion  4/5    Left Hip Extension  4/5    Left Hip External Rotation  4/5    Left Hip ABduction  4/5    Left Hip ADduction  4/5    Right/Left Knee  Right;Left    Right Knee Flexion  4-/5    Right Knee Extension  4-/5    Left Knee Flexion  4-/5    Left Knee Extension  4-/5      Ambulation/Gait   Gait Pattern  Step-through pattern;Wide base of support;Poor foot clearance - left;Poor foot clearance - right      Standardized Balance Assessment   Standardized Balance Assessment  Berg Balance Test      Berg Balance Test   Sit to Stand  Able to stand without using hands and stabilize independently    Standing Unsupported  Able to stand safely 2 minutes    Sitting with Back Unsupported but Feet Supported on Floor or Stool  Able to sit safely and securely 2 minutes    Stand to Sit  Sits safely with minimal use of hands    Transfers  Able to transfer safely, definite need of hands    Standing Unsupported with Eyes Closed  Able to stand 10 seconds with supervision    Standing Ubsupported with Feet Together  Able to place feet together independently and stand for 1 minute with supervision    From Standing, Reach Forward with Outstretched Arm  Can reach forward >5  cm safely (2")    From Standing Position, Pick up Object from Floor  Unable to pick up and needs supervision    From Standing Position, Turn to Look Behind Over each Shoulder  Turn sideways only but maintains balance    Turn 360 Degrees  Able to turn 360 degrees safely but slowly    Standing Unsupported, Alternately  Place Feet on Step/Stool  Needs assistance to keep from falling or unable to try    Standing Unsupported, One Foot in [employer] balance while stepping or standing    Standing on One Leg  Unable to try or needs assist to prevent fall    Total Score  32    Berg comment:  Discussed recommendations for pt to always using her cane for support to help with balance             Objective measurements completed on examination: See above findings.              PT Education - 09/03/17 1340    Education provided  Yes    Education Details  Spinal decompression for HEP    Person(s) Educated  Patient    Methods  Explanation;Demonstration;Verbal cues;Handout    Comprehension  Returned demonstration;Verbal cues required;Verbalized understanding       PT Short Term Goals - 09/03/17 1349      PT SHORT TERM GOAL #1   Title  Pt will be independent in her HEP    Time  3    Period  Weeks    Status  New    Target Date  09/24/17        PT Long Term Goals - 09/03/17 1349      PT LONG TERM GOAL #1   Title  Pt will improve her FOTO from 66% limitiation to </= 51 % limitiation.     Time  6    Period  Weeks    Status  New    Target Date  10/15/17      PT LONG TERM GOAL #2   Title  Pt will improve her BERG balance score from 32/56 to >/= 40/56.     Baseline  32/56 on 09/03/17    Time  6    Period  Weeks    Target Date  10/15/17      PT LONG TERM GOAL #3   Title  Pt will improve her bilateral LE strength to grossly >/= +4/5 in order to improve functional mobility.     Time  6    Period  Weeks    Target Date  10/15/17      PT LONG TERM GOAL #4   Title  Pt will be able to self correct posture.     Time  6    Period  Weeks    Status  New    Target Date  10/15/17      PT LONG TERM GOAL #5   Title  Pt will report pain </= 3/10 with walking community distances.     Time  6    Period  Weeks    Status  New    Target Date  10/15/17             Plan - 09/03/17 1340     Clinical Impression Statement  Pt arriving as a low complextiy evalaution with dx of Osteoporosis, Low back pain. Pt also reporting back pain radiating into bilateral hips/buttock and bilateral foot pain. Pt  with weakness noted in bilateral LE's with abnormal gait pattern with intermittent step through/step to gait pattern with Wide BOS and decreased bilateral foot clearance. Pt reporting increased anxiety of falling. Pt presenting with mild difficulty with supine to sit from the mat table. BERG balance score was 32. Pt was instructed to use her straight cane for support to prevent falls and edu on importance of fall prevention.  Pt was issued a spinal decompression HEP and each exercises was performed 3-5 reps. Pt reporting feeling better once exercises were completed. Skilled PT needed to address pt's impairments with the below interventions.     History and Personal Factors relevant to plan of care:  Osteoporosis, HOH (wears hearing aid), essential HTN, Aortic stenosis, unsteady gait, arthritis, heart murmur    Clinical Presentation  Stable    Clinical Decision Making  Low    Rehab Potential  Good    PT Frequency  2x / week    PT Duration  6 weeks    PT Treatment/Interventions  ADLs/Self Care Home Management;Balance training;Therapeutic exercise;Therapeutic activities;Functional mobility training;Stair training;Gait training;Neuromuscular re-education;Patient/family education;Passive range of motion;Manual techniques    PT Next Visit Plan  Continue with Osteoporosis Program with review of spinal decompression and add shoulder stability exercises, progress balance, lumbar stretching/core strengthening    PT Home Exercise Plan  supine spinal decompression    Consulted and Agree with Plan of Care  Patient       Patient will benefit from skilled therapeutic intervention in order to improve the following deficits and impairments:  Abnormal gait  Visit Diagnosis: Gait instability  Muscle weakness  (generalized)  Chronic midline low back pain without sciatica     Problem List Patient Active Problem List   Diagnosis Date Noted  . Multinodular goiter 11/07/2016  . Aortic stenosis 01/06/2014  . Essential hypertension, benign 01/06/2014  . Family history of coronary artery disease 01/06/2014  . Diabetes ([place]) 01/06/2014  . 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 [place], [place], [address] Phone: [phone]   Fax:  [phone]  Name: Lisa [name] MRN: [id number] Date of Birth: Nov 21, 1935

## 2017-09-05 ENCOUNTER — Other Ambulatory Visit: Payer: Medicare Other

## 2017-09-08 ENCOUNTER — Ambulatory Visit: Payer: Medicare Other | Admitting: Physical Therapy

## 2017-09-09 DIAGNOSIS — L814 Other melanin hyperpigmentation: Secondary | ICD-10-CM | POA: Diagnosis not present

## 2017-09-09 DIAGNOSIS — L821 Other seborrheic keratosis: Secondary | ICD-10-CM | POA: Diagnosis not present

## 2017-09-09 DIAGNOSIS — L219 Seborrheic dermatitis, unspecified: Secondary | ICD-10-CM | POA: Diagnosis not present

## 2017-09-09 DIAGNOSIS — L739 Follicular disorder, unspecified: Secondary | ICD-10-CM | POA: Diagnosis not present

## 2017-09-09 DIAGNOSIS — D1801 Hemangioma of skin and subcutaneous tissue: Secondary | ICD-10-CM | POA: Diagnosis not present

## 2017-09-09 DIAGNOSIS — Z23 Encounter for immunization: Secondary | ICD-10-CM | POA: Diagnosis not present

## 2017-09-09 DIAGNOSIS — D225 Melanocytic nevi of trunk: Secondary | ICD-10-CM | POA: Diagnosis not present

## 2017-09-09 DIAGNOSIS — L853 Xerosis cutis: Secondary | ICD-10-CM | POA: Diagnosis not present

## 2017-09-12 ENCOUNTER — Encounter (HOSPITAL_COMMUNITY): Payer: Medicare Other

## 2017-09-16 ENCOUNTER — Ambulatory Visit: Payer: Medicare Other | Admitting: Physical Therapy

## 2017-09-16 ENCOUNTER — Encounter: Payer: Self-pay | Admitting: Physical Therapy

## 2017-09-16 DIAGNOSIS — M545 Low back pain: Secondary | ICD-10-CM | POA: Diagnosis not present

## 2017-09-16 DIAGNOSIS — R2681 Unsteadiness on feet: Secondary | ICD-10-CM

## 2017-09-16 DIAGNOSIS — G8929 Other chronic pain: Secondary | ICD-10-CM

## 2017-09-16 DIAGNOSIS — M6281 Muscle weakness (generalized): Secondary | ICD-10-CM

## 2017-09-16 NOTE — Patient Instructions (Addendum)
Over Head Pull: Narrow Grip       On back, knees bent, feet flat, band across thighs, elbows straight but relaxed. Pull hands apart (start). Keeping elbows straight, bring arms up and over head, hands toward floor. Keep pull steady on band. Hold momentarily. Return slowly, keeping pull steady, back to start. Repeat __5-  10    times. Band color ____Yellow__   Side Pull: Double Arm   On back, knees bent, feet flat. Arms perpendicular to body, shoulder level, elbows straight but relaxed. Pull arms out to sides, elbows straight. Resistance band comes across collarbones, hands toward floor. Hold momentarily. Slowly return to starting position. Repeat    5-10___ times. Band color _  Yellow____   Elmer Picker   On back, knees bent, feet flat, left hand on left hip, right hand above left. Pull right arm DIAGONALLY (hip to shoulder) across chest. Bring right arm along head toward floor. Hold momentarily. Slowly return to starting position. Repeat     5-10___ times. Do with left arm. Band color __Yellow____   LEFT with band,  Right no band due to pain.  Shoulder Rotation: Double Arm   On back, knees bent, feet flat, elbows tucked at sides, bent 90, hands palms up. Pull hands apart and down toward floor, keeping elbows near sides. Hold momentarily. Slowly return to starting position. Repeat    5-10___ times. Band color __Yellow____

## 2017-09-16 NOTE — Therapy (Signed)
[place], [place], [address] Phone: [phone]   Fax:  [phone]  Physical Therapy Treatment  Patient Details  Name: Lisa [name] MRN: [id number] Date of Birth: 1935-08-04 Referring Provider: Delrae Rend, MD   Encounter Date: 09/16/2017  PT End of Session - 09/16/17 1459    Visit Number  2    Number of Visits  12    Date for PT Re-Evaluation  11/01/17    PT Start Time  6073    PT Stop Time  1459    PT Time Calculation (min)  42 min    Activity Tolerance  Patient tolerated treatment well    Behavior During Therapy  [facility] for tasks assessed/performed       Past Medical History:  Diagnosis Date  . Arthritis   . DDD (degenerative disc disease), cervical   . Deafness in right ear   . Diverticulosis   . Hearing loss in left ear   . Heart murmur   . Kidney stones   . Psoriasis     Past Surgical History:  Procedure Laterality Date  . APPENDECTOMY    . BILATERAL SALPINGOOPHORECTOMY  1996  . CATARACT EXTRACTION    . CHOLECYSTECTOMY  2002  . COLONOSCOPY  2003/2009/2015  . TOTAL ABDOMINAL HYSTERECTOMY  1979   FIBROIDS     There were no vitals filed for this visit.  Subjective Assessment - 09/16/17 1420    Subjective  I don't feel my legs are as weak.  I have been doing the HEP with some back pain.    Currently in Pain?  Yes    Pain Score  4  up to 7/10    Pain Orientation  Lower    Pain Descriptors / Indicators  Aching discomfort  gets up to 6/10    Pain Frequency  Intermittent    Aggravating Factors   walking, sitting ,  sleeping on back    Pain Relieving Factors  change of position    Effect of Pain on Daily Activities  I walk bent over    Pain Score  -- no foot pain today.    Aggravating Factors   putting trash in dumpster. walking,, standing long periods                      [facility] Adult PT Treatment/Exercise - 09/16/17 0001      Transfers   Comments  supine to sit  cued for correct technique.  she gets dizzy with initial sitting.       Lumbar Exercises: Supine   Other Supine Lumbar Exercises  Decompression series all paractced cued for pillow under left leg to decrease pain.Decompression 5 minutes,  shoulder press,  head press,  leg lengthener,  leg press.      Shoulder Exercises: Supine   Other Supine Exercises  supine scapular stabilization series.   5-6 x each, yellow band  HEP  moderate cues.  narrow grip, sash, horizontal abd, external rotation.             PT Education - 09/16/17 1445    Education provided  Yes    Education Details  HEP New and previous.    Person(s) Educated  Patient    Methods  Explanation;Demonstration;Tactile cues;Verbal cues;Handout    Comprehension  Returned demonstration;Verbalized understanding       PT Short Term Goals - 09/03/17 1349      PT SHORT TERM GOAL #1  Title  Pt will be independent in her HEP    Time  3    Period  Weeks    Status  New    Target Date  09/24/17        PT Long Term Goals - 09/03/17 1349      PT LONG TERM GOAL #1   Title  Pt will improve her FOTO from 66% limitiation to </= 51 % limitiation.     Time  6    Period  Weeks    Status  New    Target Date  10/15/17      PT LONG TERM GOAL #2   Title  Pt will improve her BERG balance score from 32/56 to >/= 40/56.     Baseline  32/56 on 09/03/17    Time  6    Period  Weeks    Target Date  10/15/17      PT LONG TERM GOAL #3   Title  Pt will improve her bilateral LE strength to grossly >/= +4/5 in order to improve functional mobility.     Time  6    Period  Weeks    Target Date  10/15/17      PT LONG TERM GOAL #4   Title  Pt will be able to self correct posture.     Time  6    Period  Weeks    Status  New    Target Date  10/15/17      PT LONG TERM GOAL #5   Title  Pt will report pain </= 3/10 with walking community distances.     Time  6    Period  Weeks    Status  New    Target Date  10/15/17             Plan - 09/16/17 1500    Clinical Impression Statement  Patient is already seeing benifit from HEP with increased leg strength.  She was able to decrease pain with exercise.  Extra time needed for supine to sit and rest due to she gets dizzy. ( not new)  No pain increased at end of session, she declined the need for modalities.      PT Next Visit Plan  Continue with Osteoporosis Program with review of spinal decompression and review shoulder stability exercises, progress balance, lumbar stretching/core strengthening    PT Home Exercise Plan  supine spinal decompression,  supins scapular stabilization, yellow band    Consulted and Agree with Plan of Care  Patient       Patient will benefit from skilled therapeutic intervention in order to improve the following deficits and impairments:     Visit Diagnosis: Gait instability  Muscle weakness (generalized)  Chronic midline low back pain without sciatica     Problem List Patient Active Problem List   Diagnosis Date Noted  . Multinodular goiter 11/07/2016  . Aortic stenosis 01/06/2014  . Essential hypertension, benign 01/06/2014  . Family history of coronary artery disease 01/06/2014  . Diabetes ([place]) 01/06/2014  . 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

## 2017-09-18 ENCOUNTER — Encounter: Payer: Self-pay | Admitting: Physical Therapy

## 2017-09-18 ENCOUNTER — Ambulatory Visit: Payer: Medicare Other | Admitting: Physical Therapy

## 2017-09-18 DIAGNOSIS — M545 Low back pain, unspecified: Secondary | ICD-10-CM

## 2017-09-18 DIAGNOSIS — M6281 Muscle weakness (generalized): Secondary | ICD-10-CM

## 2017-09-18 DIAGNOSIS — G8929 Other chronic pain: Secondary | ICD-10-CM

## 2017-09-18 DIAGNOSIS — R2681 Unsteadiness on feet: Secondary | ICD-10-CM

## 2017-09-18 NOTE — Therapy (Signed)
[place], [place], [address] Phone: [phone]   Fax:  [phone]  Physical Therapy Treatment  Patient Details  Name: Lisa [name] MRN: [id number] Date of Birth: 09/10/1935 Referring Provider: Delrae Rend, MD   Encounter Date: 09/18/2017  PT End of Session - 09/18/17 1140    Visit Number  3    Number of Visits  12    Date for PT Re-Evaluation  11/01/17    PT Start Time  1140    PT Stop Time  1225    PT Time Calculation (min)  45 min    Activity Tolerance  Patient tolerated treatment well    Behavior During Therapy  [facility] for tasks assessed/performed       Past Medical History:  Diagnosis Date  . Arthritis   . DDD (degenerative disc disease), cervical   . Deafness in right ear   . Diverticulosis   . Hearing loss in left ear   . Heart murmur   . Kidney stones   . Psoriasis     Past Surgical History:  Procedure Laterality Date  . APPENDECTOMY    . BILATERAL SALPINGOOPHORECTOMY  1996  . CATARACT EXTRACTION    . CHOLECYSTECTOMY  2002  . COLONOSCOPY  2003/2009/2015  . TOTAL ABDOMINAL HYSTERECTOMY  1979   FIBROIDS     There were no vitals filed for this visit.  Subjective Assessment - 09/18/17 1140    Subjective  I was a little sore on wednesday but I am okay today.                       [facility] Adult PT Treatment/Exercise - 09/18/17 0001      Exercises   Exercises  Lumbar;Other Exercises    Other Exercises   toe yoga, heel/toe raises, tennis ball roll      Lumbar Exercises: Stretches   Lower Trunk Rotation  5 reps      Lumbar Exercises: Aerobic   Nustep  5 min L4 UE & LE      Lumbar Exercises: Supine   Pelvic Tilt  15 reps    Bent Knee Raise  20 reps    Other Supine Lumbar Exercises  yellow tband horiz abd, diagonals, ext rotation             PT Education - 09/18/17 1224    Education provided  Yes    Education Details  shoes, bed mobility, treking poles,  exercise form/rationale, HEP    Person(s) Educated  Patient    Methods  Explanation;Demonstration;Tactile cues;Verbal cues;Handout    Comprehension  Verbalized understanding;Need further instruction;Returned demonstration;Verbal cues required;Tactile cues required       PT Short Term Goals - 09/03/17 1349      PT SHORT TERM GOAL #1   Title  Pt will be independent in her HEP    Time  3    Period  Weeks    Status  New    Target Date  09/24/17        PT Long Term Goals - 09/03/17 1349      PT LONG TERM GOAL #1   Title  Pt will improve her FOTO from 66% limitiation to </= 51 % limitiation.     Time  6    Period  Weeks    Status  New    Target Date  10/15/17      PT LONG TERM GOAL #2  Title  Pt will improve her BERG balance score from 32/56 to >/= 40/56.     Baseline  32/56 on 09/03/17    Time  6    Period  Weeks    Target Date  10/15/17      PT LONG TERM GOAL #3   Title  Pt will improve her bilateral LE strength to grossly >/= +4/5 in order to improve functional mobility.     Time  6    Period  Weeks    Target Date  10/15/17      PT LONG TERM GOAL #4   Title  Pt will be able to self correct posture.     Time  6    Period  Weeks    Status  New    Target Date  10/15/17      PT LONG TERM GOAL #5   Title  Pt will report pain </= 3/10 with walking community distances.     Time  6    Period  Weeks    Status  New    Target Date  10/15/17            Plan - 09/18/17 1226    Clinical Impression Statement  Pt requested exercises for feet today which were practiced and she was happy with. Pt wants to increase walking but is fearful of falling, I suggested using treking poles rather than 2 canes to reduce trunk flexion. Began lumbopelvic strengthening today.     PT Treatment/Interventions  ADLs/Self Care Home Management;Balance training;Therapeutic exercise;Therapeutic activities;Functional mobility training;Stair training;Gait training;Neuromuscular  re-education;Patient/family education;Passive range of motion;Manual techniques    PT Next Visit Plan  progress core strength as appropriate, standing balance    PT Home Exercise Plan  supine spinal decompression,  supins scapular stabilization, yellow band; PPT, hooklying marching, toe yoga, heel/toe raises    Consulted and Agree with Plan of Care  Patient       Patient will benefit from skilled therapeutic intervention in order to improve the following deficits and impairments:  Abnormal gait  Visit Diagnosis: Gait instability  Muscle weakness (generalized)  Chronic midline low back pain without sciatica     Problem List Patient Active Problem List   Diagnosis Date Noted  . Multinodular goiter 11/07/2016  . Aortic stenosis 01/06/2014  . Essential hypertension, benign 01/06/2014  . Family history of coronary artery disease 01/06/2014  . Diabetes ([place]) 01/06/2014  . 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 Elsye Mccollister PT, DPT 09/18/17 12:29 PM   [facility] [address] [place], [place], [address] Phone: [phone]   Fax:  [phone]  Name: Lisa [name] MRN: [id number] Date of Birth: 1936/06/14

## 2017-09-22 ENCOUNTER — Ambulatory Visit: Payer: Medicare Other | Attending: Internal Medicine | Admitting: Physical Therapy

## 2017-09-22 ENCOUNTER — Encounter: Payer: Self-pay | Admitting: Physical Therapy

## 2017-09-22 DIAGNOSIS — R2681 Unsteadiness on feet: Secondary | ICD-10-CM | POA: Diagnosis not present

## 2017-09-22 DIAGNOSIS — G8929 Other chronic pain: Secondary | ICD-10-CM | POA: Diagnosis not present

## 2017-09-22 DIAGNOSIS — M545 Low back pain, unspecified: Secondary | ICD-10-CM

## 2017-09-22 DIAGNOSIS — M6281 Muscle weakness (generalized): Secondary | ICD-10-CM

## 2017-09-22 NOTE — Patient Instructions (Signed)

## 2017-09-22 NOTE — Therapy (Signed)
[place], [place], [address] Phone: [phone]   Fax:  [phone]  Physical Therapy Treatment  Patient Details  Name: Lisa [name] MRN: [id number] Date of Birth: 04-21-1936 Referring Provider: Delrae Rend, MD   Encounter Date: 09/22/2017    Past Medical History:  Diagnosis Date  . Arthritis   . DDD (degenerative disc disease), cervical   . Deafness in right ear   . Diverticulosis   . Hearing loss in left ear   . Heart murmur   . Kidney stones   . Psoriasis     Past Surgical History:  Procedure Laterality Date  . APPENDECTOMY    . BILATERAL SALPINGOOPHORECTOMY  1996  . CATARACT EXTRACTION    . CHOLECYSTECTOMY  2002  . COLONOSCOPY  2003/2009/2015  . TOTAL ABDOMINAL HYSTERECTOMY  1979   FIBROIDS     There were no vitals filed for this visit.  Subjective Assessment - 09/22/17 1341    Subjective  back  hurting  from housework.  I got some walking sticks  I have not tried them yet.   I has shoes orderd  and i bought a pair of supportive sandles with arches in them. i have been wearing them a little.  they may have made my  back a little sore    Currently in Pain?  Yes    Pain Score  4     Pain Location  Back    Pain Orientation  Lower    Pain Descriptors / Indicators  Aching    Pain Radiating Towards  hips and buttocks    Pain Frequency  Constant    Aggravating Factors   housework yesterday    Pain Relieving Factors  not sure it is there all the time    Pain Score  7    Pain Location  Foot    Pain Orientation  Right;Left    Pain Type  Chronic pain    Pain Frequency  Constant    Aggravating Factors   swelling from being on feet more    Pain Relieving Factors  elevated to get swelling down ,  shoes with arch supports. Nu Step    Effect of Pain on Daily Activities  difficulty things standing walking                      [facility] Adult PT Treatment/Exercise - 09/22/17 0001       Self-Care   Self-Care  ADL's    ADL's  demo house cleaning techiques for patient ,  handout issued      Lumbar Exercises: Aerobic   Nustep  5 min L4 UE & LE      Lumbar Exercises: Supine   Bent Knee Raise  5 reps cued to avoid back arch,  painful to do so stopped.    Bridge  10 reps    Other Supine Lumbar Exercises  yellow tband horiz abd, diagonals, ext rotation cued to stop prior to shoulder pain    Other Supine Lumbar Exercises  decompression 1 pillow under left leg      Moist Heat Therapy   Number Minutes Moist Heat  10 Minutes    Moist Heat Location  Lumbar Spine concurrent with some supine exercises              PT Education - 09/22/17 1339    Education provided  Yes    Education Details  ADL/  Cleaning  Person(s) Educated  Patient    Methods  Explanation;Demonstration;Verbal cues;Handout    Comprehension  Verbalized understanding       PT Short Term Goals - 09/22/17 1405      PT SHORT TERM GOAL #1   Title  Pt will be independent in her HEP    Baseline  minor cues    Time  3    Period  Weeks    Status  On-going        PT Long Term Goals - 09/03/17 1349      PT LONG TERM GOAL #1   Title  Pt will improve her FOTO from 66% limitiation to </= 51 % limitiation.     Time  6    Period  Weeks    Status  New    Target Date  10/15/17      PT LONG TERM GOAL #2   Title  Pt will improve her BERG balance score from 32/56 to >/= 40/56.     Baseline  32/56 on 09/03/17    Time  6    Period  Weeks    Target Date  10/15/17      PT LONG TERM GOAL #3   Title  Pt will improve her bilateral LE strength to grossly >/= +4/5 in order to improve functional mobility.     Time  6    Period  Weeks    Target Date  10/15/17      PT LONG TERM GOAL #4   Title  Pt will be able to self correct posture.     Time  6    Period  Weeks    Status  New    Target Date  10/15/17      PT LONG TERM GOAL #5   Title  Pt will report pain </= 3/10 with walking community distances.      Time  6    Period  Weeks    Status  New    Target Date  10/15/17            Plan - 09/22/17 1415    Clinical Impression Statement  Exercise focus  supine due to back pain.  She is compliant with HEP and with suggestions for walking sticks and supportive shoes.   She felt she was able to stand straighter with less pain at end of session.  Shoulder diagonals increased shoulder pain.  decreased with cues.     PT Next Visit Plan  progress core strength as appropriate, standing balance    PT Home Exercise Plan  supine spinal decompression,  supins scapular stabilization, yellow band; PPT, hooklying marching, toe yoga, heel/toe raises    Consulted and Agree with Plan of Care  Patient       Patient will benefit from skilled therapeutic intervention in order to improve the following deficits and impairments:     Visit Diagnosis: Gait instability  Muscle weakness (generalized)  Chronic midline low back pain without sciatica     Problem List Patient Active Problem List   Diagnosis Date Noted  . Multinodular goiter 11/07/2016  . Aortic stenosis 01/06/2014  . Essential hypertension, benign 01/06/2014  . Family history of coronary artery disease 01/06/2014  . Diabetes ([place]) 01/06/2014  . 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

## 2017-09-24 ENCOUNTER — Ambulatory Visit: Payer: Medicare Other | Admitting: Physical Therapy

## 2017-09-24 ENCOUNTER — Encounter: Payer: Self-pay | Admitting: Physical Therapy

## 2017-09-24 DIAGNOSIS — R2681 Unsteadiness on feet: Secondary | ICD-10-CM | POA: Diagnosis not present

## 2017-09-24 DIAGNOSIS — M6281 Muscle weakness (generalized): Secondary | ICD-10-CM

## 2017-09-24 DIAGNOSIS — G8929 Other chronic pain: Secondary | ICD-10-CM

## 2017-09-24 DIAGNOSIS — M545 Low back pain: Secondary | ICD-10-CM

## 2017-09-24 NOTE — Therapy (Addendum)
[place], [place], [address] Phone: [phone]   Fax:  [phone]  Physical Therapy Treatment/Discharge Summary Patient Details  Name: Lisa [name] MRN: [id number] Date of Birth: 04/18/1936 Referring Provider: Delrae Rend, MD   Encounter Date: 09/24/2017  PT End of Session - 09/24/17 1818    Visit Number  5    Number of Visits  12    Date for PT Re-Evaluation  11/01/17    PT Start Time  1330    PT Stop Time  1428    PT Time Calculation (min)  58 min    Activity Tolerance  Patient tolerated treatment well    Behavior During Therapy  [facility] for tasks assessed/performed       Past Medical History:  Diagnosis Date  . Arthritis   . DDD (degenerative disc disease), cervical   . Deafness in right ear   . Diverticulosis   . Hearing loss in left ear   . Heart murmur   . Kidney stones   . Psoriasis     Past Surgical History:  Procedure Laterality Date  . APPENDECTOMY    . BILATERAL SALPINGOOPHORECTOMY  1996  . CATARACT EXTRACTION    . CHOLECYSTECTOMY  2002  . COLONOSCOPY  2003/2009/2015  . TOTAL ABDOMINAL HYSTERECTOMY  1979   FIBROIDS     There were no vitals filed for this visit.  Subjective Assessment - 09/24/17 1340    Subjective  hANDS HURT AFTER USING THE BANDS THE OTHER DAY.  cOULD WE TYR SOMETHING ELSE?  Shoes have not arrived.  I feel stronger.    Currently in Pain?  Yes    Pain Score  4  was 5/10 yesterday    Pain Location  Back    Pain Orientation  Lower    Pain Descriptors / Indicators  Aching    Pain Radiating Towards  hips    Pain Frequency  Constant    Aggravating Factors   lifting something wrong,      Pain Relieving Factors  Moving around,  stretching    Pain Score  4    Pain Location  Foot    Pain Orientation  Left;Right    Pain Descriptors / Indicators  Aching    Pain Frequency  Constant    Aggravating Factors   swelling,  on feet more    Pain Relieving Factors   elevating                      [facility] Adult PT Treatment/Exercise - 09/24/17 0001      Lumbar Exercises: Standing   Other Standing Lumbar Exercises  mini squats 10 x,  monitored with cues as needed.      Lumbar Exercises: Supine   Large Ball Abdominal Isometric  10 reps sitting pressing thighs    Large Ball Oblique Isometric  10 reps sitting,  press into thighs      Knee/Hip Exercises: Standing   Forward Step Up  Both;1 set;5 reps;Hand Hold: 2;Step Height: 4"    Forward Step Up Limitations  left weaker,  CGA,  Cues      Shoulder Exercises: Supine   Other Supine Exercises  supine scapular stabilization series.   10 x each, yellow band  HEP  moderate cues.  narrow grip, sash, horizontal abd, external rotation.      Hand Exercises   Other Hand Exercises  wrist and hand AROM exercises practiced AROM so she will  be able to use bands for shoulder exercise.       Moist Heat Therapy   Number Minutes Moist Heat  15 Minutes    Moist Heat Location  Lumbar Spine             PT Education - 09/24/17 1818    Education provided  Yes    Education Details  HEP    Person(s) Educated  Patient    Methods  Explanation;Tactile cues;Verbal cues;Handout    Comprehension  Verbalized understanding;Returned demonstration       PT Short Term Goals - 09/22/17 1405      PT SHORT TERM GOAL #1   Title  Pt will be independent in her HEP    Baseline  minor cues    Time  3    Period  Weeks    Status  On-going        PT Long Term Goals - 09/03/17 1349      PT LONG TERM GOAL #1   Title  Pt will improve her FOTO from 66% limitiation to </= 51 % limitiation.     Time  6    Period  Weeks    Status  New    Target Date  10/15/17      PT LONG TERM GOAL #2   Title  Pt will improve her BERG balance score from 32/56 to >/= 40/56.     Baseline  32/56 on 09/03/17    Time  6    Period  Weeks    Target Date  10/15/17      PT LONG TERM GOAL #3   Title  Pt will improve her bilateral  LE strength to grossly >/= +4/5 in order to improve functional mobility.     Time  6    Period  Weeks    Target Date  10/15/17      PT LONG TERM GOAL #4   Title  Pt will be able to self correct posture.     Time  6    Period  Weeks    Status  New    Target Date  10/15/17      PT LONG TERM GOAL #5   Title  Pt will report pain </= 3/10 with walking community distances.     Time  6    Period  Weeks    Status  New    Target Date  10/15/17            Plan - 09/24/17 1819    Clinical Impression Statement  Able to progress HEP for scapular stabilization.  Special dowels attached to bands to avoid hurting her fingers with exercise.  Hand exercises given so she will be able to do harder shoulder exercises hopefully.   Mild pain increased with some overhead exercises which decreased with cues for  smALller ranges.  She tolerated standing exercise without increase in pain.      PT Next Visit Plan  progress core strength as appropriate, standing balance,  needs  LE strengthening.  She likes nustep.    PT Home Exercise Plan  supine spinal decompression,  supins scapular stabilization, yellow band; PPT, hooklying marching, toe yoga, heel/toe raises,  supine scapular series except no sash ex.,  yellow band,  hand / wrist ROM    Consulted and Agree with Plan of Care  Patient       Patient will benefit from skilled therapeutic intervention in order to improve the following deficits  and impairments:     Visit Diagnosis: Gait instability  Muscle weakness (generalized)  Chronic midline low back pain without sciatica     Problem List Patient Active Problem List   Diagnosis Date Noted  . Multinodular goiter 11/07/2016  . Aortic stenosis 01/06/2014  . Essential hypertension, benign 01/06/2014  . Family history of coronary artery disease 01/06/2014  . Diabetes ([place]) 01/06/2014  . 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 [place], [place], [address] Phone: [phone]   Fax:  [phone]  Name: Lisa [name] MRN: [id number] Date of Birth: 12/20/1935  PHYSICAL THERAPY DISCHARGE SUMMARY  Visits from Start of Care: 5  Current functional level related to goals / functional outcomes: See above   Remaining deficits: See above   Education / Equipment: Anatomy of condition, POC, HEP, exercise form/rationale  Plan: Patient agrees to discharge.  Patient goals were not met. Patient is being discharged due to not returning since the last visit.  ?????     Outside of POC.   Jessica C. Hightower PT, DPT 11/04/17 8:59 AM

## 2017-09-24 NOTE — Therapy (Signed)
[place], [place], [address] Phone: [phone]   Fax:  [phone]  Physical Therapy Treatment  Patient Details  Name: CHRISTNE PLATTS MRN: [id number] Date of Birth: [birth year] Referring Provider: Delrae Rend, MD   Encounter Date: 09/22/2017 Visit number 4 Number of visits  12 Date of Re evaluation   11/01/2017 PT start time  1330 PT stop time  1444 PT calculation time 44 minutes   Past Medical History:  Diagnosis Date  . Arthritis   . DDD (degenerative disc disease), cervical   . Deafness in right ear   . Diverticulosis   . Hearing loss in left ear   . Heart murmur   . Kidney stones   . Psoriasis     Past Surgical History:  Procedure Laterality Date  . APPENDECTOMY    . BILATERAL SALPINGOOPHORECTOMY  1996  . CATARACT EXTRACTION    . CHOLECYSTECTOMY  2002  . COLONOSCOPY  2003/2009/2015  . TOTAL ABDOMINAL HYSTERECTOMY  1979   FIBROIDS     There were no vitals filed for this visit.                             PT Short Term Goals - 09/22/17 1405      PT SHORT TERM GOAL #1   Title  Pt will be independent in her HEP    Baseline  minor cues    Time  3    Period  Weeks    Status  On-going        PT Long Term Goals - 09/03/17 1349      PT LONG TERM GOAL #1   Title  Pt will improve her FOTO from 66% limitiation to </= 51 % limitiation.     Time  6    Period  Weeks    Status  New    Target Date  10/15/17      PT LONG TERM GOAL #2   Title  Pt will improve her BERG balance score from 32/56 to >/= 40/56.     Baseline  32/56 on 09/03/17    Time  6    Period  Weeks    Target Date  10/15/17      PT LONG TERM GOAL #3   Title  Pt will improve her bilateral LE strength to grossly >/= +4/5 in order to improve functional mobility.     Time  6    Period  Weeks    Target Date  10/15/17      PT LONG TERM GOAL #4   Title  Pt will be able to self correct posture.      Time  6    Period  Weeks    Status  New    Target Date  10/15/17      PT LONG TERM GOAL #5   Title  Pt will report pain </= 3/10 with walking community distances.     Time  6    Period  Weeks    Status  New    Target Date  10/15/17              Patient will benefit from skilled therapeutic intervention in order to improve the following deficits and impairments:     Visit Diagnosis: Gait instability  Muscle weakness (generalized)  Chronic midline low back pain without sciatica     Problem List Patient Active Problem  List   Diagnosis Date Noted  . Multinodular goiter 11/07/2016  . Aortic stenosis 01/06/2014  . Essential hypertension, benign 01/06/2014  . Family history of coronary artery disease 01/06/2014  . Diabetes ([place]) 01/06/2014  . 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

## 2017-09-24 NOTE — Patient Instructions (Addendum)
Over Head Pull: Narrow Grip       On back, knees bent, feet flat, band across thighs, elbows straight but relaxed. Pull hands apart (start). Keeping elbows straight, bring arms up and over head, hands toward floor. Keep pull steady on band. Hold momentarily. Return slowly, keeping pull steady, back to start. Repeat _10__ times. Band color ___YELLOW___   Side Pull: Double Arm   On back, knees bent, feet flat. Arms perpendicular to body, shoulder level, elbows straight but relaxed. Pull arms out to sides, elbows straight. Resistance band comes across collarbones, hands toward floor. Hold momentarily. Slowly return to starting position. Repeat _10__ times. Band color __YELLOW___         Shoulder Rotation: Double Arm   On back, knees bent, feet flat, elbows tucked at sides, bent 90, hands palms up. Pull hands apart and down toward floor, keeping elbows near sides. Hold momentarily. Slowly return to starting position. Repeat _10__ times. Band color __Yellow____    Hand exercises issued from exercise drawer AROM wrist and fingers to be dome PRN 10 x each 1 second hold

## 2017-09-24 NOTE — Therapy (Signed)
[place], [place], [address] Phone: [phone]   Fax:  [phone]  Physical Therapy Treatment  Patient Details  Name: Lisa [name] MRN: [id number] Date of Birth: 10-02-1935 Referring Provider: Delrae Rend, MD   Encounter Date: 09/22/2017  Visit number # 4 Number of visits 12 Date of Re-evaluation  10/31/2017 PT start time  1330  PT stop time  1414  PT calculated time 44 minutes  Past Medical History:  Diagnosis Date  . Arthritis   . DDD (degenerative disc disease), cervical   . Deafness in right ear   . Diverticulosis   . Hearing loss in left ear   . Heart murmur   . Kidney stones   . Psoriasis     Past Surgical History:  Procedure Laterality Date  . APPENDECTOMY    . BILATERAL SALPINGOOPHORECTOMY  1996  . CATARACT EXTRACTION    . CHOLECYSTECTOMY  2002  . COLONOSCOPY  2003/2009/2015  . TOTAL ABDOMINAL HYSTERECTOMY  1979   FIBROIDS     There were no vitals filed for this visit.                             PT Short Term Goals - 09/22/17 1405      PT SHORT TERM GOAL #1   Title  Pt will be independent in her HEP    Baseline  minor cues    Time  3    Period  Weeks    Status  On-going        PT Long Term Goals - 09/03/17 1349      PT LONG TERM GOAL #1   Title  Pt will improve her FOTO from 66% limitiation to </= 51 % limitiation.     Time  6    Period  Weeks    Status  New    Target Date  10/15/17      PT LONG TERM GOAL #2   Title  Pt will improve her BERG balance score from 32/56 to >/= 40/56.     Baseline  32/56 on 09/03/17    Time  6    Period  Weeks    Target Date  10/15/17      PT LONG TERM GOAL #3   Title  Pt will improve her bilateral LE strength to grossly >/= +4/5 in order to improve functional mobility.     Time  6    Period  Weeks    Target Date  10/15/17      PT LONG TERM GOAL #4   Title  Pt will be able to self correct posture.      Time  6    Period  Weeks    Status  New    Target Date  10/15/17      PT LONG TERM GOAL #5   Title  Pt will report pain </= 3/10 with walking community distances.     Time  6    Period  Weeks    Status  New    Target Date  10/15/17              Patient will benefit from skilled therapeutic intervention in order to improve the following deficits and impairments:     Visit Diagnosis: Gait instability  Muscle weakness (generalized)  Chronic midline low back pain without sciatica     Problem List Patient Active Problem  List   Diagnosis Date Noted  . Multinodular goiter 11/07/2016  . Aortic stenosis 01/06/2014  . Essential hypertension, benign 01/06/2014  . Family history of coronary artery disease 01/06/2014  . Diabetes ([place]) 01/06/2014  . 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

## 2017-09-26 ENCOUNTER — Ambulatory Visit
Admission: RE | Admit: 2017-09-26 | Discharge: 2017-09-26 | Disposition: A | Discharge status: Home / Self Care | Payer: Medicare Other | Source: Ambulatory Visit | Attending: Otolaryngology | Admitting: Otolaryngology

## 2017-09-26 DIAGNOSIS — D447 Neoplasm of uncertain behavior of aortic body and other paraganglia: Secondary | ICD-10-CM | POA: Diagnosis not present

## 2017-09-26 MED ORDER — GADOBENATE DIMEGLUMINE 529 MG/ML IV SOLN
20.0000 mL | Freq: Once | INTRAVENOUS | Status: AC | PRN
  Administered 2017-09-26: 20 mL via INTRAVENOUS

## 2017-09-29 DIAGNOSIS — R2689 Other abnormalities of gait and mobility: Secondary | ICD-10-CM | POA: Diagnosis not present

## 2017-09-29 DIAGNOSIS — D447 Neoplasm of uncertain behavior of aortic body and other paraganglia: Secondary | ICD-10-CM | POA: Diagnosis not present

## 2017-09-29 DIAGNOSIS — H9313 Tinnitus, bilateral: Secondary | ICD-10-CM | POA: Diagnosis not present

## 2017-10-01 ENCOUNTER — Ambulatory Visit: Payer: Medicare Other | Admitting: Physical Therapy

## 2017-10-03 ENCOUNTER — Ambulatory Visit: Payer: Medicare Other | Admitting: Physical Therapy

## 2017-10-10 ENCOUNTER — Ambulatory Visit: Payer: Medicare Other | Admitting: Physical Therapy

## 2017-10-16 ENCOUNTER — Encounter: Payer: Medicare Other | Admitting: Physical Therapy

## 2017-10-23 DIAGNOSIS — M150 Primary generalized (osteo)arthritis: Secondary | ICD-10-CM | POA: Diagnosis not present

## 2017-10-23 DIAGNOSIS — Z6839 Body mass index (BMI) 39.0-39.9, adult: Secondary | ICD-10-CM | POA: Diagnosis not present

## 2017-10-23 DIAGNOSIS — L4059 Other psoriatic arthropathy: Secondary | ICD-10-CM | POA: Diagnosis not present

## 2017-10-23 DIAGNOSIS — M810 Age-related osteoporosis without current pathological fracture: Secondary | ICD-10-CM | POA: Diagnosis not present

## 2017-10-23 DIAGNOSIS — E669 Obesity, unspecified: Secondary | ICD-10-CM | POA: Diagnosis not present

## 2017-10-23 DIAGNOSIS — E559 Vitamin D deficiency, unspecified: Secondary | ICD-10-CM | POA: Diagnosis not present

## 2017-10-23 DIAGNOSIS — M5136 Other intervertebral disc degeneration, lumbar region: Secondary | ICD-10-CM | POA: Diagnosis not present

## 2017-11-04 DIAGNOSIS — L02211 Cutaneous abscess of abdominal wall: Secondary | ICD-10-CM | POA: Diagnosis not present

## 2017-11-04 DIAGNOSIS — R300 Dysuria: Secondary | ICD-10-CM | POA: Diagnosis not present

## 2017-11-06 ENCOUNTER — Ambulatory Visit: Payer: Medicare Other | Admitting: Endocrinology

## 2017-11-06 DIAGNOSIS — L0291 Cutaneous abscess, unspecified: Secondary | ICD-10-CM | POA: Diagnosis not present

## 2017-11-10 DIAGNOSIS — L02211 Cutaneous abscess of abdominal wall: Secondary | ICD-10-CM | POA: Diagnosis not present

## 2017-11-14 DIAGNOSIS — L02211 Cutaneous abscess of abdominal wall: Secondary | ICD-10-CM | POA: Diagnosis not present

## 2017-11-19 DIAGNOSIS — H608X1 Other otitis externa, right ear: Secondary | ICD-10-CM | POA: Diagnosis not present

## 2017-11-19 DIAGNOSIS — D447 Neoplasm of uncertain behavior of aortic body and other paraganglia: Secondary | ICD-10-CM | POA: Diagnosis not present

## 2017-11-19 DIAGNOSIS — H903 Sensorineural hearing loss, bilateral: Secondary | ICD-10-CM | POA: Diagnosis not present

## 2017-11-25 DIAGNOSIS — N952 Postmenopausal atrophic vaginitis: Secondary | ICD-10-CM | POA: Diagnosis not present

## 2017-11-25 DIAGNOSIS — N760 Acute vaginitis: Secondary | ICD-10-CM | POA: Diagnosis not present

## 2017-11-25 DIAGNOSIS — Z01411 Encounter for gynecological examination (general) (routine) with abnormal findings: Secondary | ICD-10-CM | POA: Diagnosis not present

## 2017-12-11 ENCOUNTER — Other Ambulatory Visit: Payer: Self-pay | Admitting: Cardiology

## 2017-12-17 DIAGNOSIS — M1712 Unilateral primary osteoarthritis, left knee: Secondary | ICD-10-CM | POA: Diagnosis not present

## 2017-12-23 ENCOUNTER — Ambulatory Visit: Payer: Medicare Other | Admitting: Cardiology

## 2017-12-25 ENCOUNTER — Ambulatory Visit (INDEPENDENT_AMBULATORY_CARE_PROVIDER_SITE_OTHER): Payer: Medicare Other | Admitting: Cardiology

## 2017-12-25 ENCOUNTER — Encounter: Payer: Self-pay | Admitting: Cardiology

## 2017-12-25 VITALS — BP 146/68 | HR 85 | Ht 60.0 in | Wt 201.8 lb

## 2017-12-25 DIAGNOSIS — I358 Other nonrheumatic aortic valve disorders: Secondary | ICD-10-CM

## 2017-12-25 DIAGNOSIS — R011 Cardiac murmur, unspecified: Secondary | ICD-10-CM | POA: Diagnosis not present

## 2017-12-25 DIAGNOSIS — R079 Chest pain, unspecified: Secondary | ICD-10-CM

## 2017-12-25 DIAGNOSIS — I10 Essential (primary) hypertension: Secondary | ICD-10-CM | POA: Diagnosis not present

## 2017-12-25 MED ORDER — LOSARTAN POTASSIUM-HCTZ 100-25 MG PO TABS: 1.0000 | ORAL_TABLET | Freq: Every day | ORAL | 3 refills | Status: DC

## 2017-12-25 NOTE — Progress Notes (Signed)
[place] [address] [place], [place]  [address] Phone: [phone] Fax:  [phone]  Date:  12/25/2017   ID:  Lisa [name], DOB 1936/06/11, MRN [id number]  PCP:  Leighton Ruff, MD   History of Present Illness: Lisa [name] is a 82 y.o. female with hypertension, obesity, borderline diabetes, family history of CAD here for follow up visit. 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 This is currently the size of a thumbnail, discovered when she had an MRI to consider implant with Dr. Elwyn Reach. [place] was in 2012. Dr. Danne Baxter.   Very similar complaints when compared to prior visit, fatigue, headache. She wonders if it's potentially methotrexate. Headache left-sided at one point. [employer]. Water aerobics, has not been going as frequently as she used to. Trying to lose weight.    Brother had CABG 20 years ago.  Has aortic murmur which echocardiogram shows aortic sclerosis. Radiation to the carotids. Carotid Doppler was normal.   Saw endocrine, thinks that there is no need at this point for thyroid medications. One new symptom she states is that she has had some chest pressure at times radiating across her chest wall nonexertional, lasting a few minutes duration, relieved with rest.  12/25/2017- note reviewed as above.  She is not having any further chest discomfort.  She still is having fatigue.  Does not need thyroid medication at this point.  Trying to exercise as best as possible.  Using cane for stabilization given her back and knee pain.  No shortness of breath, no syncope, no bleeding.   Wt Readings from Last 3 Encounters:  12/25/17 201 lb 12.8 oz (91.5 kg)  12/23/16 200 lb 9.6 oz (91 kg)  11/06/16 197 lb (89.4 kg)     Past Medical History:  Diagnosis Date  . Arthritis   . DDD (degenerative disc disease), cervical   . Deafness in right ear   . Diverticulosis   . Hearing loss in left ear   .  Heart murmur   . Kidney stones   . Psoriasis     Past Surgical History:  Procedure Laterality Date  . APPENDECTOMY    . BILATERAL SALPINGOOPHORECTOMY  1996  . CATARACT EXTRACTION    . CHOLECYSTECTOMY  2002  . COLONOSCOPY  2003/2009/2015  . TOTAL ABDOMINAL HYSTERECTOMY  1979   FIBROIDS     Current Outpatient Medications  Medication Sig Dispense Refill  . acetaminophen (TYLENOL) 325 MG tablet Take 650 mg by mouth every 6 (six) hours as needed for mild pain or moderate pain. 3/4 tabs daily if needed    . amLODipine (NORVASC) 5 MG tablet Take 5 mg by mouth daily.     [name] Kitchen aspirin 81 MG tablet Take 81 mg by mouth as needed for pain.    . Calcium Carbonate-Vit D-Min (CALTRATE PLUS PO) Take 1 tablet by mouth 2 (two) times daily.     . Cholecalciferol (VITAMIN D) 2000 units tablet Take 2,000 Units by mouth daily.    . Cyanocobalamin (B-12) 1000 MCG CAPS Take by mouth daily.    . folic acid (FOLVITE) 1 MG tablet Take 1 mg by mouth daily.     [name] Kitchen losartan-hydrochlorothiazide (HYZAAR) 100-25 MG tablet Take 1 tablet by mouth daily. 90 tablet 3  . methotrexate (RHEUMATREX) 2.5 MG tablet Take 10 mg by mouth 2 (two) times a week.     [name] Kitchen  olopatadine (PATANOL) 0.1 % ophthalmic solution Place 1 drop into both eyes as needed for allergies.    Vladimir Faster Glycol-Propyl Glycol (SYSTANE OP) Apply to eye as needed.    . sodium chloride (OCEAN) 0.65 % SOLN nasal spray Place 1 spray into both nostrils as needed for congestion.     No current facility-administered medications for this visit.     Allergies:    Allergies  Allergen Reactions  . Lisinopril     Cough   . Penicillins     whelp    Social History:  The patient  reports that she has quit smoking. She has never used smokeless tobacco. She reports that she drinks alcohol. She reports that she does not use drugs.   Family History  Problem Relation Age of Onset  . Heart disease Mother   . Goiter Mother   . Heart disease Father   . Heart  disease Brother   . Goiter Brother   . Heart disease Paternal Grandfather   . Heart disease Brother     ROS:  Please see the history of present illness.     All other ROS neg  PHYSICAL EXAM: VS:  BP (!) 146/68   Pulse 85   Ht 5' (1.524 m)   Wt 201 lb 12.8 oz (91.5 kg)   LMP  (LMP Unknown)   BMI 39.41 kg/m  GEN: Well nourished, well developed, in no acute distress  HEENT: normal  Neck: no JVD, carotid bruits, or masses Cardiac: RRR; 2/6 SM, no rubs, or gallops,no edema  Respiratory:  clear to auscultation bilaterally, normal work of breathing GI: soft, nontender, nondistended, + BS MS: no deformity or atrophy  Skin: warm and dry, no rash Neuro:  Alert and Oriented x 3, Strength and sensation are intact Psych: euthymic mood, full affect    EKG:  Today 12/25/2017-sinus rhythm 85 inferior infarct pattern, poor R wave progression personally viewed 12/23/16-sinus rhythm 94 with poor R wave progression otherwise no abnormalities personally viewed-prior 01/05/16 - NSR, PRWP no changes.   01/05/15-sinus rhythm, 65, borderline LVH, no other abnormalities. Personally viewed-prior 01/06/14-sinus rhythm, 89, old inferior infarct pattern. No change from prior.  Nuclear stress test 2009-low risk, no ischemia   ECHO: 2015 - Left ventricle: The cavity size was normal. Wall thickness was normal. Systolic function was normal. The estimated ejection fraction was in the range of 60% to 65%. Doppler parameters are consistent with abnormal left ventricular relaxation (grade 1 diastolic dysfunction). - Aortic valve: AV is thickened, calcified with mildly restricted motion. Peak and mean gradients through the valve are 19 and 10 mm Hg respectively. - Mitral valve: There was mild regurgitation. - Left atrium: The atrium was mildly dilated.  ECHO 2017: Compared to a prior echo in 2015, the aortic valve gradient is stable and more consistent with sclerosis, rather than aortic stenosis.  LVEF is higher at 65-70%. Reassuring. No aortic stenosis.  LABS: LDL 81, other lab work reviewed from 09/19/16  Lower extremity Dopplers 04/30/16-No evidence of DVT  ASSESSMENT AND PLAN:  1. Aortic stenosis-more sclerosis than stenosis, reassuring. Murmur appreciated, we will go ahead and repeat echocardiogram, and he has been 2 years. 2. Hypertension-multidrug regimen.   Doubled her Hyzaar at a prior visit to 100/25. Continue with amlodipine. She is no longer on meloxicam. Dr. Drema Dallas is watching as well. No changes made. Prior visit with Dr. Landry Mellow 130 SBP.  3. Family history coronary artery disease-Brother, father, nephew. Aggressive primary prevention, blood pressure control.  Weight loss.  4. Obesity-continue to encourage weight loss. Low carb.  5. Fatigue- Certainly regular exercise, weight loss, diet would be helpful. She is going to discuss with rheumatology about methotrexate as a potential reason. 6. One year followup  Signed, Candee Furbish, MD [facility], [place]  12/25/2017 3:06 PM

## 2017-12-25 NOTE — Patient Instructions (Signed)

## 2017-12-29 DIAGNOSIS — M47896 Other spondylosis, lumbar region: Secondary | ICD-10-CM | POA: Diagnosis not present

## 2017-12-29 DIAGNOSIS — M222X2 Patellofemoral disorders, left knee: Secondary | ICD-10-CM | POA: Diagnosis not present

## 2017-12-29 DIAGNOSIS — M545 Low back pain: Secondary | ICD-10-CM | POA: Diagnosis not present

## 2017-12-31 DIAGNOSIS — M47896 Other spondylosis, lumbar region: Secondary | ICD-10-CM | POA: Diagnosis not present

## 2017-12-31 DIAGNOSIS — M222X2 Patellofemoral disorders, left knee: Secondary | ICD-10-CM | POA: Diagnosis not present

## 2017-12-31 DIAGNOSIS — M545 Low back pain: Secondary | ICD-10-CM | POA: Diagnosis not present

## 2018-01-01 ENCOUNTER — Other Ambulatory Visit: Payer: Self-pay

## 2018-01-01 ENCOUNTER — Ambulatory Visit (HOSPITAL_COMMUNITY): Payer: Medicare Other | Attending: Cardiology

## 2018-01-01 DIAGNOSIS — I5030 Unspecified diastolic (congestive) heart failure: Secondary | ICD-10-CM | POA: Diagnosis not present

## 2018-01-01 DIAGNOSIS — I517 Cardiomegaly: Secondary | ICD-10-CM | POA: Insufficient documentation

## 2018-01-01 DIAGNOSIS — I429 Cardiomyopathy, unspecified: Secondary | ICD-10-CM | POA: Diagnosis not present

## 2018-01-01 DIAGNOSIS — R011 Cardiac murmur, unspecified: Secondary | ICD-10-CM | POA: Diagnosis not present

## 2018-01-01 DIAGNOSIS — I700 Atherosclerosis of aorta: Secondary | ICD-10-CM | POA: Diagnosis not present

## 2018-01-05 DIAGNOSIS — M545 Low back pain: Secondary | ICD-10-CM | POA: Diagnosis not present

## 2018-01-05 DIAGNOSIS — M47896 Other spondylosis, lumbar region: Secondary | ICD-10-CM | POA: Diagnosis not present

## 2018-01-05 DIAGNOSIS — M222X2 Patellofemoral disorders, left knee: Secondary | ICD-10-CM | POA: Diagnosis not present

## 2018-01-09 DIAGNOSIS — M545 Low back pain: Secondary | ICD-10-CM | POA: Diagnosis not present

## 2018-01-09 DIAGNOSIS — M47896 Other spondylosis, lumbar region: Secondary | ICD-10-CM | POA: Diagnosis not present

## 2018-01-09 DIAGNOSIS — M222X2 Patellofemoral disorders, left knee: Secondary | ICD-10-CM | POA: Diagnosis not present

## 2018-01-12 ENCOUNTER — Other Ambulatory Visit: Payer: Self-pay

## 2018-01-12 DIAGNOSIS — M47896 Other spondylosis, lumbar region: Secondary | ICD-10-CM | POA: Diagnosis not present

## 2018-01-12 DIAGNOSIS — M545 Low back pain: Secondary | ICD-10-CM | POA: Diagnosis not present

## 2018-01-12 DIAGNOSIS — M222X2 Patellofemoral disorders, left knee: Secondary | ICD-10-CM | POA: Diagnosis not present

## 2018-01-12 MED ORDER — LOSARTAN POTASSIUM-HCTZ 100-25 MG PO TABS: 1.0000 | ORAL_TABLET | Freq: Every day | ORAL | 3 refills | Status: DC

## 2018-01-12 NOTE — Telephone Encounter (Signed)
Received call from patient stating that she did not pick up Losartan-HCTZ from [facility] on 12/25/17; she no longer uses [facility] and wants [facility]. [facility]

## 2018-01-15 DIAGNOSIS — M222X2 Patellofemoral disorders, left knee: Secondary | ICD-10-CM | POA: Diagnosis not present

## 2018-01-15 DIAGNOSIS — M545 Low back pain: Secondary | ICD-10-CM | POA: Diagnosis not present

## 2018-01-15 DIAGNOSIS — M47896 Other spondylosis, lumbar region: Secondary | ICD-10-CM | POA: Diagnosis not present

## 2018-01-20 DIAGNOSIS — M222X2 Patellofemoral disorders, left knee: Secondary | ICD-10-CM | POA: Diagnosis not present

## 2018-01-20 DIAGNOSIS — M545 Low back pain: Secondary | ICD-10-CM | POA: Diagnosis not present

## 2018-01-20 DIAGNOSIS — M47896 Other spondylosis, lumbar region: Secondary | ICD-10-CM | POA: Diagnosis not present

## 2018-01-29 DIAGNOSIS — M810 Age-related osteoporosis without current pathological fracture: Secondary | ICD-10-CM | POA: Diagnosis not present

## 2018-01-29 DIAGNOSIS — Z6839 Body mass index (BMI) 39.0-39.9, adult: Secondary | ICD-10-CM | POA: Diagnosis not present

## 2018-01-29 DIAGNOSIS — M5136 Other intervertebral disc degeneration, lumbar region: Secondary | ICD-10-CM | POA: Diagnosis not present

## 2018-01-29 DIAGNOSIS — M150 Primary generalized (osteo)arthritis: Secondary | ICD-10-CM | POA: Diagnosis not present

## 2018-01-29 DIAGNOSIS — E669 Obesity, unspecified: Secondary | ICD-10-CM | POA: Diagnosis not present

## 2018-01-29 DIAGNOSIS — E119 Type 2 diabetes mellitus without complications: Secondary | ICD-10-CM | POA: Diagnosis not present

## 2018-01-29 DIAGNOSIS — L4059 Other psoriatic arthropathy: Secondary | ICD-10-CM | POA: Diagnosis not present

## 2018-01-29 DIAGNOSIS — E559 Vitamin D deficiency, unspecified: Secondary | ICD-10-CM | POA: Diagnosis not present

## 2018-02-26 DIAGNOSIS — R7303 Prediabetes: Secondary | ICD-10-CM | POA: Diagnosis not present

## 2018-02-26 DIAGNOSIS — Z8349 Family history of other endocrine, nutritional and metabolic diseases: Secondary | ICD-10-CM | POA: Diagnosis not present

## 2018-02-26 DIAGNOSIS — E0520 Thyrotoxicosis with toxic multinodular goiter without thyrotoxic crisis or storm: Secondary | ICD-10-CM | POA: Diagnosis not present

## 2018-02-26 DIAGNOSIS — Z923 Personal history of irradiation: Secondary | ICD-10-CM | POA: Diagnosis not present

## 2018-02-26 DIAGNOSIS — M810 Age-related osteoporosis without current pathological fracture: Secondary | ICD-10-CM | POA: Diagnosis not present

## 2018-02-26 DIAGNOSIS — K219 Gastro-esophageal reflux disease without esophagitis: Secondary | ICD-10-CM | POA: Diagnosis not present

## 2018-03-30 DIAGNOSIS — H43811 Vitreous degeneration, right eye: Secondary | ICD-10-CM | POA: Diagnosis not present

## 2018-03-30 DIAGNOSIS — Z961 Presence of intraocular lens: Secondary | ICD-10-CM | POA: Diagnosis not present

## 2018-03-30 DIAGNOSIS — H353132 Nonexudative age-related macular degeneration, bilateral, intermediate dry stage: Secondary | ICD-10-CM | POA: Diagnosis not present

## 2018-03-30 DIAGNOSIS — H43812 Vitreous degeneration, left eye: Secondary | ICD-10-CM | POA: Diagnosis not present

## 2018-04-29 DIAGNOSIS — M810 Age-related osteoporosis without current pathological fracture: Secondary | ICD-10-CM | POA: Diagnosis not present

## 2018-04-29 DIAGNOSIS — M5136 Other intervertebral disc degeneration, lumbar region: Secondary | ICD-10-CM | POA: Diagnosis not present

## 2018-04-29 DIAGNOSIS — Z6839 Body mass index (BMI) 39.0-39.9, adult: Secondary | ICD-10-CM | POA: Diagnosis not present

## 2018-04-29 DIAGNOSIS — M150 Primary generalized (osteo)arthritis: Secondary | ICD-10-CM | POA: Diagnosis not present

## 2018-04-29 DIAGNOSIS — E669 Obesity, unspecified: Secondary | ICD-10-CM | POA: Diagnosis not present

## 2018-04-29 DIAGNOSIS — L4059 Other psoriatic arthropathy: Secondary | ICD-10-CM | POA: Diagnosis not present

## 2018-04-29 DIAGNOSIS — E559 Vitamin D deficiency, unspecified: Secondary | ICD-10-CM | POA: Diagnosis not present

## 2018-05-15 DIAGNOSIS — Z23 Encounter for immunization: Secondary | ICD-10-CM | POA: Diagnosis not present

## 2018-06-10 DIAGNOSIS — I10 Essential (primary) hypertension: Secondary | ICD-10-CM | POA: Diagnosis not present

## 2018-06-10 DIAGNOSIS — E782 Mixed hyperlipidemia: Secondary | ICD-10-CM | POA: Diagnosis not present

## 2018-06-10 DIAGNOSIS — R350 Frequency of micturition: Secondary | ICD-10-CM | POA: Diagnosis not present

## 2018-06-10 DIAGNOSIS — H3530 Unspecified macular degeneration: Secondary | ICD-10-CM | POA: Diagnosis not present

## 2018-06-10 DIAGNOSIS — I350 Nonrheumatic aortic (valve) stenosis: Secondary | ICD-10-CM | POA: Diagnosis not present

## 2018-06-10 DIAGNOSIS — E559 Vitamin D deficiency, unspecified: Secondary | ICD-10-CM | POA: Diagnosis not present

## 2018-06-10 DIAGNOSIS — E119 Type 2 diabetes mellitus without complications: Secondary | ICD-10-CM | POA: Diagnosis not present

## 2018-06-15 DIAGNOSIS — E0520 Thyrotoxicosis with toxic multinodular goiter without thyrotoxic crisis or storm: Secondary | ICD-10-CM | POA: Diagnosis not present

## 2018-06-15 DIAGNOSIS — K219 Gastro-esophageal reflux disease without esophagitis: Secondary | ICD-10-CM | POA: Diagnosis not present

## 2018-06-15 DIAGNOSIS — Z8349 Family history of other endocrine, nutritional and metabolic diseases: Secondary | ICD-10-CM | POA: Diagnosis not present

## 2018-06-15 DIAGNOSIS — E871 Hypo-osmolality and hyponatremia: Secondary | ICD-10-CM | POA: Diagnosis not present

## 2018-06-15 DIAGNOSIS — R490 Dysphonia: Secondary | ICD-10-CM | POA: Diagnosis not present

## 2018-06-15 DIAGNOSIS — M810 Age-related osteoporosis without current pathological fracture: Secondary | ICD-10-CM | POA: Diagnosis not present

## 2018-06-15 DIAGNOSIS — J029 Acute pharyngitis, unspecified: Secondary | ICD-10-CM | POA: Diagnosis not present

## 2018-06-15 DIAGNOSIS — R05 Cough: Secondary | ICD-10-CM | POA: Diagnosis not present

## 2018-06-15 DIAGNOSIS — Z923 Personal history of irradiation: Secondary | ICD-10-CM | POA: Diagnosis not present

## 2018-06-16 ENCOUNTER — Other Ambulatory Visit (HOSPITAL_COMMUNITY): Payer: Self-pay | Admitting: Internal Medicine

## 2018-06-16 DIAGNOSIS — E0520 Thyrotoxicosis with toxic multinodular goiter without thyrotoxic crisis or storm: Secondary | ICD-10-CM

## 2018-06-23 DIAGNOSIS — E871 Hypo-osmolality and hyponatremia: Secondary | ICD-10-CM | POA: Diagnosis not present

## 2018-07-08 ENCOUNTER — Ambulatory Visit (HOSPITAL_COMMUNITY)
Admission: RE | Admit: 2018-07-08 | Discharge: 2018-07-08 | Disposition: A | Discharge status: Home / Self Care | Payer: Medicare Other | Source: Ambulatory Visit | Attending: Internal Medicine | Admitting: Internal Medicine

## 2018-07-08 ENCOUNTER — Encounter (HOSPITAL_COMMUNITY)
Admission: RE | Admit: 2018-07-08 | Discharge: 2018-07-08 | Disposition: A | Discharge status: Home / Self Care | Payer: Medicare Other | Source: Ambulatory Visit | Attending: Internal Medicine | Admitting: Internal Medicine

## 2018-07-08 DIAGNOSIS — E0520 Thyrotoxicosis with toxic multinodular goiter without thyrotoxic crisis or storm: Secondary | ICD-10-CM | POA: Insufficient documentation

## 2018-07-08 MED ORDER — SODIUM IODIDE I-123 7.4 MBQ CAPS
454.0000 | ORAL_CAPSULE | Freq: Once | ORAL | Status: AC
  Administered 2018-07-08: 454 via ORAL

## 2018-07-09 ENCOUNTER — Encounter (HOSPITAL_COMMUNITY)
Admission: RE | Admit: 2018-07-09 | Discharge: 2018-07-09 | Disposition: A | Discharge status: Home / Self Care | Payer: Medicare Other | Source: Ambulatory Visit | Attending: Internal Medicine | Admitting: Internal Medicine

## 2018-07-09 DIAGNOSIS — E042 Nontoxic multinodular goiter: Secondary | ICD-10-CM | POA: Diagnosis not present

## 2018-07-20 ENCOUNTER — Telehealth: Payer: Self-pay | Admitting: Cardiology

## 2018-07-20 MED ORDER — HYDROCHLOROTHIAZIDE 25 MG PO TABS: 25.0000 mg | ORAL_TABLET | Freq: Every day | ORAL | 3 refills | Status: DC

## 2018-07-20 MED ORDER — LOSARTAN POTASSIUM 100 MG PO TABS: 100.0000 mg | ORAL_TABLET | Freq: Every day | ORAL | 3 refills | Status: DC

## 2018-07-20 NOTE — Telephone Encounter (Signed)
Yes. Ok to separate Candee Furbish, MD

## 2018-07-20 NOTE — Telephone Encounter (Signed)
[facility] 2 [facility] Rx for losartan-HCTZ 100-25 mg tablet. Pharmacy would like Dr. Marlou Porch to separate the 2 medications because the losartan-HCTZ 100-25 mg tablet is on backorder and would like to know if Dr. Marlou Porch can send in losartan 100 mg tablet and HCTZ 25 mg tablets so pt can get her medications. Please address

## 2018-07-20 NOTE — Telephone Encounter (Signed)
OK to separate d/t national shortage

## 2018-07-27 ENCOUNTER — Other Ambulatory Visit (HOSPITAL_COMMUNITY): Payer: Self-pay | Admitting: Internal Medicine

## 2018-07-27 ENCOUNTER — Other Ambulatory Visit: Payer: Self-pay | Admitting: Internal Medicine

## 2018-07-27 DIAGNOSIS — E0590 Thyrotoxicosis, unspecified without thyrotoxic crisis or storm: Secondary | ICD-10-CM

## 2018-07-30 ENCOUNTER — Encounter (HOSPITAL_COMMUNITY): Admission: RE | Admit: 2018-07-30 | Payer: Medicare Other | Source: Ambulatory Visit

## 2018-08-07 ENCOUNTER — Encounter (HOSPITAL_COMMUNITY)
Admission: RE | Admit: 2018-08-07 | Discharge: 2018-08-07 | Disposition: A | Discharge status: Home / Self Care | Payer: Medicare Other | Source: Ambulatory Visit | Attending: Internal Medicine | Admitting: Internal Medicine

## 2018-08-07 DIAGNOSIS — E0590 Thyrotoxicosis, unspecified without thyrotoxic crisis or storm: Secondary | ICD-10-CM | POA: Diagnosis not present

## 2018-08-07 MED ORDER — SODIUM IODIDE I 131 CAPSULE
40.0000 | Freq: Once | INTRAVENOUS | Status: AC | PRN
  Administered 2018-08-07: 40 via ORAL

## 2018-08-20 DIAGNOSIS — M150 Primary generalized (osteo)arthritis: Secondary | ICD-10-CM | POA: Diagnosis not present

## 2018-08-20 DIAGNOSIS — E559 Vitamin D deficiency, unspecified: Secondary | ICD-10-CM | POA: Diagnosis not present

## 2018-08-20 DIAGNOSIS — E669 Obesity, unspecified: Secondary | ICD-10-CM | POA: Diagnosis not present

## 2018-08-20 DIAGNOSIS — Z6838 Body mass index (BMI) 38.0-38.9, adult: Secondary | ICD-10-CM | POA: Diagnosis not present

## 2018-08-20 DIAGNOSIS — L4059 Other psoriatic arthropathy: Secondary | ICD-10-CM | POA: Diagnosis not present

## 2018-08-20 DIAGNOSIS — M5136 Other intervertebral disc degeneration, lumbar region: Secondary | ICD-10-CM | POA: Diagnosis not present

## 2018-08-20 DIAGNOSIS — M810 Age-related osteoporosis without current pathological fracture: Secondary | ICD-10-CM | POA: Diagnosis not present

## 2018-09-02 DIAGNOSIS — E0590 Thyrotoxicosis, unspecified without thyrotoxic crisis or storm: Secondary | ICD-10-CM | POA: Diagnosis not present

## 2018-09-02 DIAGNOSIS — E0520 Thyrotoxicosis with toxic multinodular goiter without thyrotoxic crisis or storm: Secondary | ICD-10-CM | POA: Diagnosis not present

## 2018-09-02 DIAGNOSIS — Z923 Personal history of irradiation: Secondary | ICD-10-CM | POA: Diagnosis not present

## 2018-09-21 DIAGNOSIS — L4059 Other psoriatic arthropathy: Secondary | ICD-10-CM | POA: Diagnosis not present

## 2018-09-21 DIAGNOSIS — Z79899 Other long term (current) drug therapy: Secondary | ICD-10-CM | POA: Diagnosis not present

## 2018-10-01 DIAGNOSIS — E0590 Thyrotoxicosis, unspecified without thyrotoxic crisis or storm: Secondary | ICD-10-CM | POA: Diagnosis not present

## 2018-10-01 DIAGNOSIS — E0520 Thyrotoxicosis with toxic multinodular goiter without thyrotoxic crisis or storm: Secondary | ICD-10-CM | POA: Diagnosis not present

## 2018-10-15 ENCOUNTER — Other Ambulatory Visit: Payer: Self-pay | Admitting: Cardiology

## 2018-10-15 MED ORDER — VALSARTAN 160 MG PO TABS: 160.0000 mg | ORAL_TABLET | Freq: Every day | ORAL | 3 refills | Status: DC

## 2018-10-15 NOTE — Telephone Encounter (Signed)
Pt's pharmacy is requesting a alternative for losartan. Requesting a Valsartan, because Losartan is on back order. Please address

## 2018-11-26 DIAGNOSIS — E0590 Thyrotoxicosis, unspecified without thyrotoxic crisis or storm: Secondary | ICD-10-CM | POA: Diagnosis not present

## 2018-11-26 DIAGNOSIS — E0520 Thyrotoxicosis with toxic multinodular goiter without thyrotoxic crisis or storm: Secondary | ICD-10-CM | POA: Diagnosis not present

## 2018-12-04 ENCOUNTER — Encounter: Payer: Self-pay | Admitting: Podiatry

## 2018-12-04 ENCOUNTER — Ambulatory Visit (INDEPENDENT_AMBULATORY_CARE_PROVIDER_SITE_OTHER): Payer: Medicare Other | Admitting: Podiatry

## 2018-12-04 ENCOUNTER — Other Ambulatory Visit: Payer: Self-pay

## 2018-12-04 DIAGNOSIS — M79674 Pain in right toe(s): Secondary | ICD-10-CM | POA: Diagnosis not present

## 2018-12-04 DIAGNOSIS — B351 Tinea unguium: Secondary | ICD-10-CM | POA: Diagnosis not present

## 2018-12-04 DIAGNOSIS — M79675 Pain in left toe(s): Secondary | ICD-10-CM

## 2018-12-04 DIAGNOSIS — E119 Type 2 diabetes mellitus without complications: Secondary | ICD-10-CM | POA: Diagnosis not present

## 2018-12-04 NOTE — Progress Notes (Signed)
Complaint:  Visit Type: Patient returns to my office for continued preventative foot care services. Complaint: Patient states" my nails have grown long and thick and become painful to walk and wear shoes" Patient has been diagnosed with DM with no foot complications. The patient presents for preventative foot care services. No changes to ROS.  Patient says nails have not been done since her nail salon closed.  Podiatric Exam: Vascular: dorsalis pedis and posterior tibial pulses are palpable bilateral. Capillary return is immediate. Temperature gradient is WNL. Skin turgor WNL  Sensorium: Normal Semmes Weinstein monofilament test. Normal tactile sensation bilaterally. Nail Exam: Pt has thick disfigured discolored nails with subungual debris noted bilateral entire nail hallux  Ulcer Exam: There is no evidence of ulcer or pre-ulcerative changes or infection. Orthopedic Exam: Muscle tone and strength are WNL. No limitations in general ROM. No crepitus or effusions noted. Foot type and digits show no abnormalities. HAV  B/L  Midfoot arthritis. Skin: No Porokeratosis. No infection or ulcers  Diagnosis:  Onychomycosis, , Pain in right toe, pain in left toes  Treatment & Plan Procedures and Treatment: Consent by patient was obtained for treatment procedures.   Debridement of mycotic and hypertrophic toenails, 1 through 5 bilateral and clearing of subungual debris. No ulceration, no infection noted.  Return Visit-Office Procedure: Patient instructed to return to the office for a follow up visit 3 months for continued evaluation and treatment.    Gardiner Barefoot DPM

## 2018-12-08 ENCOUNTER — Telehealth: Payer: Self-pay

## 2018-12-08 NOTE — Telephone Encounter (Signed)

## 2018-12-09 ENCOUNTER — Encounter: Payer: Self-pay | Admitting: Cardiology

## 2018-12-09 ENCOUNTER — Telehealth (INDEPENDENT_AMBULATORY_CARE_PROVIDER_SITE_OTHER): Payer: Medicare Other | Admitting: Cardiology

## 2018-12-09 ENCOUNTER — Other Ambulatory Visit: Payer: Self-pay

## 2018-12-09 VITALS — BP 126/60 | Ht 60.0 in | Wt 197.0 lb

## 2018-12-09 DIAGNOSIS — M150 Primary generalized (osteo)arthritis: Secondary | ICD-10-CM | POA: Diagnosis not present

## 2018-12-09 DIAGNOSIS — L4059 Other psoriatic arthropathy: Secondary | ICD-10-CM | POA: Diagnosis not present

## 2018-12-09 DIAGNOSIS — I350 Nonrheumatic aortic (valve) stenosis: Secondary | ICD-10-CM | POA: Diagnosis not present

## 2018-12-09 DIAGNOSIS — I10 Essential (primary) hypertension: Secondary | ICD-10-CM

## 2018-12-09 DIAGNOSIS — M810 Age-related osteoporosis without current pathological fracture: Secondary | ICD-10-CM | POA: Diagnosis not present

## 2018-12-09 DIAGNOSIS — M0600 Rheumatoid arthritis without rheumatoid factor, unspecified site: Secondary | ICD-10-CM

## 2018-12-09 DIAGNOSIS — M5136 Other intervertebral disc degeneration, lumbar region: Secondary | ICD-10-CM | POA: Diagnosis not present

## 2018-12-09 DIAGNOSIS — E559 Vitamin D deficiency, unspecified: Secondary | ICD-10-CM | POA: Diagnosis not present

## 2018-12-09 NOTE — Patient Instructions (Signed)
Medication Instructions:  Your physician recommends that you continue on your current medications as directed. Please refer to the Current Medication list given to you today.   If you need a refill on your cardiac medications before your next appointment, please call your pharmacy.   Lab work: None If you have labs (blood work) drawn today and your tests are completely normal, you will receive your results only by: . MyChart Message (if you have MyChart) OR . A paper copy in the mail If you have any lab test that is abnormal or we need to change your treatment, we will call you to review the results.  Testing/Procedures: None  Follow-Up: At [facility], you and your health needs are our priority.  As part of our continuing mission to provide you with exceptional heart care, we have created designated Provider Care Teams.  These Care Teams include your primary Cardiologist (physician) and Advanced Practice Providers (APPs -  Physician Assistants and Nurse Practitioners) who all work together to provide you with the care you need, when you need it. You will need a follow up appointment in 12 months.  Please call our office 2 months in advance to schedule this appointment.  You may see Mark Skains, MD or one of the following Advanced Practice Providers on your designated Care Team:   Lori Gerhardt, NP Laura Ingold, NP . Jill McDaniel, NP  Any Other Special Instructions Will Be Listed Below (If Applicable).    

## 2018-12-09 NOTE — Progress Notes (Signed)
Virtual Visit via Telephone Note   This visit type was conducted due to national recommendations for restrictions regarding the COVID-19 Pandemic (e.g. social distancing) in an effort to limit this patient's exposure and mitigate transmission in our community.  Due to her co-morbid illnesses, this patient is at least at moderate risk for complications without adequate follow up.  This format is felt to be most appropriate for this patient at this time.  The patient did not have access to video technology/had technical difficulties with video requiring transitioning to audio format only (telephone).  All issues noted in this document were discussed and addressed.  No physical exam could be performed with this format.  Please refer to the patient's chart for her  consent to telehealth for Lisa [name].   Date:  12/09/2018   ID:  Lisa [name], DOB [birth year], MRN [id number]  Patient Location: Home Provider Location: Home  PCP:  Leighton Ruff, [name]  Cardiologist:  Candee Furbish, [name]  Electrophysiologist:  None   Evaluation Performed:  Follow-Up Visit  Chief Complaint: Aortic stenosis follow-up  History of Present Illness:    Lisa [name] is a 83 y.o. female with hypertension, obesity, borderline diabetes, family history of CAD, mild aortic stenosis here for follow up visit.    To quote prior visits: Has had radiation treatment in 2012  behind right ear to stop growth of tumor in brain, deaf in right ear. This is currently the size of a thumbnail, discovered when she had an MRI to consider implant with Dr. Elwyn Reach. [place] was in 2012. Dr. Danne Baxter.   Very similar complaints when compared to prior visit, fatigue, headache. She wonders if it's potentially methotrexate. Headache left-sided at one point. [employer]. Water aerobics, has not been going as frequently as she used to. Trying to lose weight.    Brother had CABG 20 years ago.  Has aortic murmur which echocardiogram shows  aortic sclerosis. Radiation to the carotids. Carotid Doppler was normal.   One new symptom she states is that she has had some chest pressure at times radiating across her chest wall nonexertional, lasting a few minutes duration, relieved with rest.  12/25/2017- note reviewed as above.  She is not having any further chest discomfort.  She still is having fatigue.  Does not need thyroid medication at this point.  Trying to exercise as best as possible.  Using cane for stabilization given her back and knee pain.  No shortness of breath, no syncope, no bleeding.  12/09/18 - aortic stenosis follow up. Temp normal. Lisa [name] this morning. [employer] water exercises. On hold.  Overall she feels well, getting along just fine she states.  No chest pain no shortness of breath no fevers no chills.  The patient does not have symptoms concerning for COVID-19 infection (fever, chills, cough, or new shortness of breath).    Past Medical History:  Diagnosis Date  . Arthritis   . DDD (degenerative disc disease), cervical   . Deafness in right ear   . Diverticulosis   . Hearing loss in left ear   . Heart murmur   . Kidney stones   . Psoriasis    Past Surgical History:  Procedure Laterality Date  . APPENDECTOMY    . BILATERAL SALPINGOOPHORECTOMY  1996  . CATARACT EXTRACTION    . CHOLECYSTECTOMY  2002  . COLONOSCOPY  2003/2009/2015  . TOTAL ABDOMINAL HYSTERECTOMY  1979   FIBROIDS      Current Meds  Medication Sig  .  acetaminophen (TYLENOL) 325 MG tablet Take 650 mg by mouth every 6 (six) hours as needed for mild pain or moderate pain. 3/4 tabs daily if needed  . amLODipine (NORVASC) 5 MG tablet Take 5 mg by mouth daily.   [name] Kitchen aspirin 81 MG tablet Take 81 mg by mouth every other day.   . Calcium Carbonate-Vit D-Min (CALTRATE PLUS PO) Take 1 tablet by mouth 2 (two) times daily.   . Carboxymethylcellulose Sod PF (REFRESH PLUS) 0.5 % SOLN 1 drop 2 (two) times daily as needed.  . Cholecalciferol (VITAMIN D) 2000  units tablet Take 2,000 Units by mouth daily.  . Cyanocobalamin (B-12) 1000 MCG CAPS Take by mouth daily.  . fluticasone (FLONASE) 50 MCG/ACT nasal spray Place 1 spray into both nostrils daily.   . folic acid (FOLVITE) 1 MG tablet Take 1 mg by mouth daily.   . hydrochlorothiazide (HYDRODIURIL) 25 MG tablet Take 1 tablet (25 mg total) by mouth daily.  [name] Kitchen losartan-hydrochlorothiazide (HYZAAR) 100-25 MG tablet Take 1 tablet by mouth daily.   . meloxicam (MOBIC) 15 MG tablet Take 15 mg by mouth daily.  . methotrexate (RHEUMATREX) 2.5 MG tablet Take 10 mg by mouth 2 (two) times a week.   . nystatin cream (MYCOSTATIN) APPLY TO AFFECTED AREA AS NEEDED FOR 7 DAYS  . olopatadine (PATANOL) 0.1 % ophthalmic solution Place 1 drop into both eyes as needed for allergies.  [name] Kitchen omeprazole (PRILOSEC) 20 MG capsule   . Polyethyl Glycol-Propyl Glycol (SYSTANE OP) Apply to eye as needed.  . sodium chloride (OCEAN) 0.65 % SOLN nasal spray Place 1 spray into both nostrils as needed for congestion.  . valsartan (DIOVAN) 160 MG tablet Take 1 tablet (160 mg total) by mouth daily.     Allergies:   Lisinopril and Penicillins   Social History   Tobacco Use  . Smoking status: Former Research scientist (life sciences)  . Smokeless tobacco: Never Used  Substance Use Topics  . Alcohol use: Yes    Comment: WINE  . Drug use: No     Family Hx: The patient's family history includes Goiter in her brother and mother; Heart disease in her brother, brother, father, mother, and paternal grandfather.  ROS:   Please see the history of present illness.     All other systems reviewed and are negative.   Prior CV studies:   The following studies were reviewed today:  Echocardiogram 2019-mild aortic stenosis normal EF  Labs/Other Tests and Data Reviewed:    EKG:  An ECG dated 12/25/17 was personally reviewed today and demonstrated:  Sinus rhythm  Recent Labs: No results found for requested labs within last 8760 hours.   Recent Lipid Panel No  results found for: CHOL, TRIG, HDL, CHOLHDL, LDLCALC, LDLDIRECT  Wt Readings from Last 3 Encounters:  12/09/18 197 lb (89.4 kg)  12/25/17 201 lb 12.8 oz (91.5 kg)  12/23/16 200 lb 9.6 oz (91 kg)     Objective:    Vital Signs:  BP 126/60   Ht 5' (1.524 m)   Wt 197 lb (89.4 kg)   LMP  (LMP Unknown)   BMI 38.47 kg/m    VITAL SIGNS:  reviewed Pleasant, alert, normal respiratory effort  ASSESSMENT & PLAN:    1. Aortic stenosis-mild on echocardiogram 2019. 2. Hypertension-multidrug regimen.   Doubled her Hyzaar at a prior visit to 100/25. Continue with amlodipine. She is no longer on meloxicam. Dr. Drema Dallas is watching as well. No changes made. Prior visit with Dr. Landry Mellow 130 SBP.  3. Family history coronary artery disease-Brother, father, 3 nephews. Aggressive primary prevention, blood pressure control. Weight loss.  4. Obesity-continue to encourage weight loss. Low carb.  5. Lisa/Fatigue- Certainly regular exercise, weight loss, diet would be helpful. She is going to discuss with rheumatology about methotrexate as a potential reason. Thyroid levels are also normal now, Had RAI ablation. Helped.  6. Palptations - mild 1 sec in 3 months Doing well now.  7. One year followup  COVID-19 Education: The signs and symptoms of COVID-19 were discussed with the patient and how to seek care for testing (follow up with PCP or arrange E-visit).  The importance of social distancing was discussed today.  Time:   Today, I have spent 11 minutes with the patient with telehealth technology discussing the above problems.     Medication Adjustments/Labs and Tests Ordered: Current medicines are reviewed at length with the patient today.  Concerns regarding medicines are outlined above.   Tests Ordered: No orders of the defined types were placed in this encounter.   Medication Changes: No orders of the defined types were placed in this encounter.   Disposition:  Follow up in 1 year(s)  Signed, Candee Furbish, [name]  12/09/2018 3:55 PM    [place]

## 2018-12-28 ENCOUNTER — Ambulatory Visit: Payer: Medicare Other | Admitting: Cardiology

## 2018-12-30 ENCOUNTER — Ambulatory Visit: Payer: Medicare Other | Admitting: Cardiology

## 2018-12-31 DIAGNOSIS — E119 Type 2 diabetes mellitus without complications: Secondary | ICD-10-CM | POA: Diagnosis not present

## 2018-12-31 DIAGNOSIS — E0520 Thyrotoxicosis with toxic multinodular goiter without thyrotoxic crisis or storm: Secondary | ICD-10-CM | POA: Diagnosis not present

## 2018-12-31 DIAGNOSIS — Z923 Personal history of irradiation: Secondary | ICD-10-CM | POA: Diagnosis not present

## 2019-03-03 DIAGNOSIS — M545 Low back pain: Secondary | ICD-10-CM | POA: Diagnosis not present

## 2019-03-16 DIAGNOSIS — D485 Neoplasm of uncertain behavior of skin: Secondary | ICD-10-CM | POA: Diagnosis not present

## 2019-03-16 DIAGNOSIS — L820 Inflamed seborrheic keratosis: Secondary | ICD-10-CM | POA: Diagnosis not present

## 2019-03-16 DIAGNOSIS — L304 Erythema intertrigo: Secondary | ICD-10-CM | POA: Diagnosis not present

## 2019-03-16 DIAGNOSIS — L4059 Other psoriatic arthropathy: Secondary | ICD-10-CM | POA: Diagnosis not present

## 2019-03-16 DIAGNOSIS — L821 Other seborrheic keratosis: Secondary | ICD-10-CM | POA: Diagnosis not present

## 2019-03-16 DIAGNOSIS — D2239 Melanocytic nevi of other parts of face: Secondary | ICD-10-CM | POA: Diagnosis not present

## 2019-03-16 DIAGNOSIS — L400 Psoriasis vulgaris: Secondary | ICD-10-CM | POA: Diagnosis not present

## 2019-03-16 DIAGNOSIS — L720 Epidermal cyst: Secondary | ICD-10-CM | POA: Diagnosis not present

## 2019-03-26 ENCOUNTER — Other Ambulatory Visit: Payer: Self-pay | Admitting: Cardiology

## 2019-04-01 DIAGNOSIS — H43812 Vitreous degeneration, left eye: Secondary | ICD-10-CM | POA: Diagnosis not present

## 2019-04-01 DIAGNOSIS — Z961 Presence of intraocular lens: Secondary | ICD-10-CM | POA: Diagnosis not present

## 2019-04-01 DIAGNOSIS — H43811 Vitreous degeneration, right eye: Secondary | ICD-10-CM | POA: Diagnosis not present

## 2019-04-01 DIAGNOSIS — H353132 Nonexudative age-related macular degeneration, bilateral, intermediate dry stage: Secondary | ICD-10-CM | POA: Diagnosis not present

## 2019-04-08 DIAGNOSIS — E782 Mixed hyperlipidemia: Secondary | ICD-10-CM | POA: Diagnosis not present

## 2019-04-08 DIAGNOSIS — I10 Essential (primary) hypertension: Secondary | ICD-10-CM | POA: Diagnosis not present

## 2019-04-08 DIAGNOSIS — M1990 Unspecified osteoarthritis, unspecified site: Secondary | ICD-10-CM | POA: Diagnosis not present

## 2019-04-08 DIAGNOSIS — E119 Type 2 diabetes mellitus without complications: Secondary | ICD-10-CM | POA: Diagnosis not present

## 2019-04-08 DIAGNOSIS — M810 Age-related osteoporosis without current pathological fracture: Secondary | ICD-10-CM | POA: Diagnosis not present

## 2019-04-09 ENCOUNTER — Ambulatory Visit: Payer: Medicare Other | Admitting: Podiatry

## 2019-04-13 DIAGNOSIS — M810 Age-related osteoporosis without current pathological fracture: Secondary | ICD-10-CM | POA: Diagnosis not present

## 2019-04-13 DIAGNOSIS — L4059 Other psoriatic arthropathy: Secondary | ICD-10-CM | POA: Diagnosis not present

## 2019-04-13 DIAGNOSIS — M150 Primary generalized (osteo)arthritis: Secondary | ICD-10-CM | POA: Diagnosis not present

## 2019-04-13 DIAGNOSIS — M5136 Other intervertebral disc degeneration, lumbar region: Secondary | ICD-10-CM | POA: Diagnosis not present

## 2019-04-13 DIAGNOSIS — E559 Vitamin D deficiency, unspecified: Secondary | ICD-10-CM | POA: Diagnosis not present

## 2019-04-18 DIAGNOSIS — Z23 Encounter for immunization: Secondary | ICD-10-CM | POA: Diagnosis not present

## 2019-04-28 DIAGNOSIS — H903 Sensorineural hearing loss, bilateral: Secondary | ICD-10-CM | POA: Diagnosis not present

## 2019-05-07 DIAGNOSIS — H353131 Nonexudative age-related macular degeneration, bilateral, early dry stage: Secondary | ICD-10-CM | POA: Diagnosis not present

## 2019-05-07 DIAGNOSIS — H43393 Other vitreous opacities, bilateral: Secondary | ICD-10-CM | POA: Diagnosis not present

## 2019-05-07 DIAGNOSIS — Z961 Presence of intraocular lens: Secondary | ICD-10-CM | POA: Diagnosis not present

## 2019-05-07 DIAGNOSIS — H04123 Dry eye syndrome of bilateral lacrimal glands: Secondary | ICD-10-CM | POA: Diagnosis not present

## 2019-05-07 DIAGNOSIS — H524 Presbyopia: Secondary | ICD-10-CM | POA: Diagnosis not present

## 2019-05-07 DIAGNOSIS — H5213 Myopia, bilateral: Secondary | ICD-10-CM | POA: Diagnosis not present

## 2019-05-07 DIAGNOSIS — H52223 Regular astigmatism, bilateral: Secondary | ICD-10-CM | POA: Diagnosis not present

## 2019-06-09 DIAGNOSIS — H90A22 Sensorineural hearing loss, unilateral, left ear, with restricted hearing on the contralateral side: Secondary | ICD-10-CM | POA: Insufficient documentation

## 2019-06-14 DIAGNOSIS — M150 Primary generalized (osteo)arthritis: Secondary | ICD-10-CM | POA: Diagnosis not present

## 2019-06-14 DIAGNOSIS — Z6837 Body mass index (BMI) 37.0-37.9, adult: Secondary | ICD-10-CM | POA: Diagnosis not present

## 2019-06-14 DIAGNOSIS — E669 Obesity, unspecified: Secondary | ICD-10-CM | POA: Diagnosis not present

## 2019-06-14 DIAGNOSIS — M810 Age-related osteoporosis without current pathological fracture: Secondary | ICD-10-CM | POA: Diagnosis not present

## 2019-06-14 DIAGNOSIS — E559 Vitamin D deficiency, unspecified: Secondary | ICD-10-CM | POA: Diagnosis not present

## 2019-06-14 DIAGNOSIS — L4059 Other psoriatic arthropathy: Secondary | ICD-10-CM | POA: Diagnosis not present

## 2019-06-14 DIAGNOSIS — M5136 Other intervertebral disc degeneration, lumbar region: Secondary | ICD-10-CM | POA: Diagnosis not present

## 2019-07-01 DIAGNOSIS — Z90710 Acquired absence of both cervix and uterus: Secondary | ICD-10-CM | POA: Diagnosis not present

## 2019-07-01 DIAGNOSIS — Z8262 Family history of osteoporosis: Secondary | ICD-10-CM | POA: Diagnosis not present

## 2019-07-01 DIAGNOSIS — Z7952 Long term (current) use of systemic steroids: Secondary | ICD-10-CM | POA: Diagnosis not present

## 2019-07-01 DIAGNOSIS — L4050 Arthropathic psoriasis, unspecified: Secondary | ICD-10-CM | POA: Diagnosis not present

## 2019-07-01 DIAGNOSIS — M810 Age-related osteoporosis without current pathological fracture: Secondary | ICD-10-CM | POA: Diagnosis not present

## 2019-07-01 DIAGNOSIS — Z1231 Encounter for screening mammogram for malignant neoplasm of breast: Secondary | ICD-10-CM | POA: Diagnosis not present

## 2019-07-29 DIAGNOSIS — E782 Mixed hyperlipidemia: Secondary | ICD-10-CM | POA: Diagnosis not present

## 2019-07-29 DIAGNOSIS — E1169 Type 2 diabetes mellitus with other specified complication: Secondary | ICD-10-CM | POA: Diagnosis not present

## 2019-07-29 DIAGNOSIS — Z923 Personal history of irradiation: Secondary | ICD-10-CM | POA: Diagnosis not present

## 2019-07-29 DIAGNOSIS — E119 Type 2 diabetes mellitus without complications: Secondary | ICD-10-CM | POA: Diagnosis not present

## 2019-07-29 DIAGNOSIS — M810 Age-related osteoporosis without current pathological fracture: Secondary | ICD-10-CM | POA: Diagnosis not present

## 2019-07-29 DIAGNOSIS — E0520 Thyrotoxicosis with toxic multinodular goiter without thyrotoxic crisis or storm: Secondary | ICD-10-CM | POA: Diagnosis not present

## 2019-07-29 DIAGNOSIS — E559 Vitamin D deficiency, unspecified: Secondary | ICD-10-CM | POA: Diagnosis not present

## 2019-07-29 DIAGNOSIS — I10 Essential (primary) hypertension: Secondary | ICD-10-CM | POA: Diagnosis not present

## 2019-07-29 DIAGNOSIS — R350 Frequency of micturition: Secondary | ICD-10-CM | POA: Diagnosis not present

## 2019-08-17 ENCOUNTER — Ambulatory Visit: Payer: Medicare Other

## 2019-08-17 DIAGNOSIS — E782 Mixed hyperlipidemia: Secondary | ICD-10-CM | POA: Diagnosis not present

## 2019-08-17 DIAGNOSIS — I10 Essential (primary) hypertension: Secondary | ICD-10-CM | POA: Diagnosis not present

## 2019-08-17 DIAGNOSIS — E559 Vitamin D deficiency, unspecified: Secondary | ICD-10-CM | POA: Diagnosis not present

## 2019-08-17 DIAGNOSIS — E119 Type 2 diabetes mellitus without complications: Secondary | ICD-10-CM | POA: Diagnosis not present

## 2019-08-17 DIAGNOSIS — E1169 Type 2 diabetes mellitus with other specified complication: Secondary | ICD-10-CM | POA: Diagnosis not present

## 2019-08-17 DIAGNOSIS — E876 Hypokalemia: Secondary | ICD-10-CM | POA: Diagnosis not present

## 2019-08-17 DIAGNOSIS — Z923 Personal history of irradiation: Secondary | ICD-10-CM | POA: Diagnosis not present

## 2019-08-17 DIAGNOSIS — R350 Frequency of micturition: Secondary | ICD-10-CM | POA: Diagnosis not present

## 2019-08-17 DIAGNOSIS — M810 Age-related osteoporosis without current pathological fracture: Secondary | ICD-10-CM | POA: Diagnosis not present

## 2019-08-17 DIAGNOSIS — E0520 Thyrotoxicosis with toxic multinodular goiter without thyrotoxic crisis or storm: Secondary | ICD-10-CM | POA: Diagnosis not present

## 2019-08-26 ENCOUNTER — Ambulatory Visit: Payer: Medicare Other

## 2019-09-14 DIAGNOSIS — M810 Age-related osteoporosis without current pathological fracture: Secondary | ICD-10-CM | POA: Diagnosis not present

## 2019-09-14 DIAGNOSIS — Z6836 Body mass index (BMI) 36.0-36.9, adult: Secondary | ICD-10-CM | POA: Diagnosis not present

## 2019-09-14 DIAGNOSIS — M5136 Other intervertebral disc degeneration, lumbar region: Secondary | ICD-10-CM | POA: Diagnosis not present

## 2019-09-14 DIAGNOSIS — L4059 Other psoriatic arthropathy: Secondary | ICD-10-CM | POA: Diagnosis not present

## 2019-09-14 DIAGNOSIS — M150 Primary generalized (osteo)arthritis: Secondary | ICD-10-CM | POA: Diagnosis not present

## 2019-09-14 DIAGNOSIS — E669 Obesity, unspecified: Secondary | ICD-10-CM | POA: Diagnosis not present

## 2019-09-14 DIAGNOSIS — E559 Vitamin D deficiency, unspecified: Secondary | ICD-10-CM | POA: Diagnosis not present

## 2019-09-15 DIAGNOSIS — D447 Neoplasm of uncertain behavior of aortic body and other paraganglia: Secondary | ICD-10-CM | POA: Diagnosis not present

## 2019-09-19 IMAGING — MR MR HEAD WO/W CM
11 of 12 series · 37 of 48 positions shown · IV contrast (multihance)
Comparison: 11/12/2011

CLINICAL DATA: Glomus tympanicum tumor. Status post gamma knife in
0783.

EXAM:
MRI HEAD WITHOUT AND WITH CONTRAST
TECHNIQUE: Multiplanar, multiecho pulse sequences of the brain and surrounding
structures were obtained without and with intravenous contrast.
Creatinine was obtained on site at [HOSPITAL] at [HOSPITAL].
Results: Creatinine 0.5 mg/dL.
CONTRAST:  20mL MULTIHANCE GADOBENATE DIMEGLUMINE 529 MG/ML IV SOLN

[Series 2: T1 · sagittal · 5.0mm · 0.45mm/px · 2 of 23 slices shown (1 of 4)]
[im 1/23]
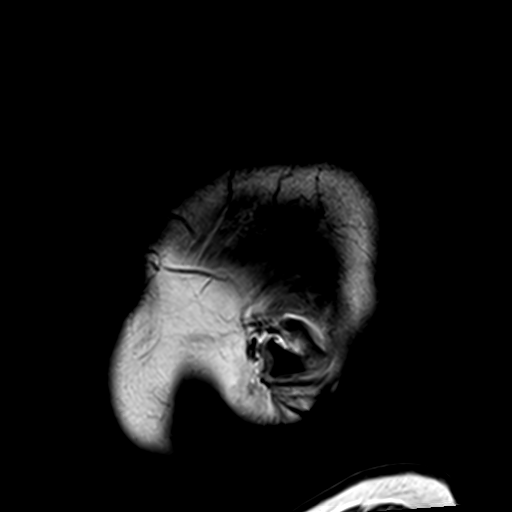
[im 23/23]
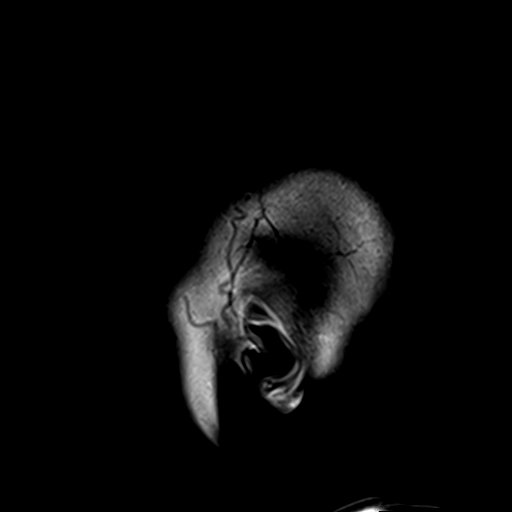

[Series 3: ep2d_diff_(id)_trace · axial · 3.0mm · 1.80mm/px · z∈[-64,+83]mm · 8 of 97 slices shown]
[im 1/97]
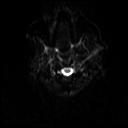
[im 11/97]
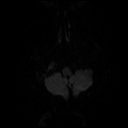
[im 33/97]
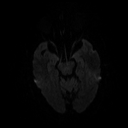
[im 43/97]
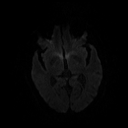
[im 54/97]
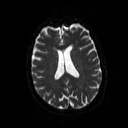
[im 65/97]
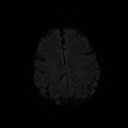
[im 86/97]
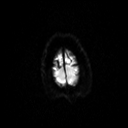
[im 97/97]
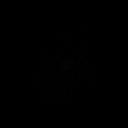

[Series 4: ep2d_diff_(id)_trace_adc · axial · 3.0mm · 1.80mm/px · z∈[-64,+83]mm · 5 of 48 slices shown]
[im 1/48]
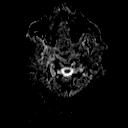
[im 12/48]
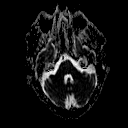
[im 24/48]
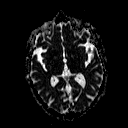
[im 36/48]
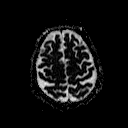
[im 48/48]
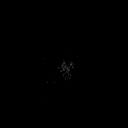

[Series 6: swi_images · axial · 2.0mm · 0.90mm/px · z∈[-65,+92]mm · 8 of 80 slices shown]
[im 1/80]
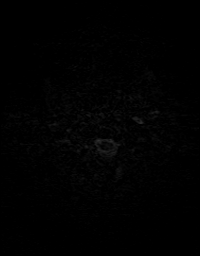
[im 12/80]
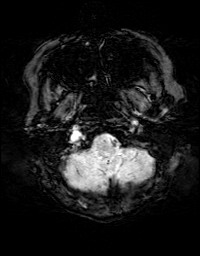
[im 23/80]
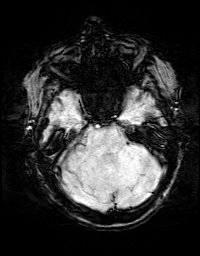
[im 34/80]
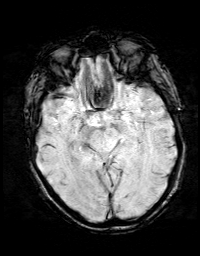
[im 46/80]
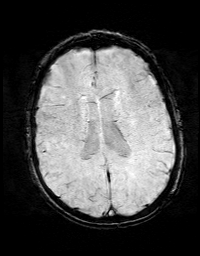
[im 57/80]
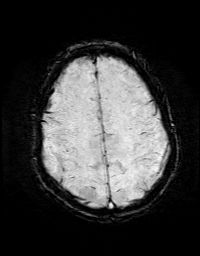
[im 68/80]
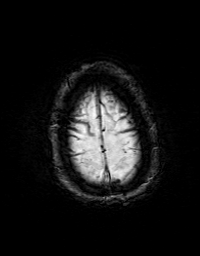
[im 80/80]
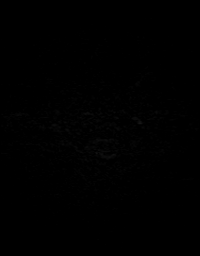

[Series 7: FLAIR · axial · 3.0mm · 0.43mm/px · z∈[-63,+81]mm · 3 of 25 slices shown]
[im 1/25]
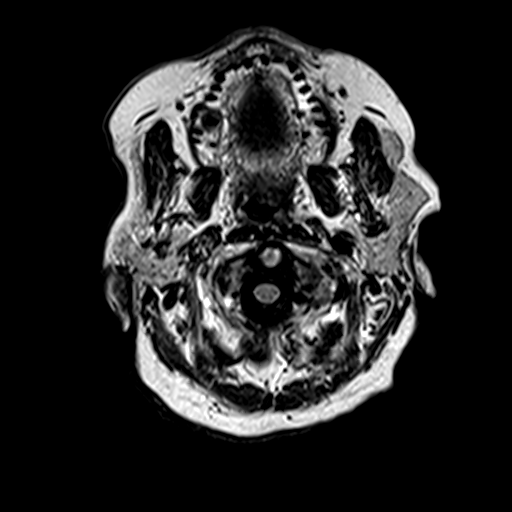
[im 13/25]
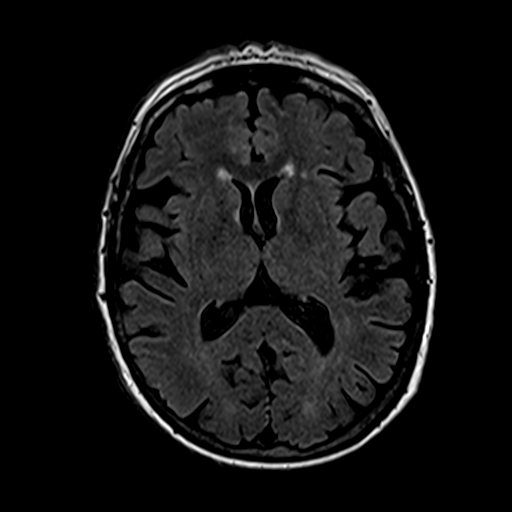
[im 25/25]
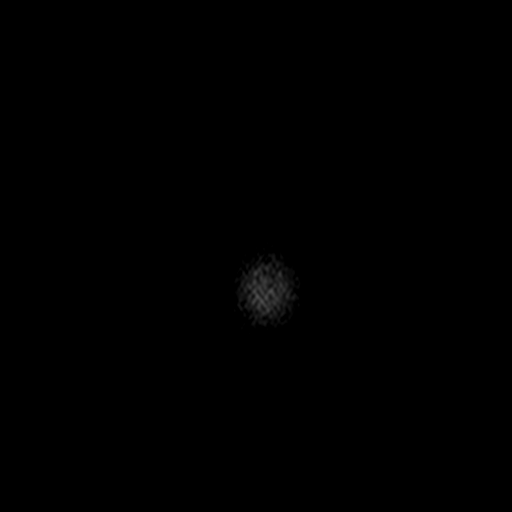

[Series 8: T2 · axial · 5.0mm · 0.45mm/px · z∈[-65,+85]mm · 3 of 25 slices shown]
[im 1/25]
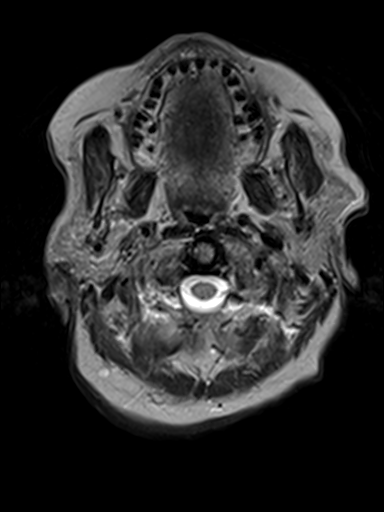
[im 13/25]
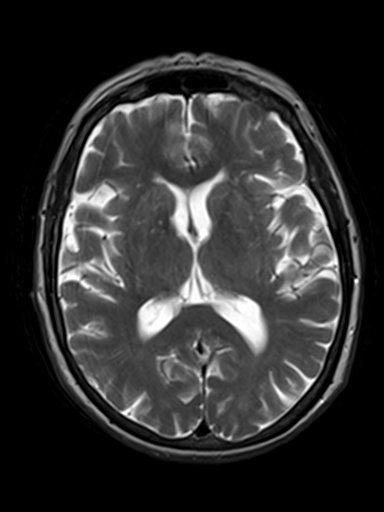
[im 25/25]
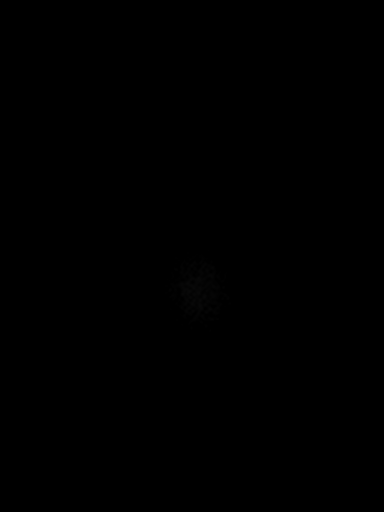

[Series 9: T1 · coronal · 3.0mm · 0.35mm/px · 1 of 11 slices shown (2 of 4)]
[im 1/11]
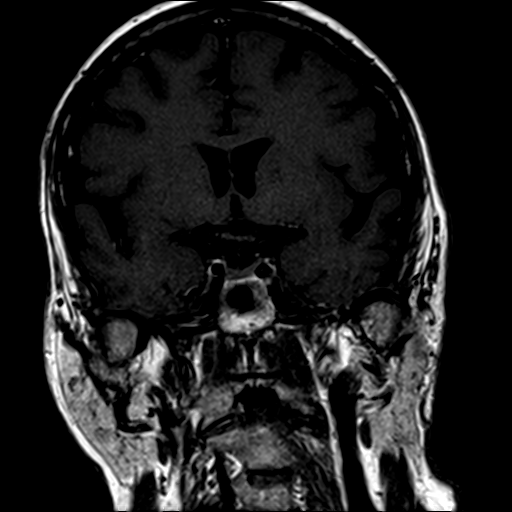

[Series 10: T1 · axial · 3.0mm · 0.35mm/px · 1 of 11 slices shown (3 of 4)]
[im 1/11]
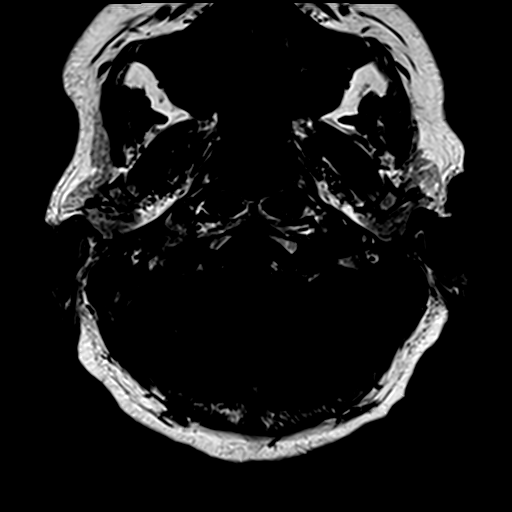

[Series 11: bSSFP · axial · 0.7mm · 0.28mm/px · z∈[-52,-30]mm · 4 of 44 slices shown]
[im 1/44]
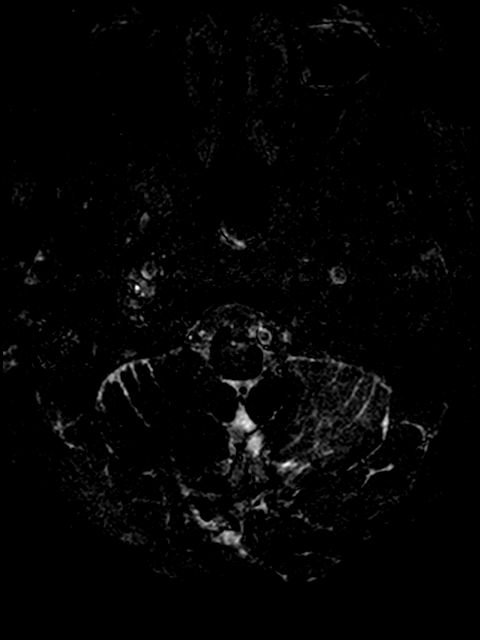
[im 11/44]
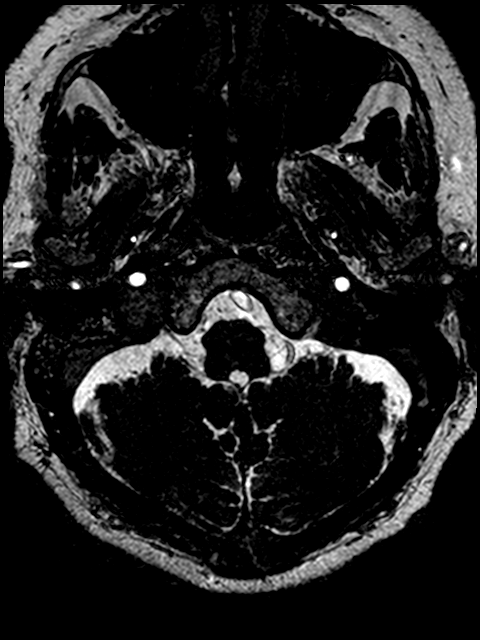
[im 22/44]
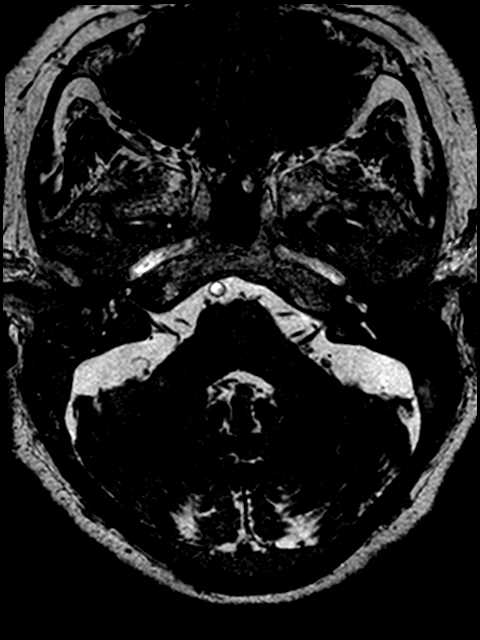
[im 33/44]
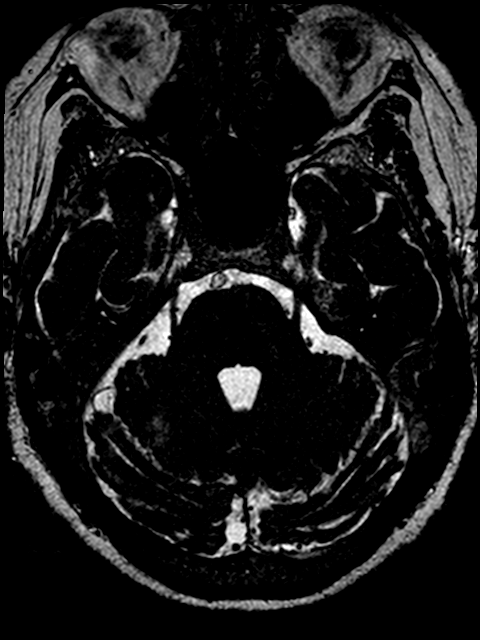

[Series 12: T1 · coronal · 3.0mm · 0.35mm/px · 1 of 11 slices shown (4 of 4)]
[im 1/11]
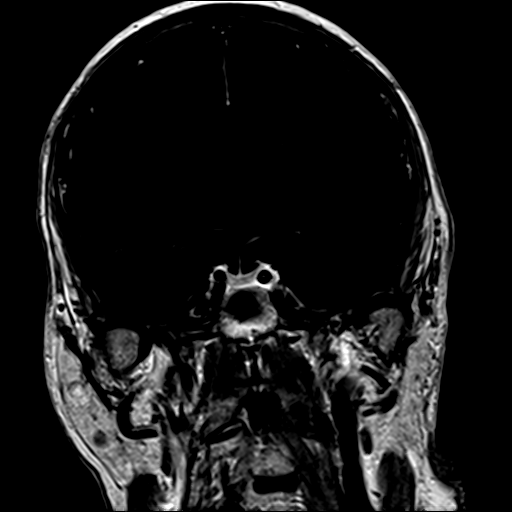

[Series 13: T1 post-contrast · axial · 3.0mm · 0.35mm/px · 1 of 11 slices shown]
[im 1/11]
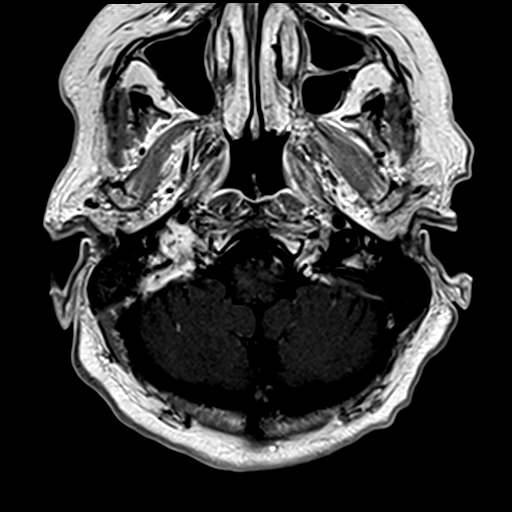

[37 of 48 positions shown; findings below may reference images not displayed]

FINDINGS: Known enhancing mildly T1 and T2 hyperintense mass centered at the
right jugular foramen with bony erosion the level of the foramen and
jugular tubercle. The mass has unchanged dimensions, 22 x 17 x 16
mm. The mass extends inferiorly along the upper carotid sheath with
anterior displacement of the patent ICA. The right jugular vein is
indistinguishable from the mass at the foramen but the right
transverse and sigmoid dural venous sinuses are patent. There is new
partial right mastoid opacification which could be related to
interval treatment. The mass extends to the exit of the right
hypoglossal canal. No abnormal enhancement in the cisterns. No
findings of tongue denervation.

Few signal abnormalities in the cerebral white matter that are age
normal. Normal brain volume. No infarct, hemorrhage, or
hydrocephalus.
IMPRESSION: 1. Right glomus jugulare status post radiotherapy. Tumor dimensions
and extent are stable from 0783.
2. New right partial mastoid opacification.

## 2019-09-21 DIAGNOSIS — L4059 Other psoriatic arthropathy: Secondary | ICD-10-CM | POA: Diagnosis not present

## 2019-09-23 DIAGNOSIS — H6531 Chronic mucoid otitis media, right ear: Secondary | ICD-10-CM | POA: Diagnosis not present

## 2019-09-23 DIAGNOSIS — D447 Neoplasm of uncertain behavior of aortic body and other paraganglia: Secondary | ICD-10-CM | POA: Diagnosis not present

## 2019-09-23 DIAGNOSIS — Z974 Presence of external hearing-aid: Secondary | ICD-10-CM | POA: Diagnosis not present

## 2019-09-23 DIAGNOSIS — L4050 Arthropathic psoriasis, unspecified: Secondary | ICD-10-CM | POA: Diagnosis not present

## 2019-09-23 DIAGNOSIS — H90A22 Sensorineural hearing loss, unilateral, left ear, with restricted hearing on the contralateral side: Secondary | ICD-10-CM | POA: Diagnosis not present

## 2019-09-23 DIAGNOSIS — Z87891 Personal history of nicotine dependence: Secondary | ICD-10-CM | POA: Diagnosis not present

## 2019-10-04 DIAGNOSIS — E1169 Type 2 diabetes mellitus with other specified complication: Secondary | ICD-10-CM | POA: Diagnosis not present

## 2019-10-04 DIAGNOSIS — M810 Age-related osteoporosis without current pathological fracture: Secondary | ICD-10-CM | POA: Diagnosis not present

## 2019-10-04 DIAGNOSIS — E782 Mixed hyperlipidemia: Secondary | ICD-10-CM | POA: Diagnosis not present

## 2019-10-04 DIAGNOSIS — M1990 Unspecified osteoarthritis, unspecified site: Secondary | ICD-10-CM | POA: Diagnosis not present

## 2019-10-04 DIAGNOSIS — I10 Essential (primary) hypertension: Secondary | ICD-10-CM | POA: Diagnosis not present

## 2019-11-09 ENCOUNTER — Ambulatory Visit: Payer: Medicare Other | Admitting: Podiatry

## 2019-11-12 ENCOUNTER — Telehealth: Payer: Self-pay | Admitting: Cardiology

## 2019-11-12 NOTE — Telephone Encounter (Signed)
   Went to chart to check if there's any follow up on pt's request, a form needs to fill up by Dr. Marlou Porch for her insurance, she said she hasn't send it and will drop it off on Monday morning

## 2019-11-15 ENCOUNTER — Telehealth: Payer: Self-pay | Admitting: *Deleted

## 2019-11-15 DIAGNOSIS — E1169 Type 2 diabetes mellitus with other specified complication: Secondary | ICD-10-CM | POA: Diagnosis not present

## 2019-11-15 DIAGNOSIS — E782 Mixed hyperlipidemia: Secondary | ICD-10-CM | POA: Diagnosis not present

## 2019-11-15 DIAGNOSIS — I10 Essential (primary) hypertension: Secondary | ICD-10-CM | POA: Diagnosis not present

## 2019-11-15 DIAGNOSIS — M810 Age-related osteoporosis without current pathological fracture: Secondary | ICD-10-CM | POA: Diagnosis not present

## 2019-11-15 DIAGNOSIS — M1990 Unspecified osteoarthritis, unspecified site: Secondary | ICD-10-CM | POA: Diagnosis not present

## 2019-11-15 NOTE — Telephone Encounter (Signed)
Pt dropped off [place] with [employer]. Will leave in Dr. Marlou Porch box for Pam to look at and fill out.

## 2019-11-16 NOTE — Telephone Encounter (Signed)
After reviewing this document was given to medical records with a release form filled out.

## 2019-11-22 ENCOUNTER — Telehealth: Payer: Self-pay | Admitting: Cardiology

## 2019-11-22 NOTE — Telephone Encounter (Signed)
Reviewed pt's chart - found documentation by Juanda Crumble from 4/26 where paperwork was dropped off by pt.  On 4/27 - form was taken to MR by Danielle.  Under media tab - paperwork and release of information was scanned into Epic.  I printed this off and walked down to MR.  There I was informed they don't deal with this type of request but they do scan it and forward to [facility] to be completed.  MR then looked into their system and informed me the paperwork as requested with the last 2 office visit notes were faxed to [employer] today at 3:37 pm.    Spoke with patient who is aware paperwork was faxed today at 3:37 pm.  Pt will contact [place] to make sure they received it.  She will c/b with any questions or concerns.

## 2019-11-22 NOTE — Telephone Encounter (Signed)
Follow Up:     Pt said she called Medical Records about the paperwork that she left to be filled out on 11-15-19. She was told by them to call and talk to Dr Kingsley Plan nurse.

## 2019-12-02 DIAGNOSIS — B379 Candidiasis, unspecified: Secondary | ICD-10-CM | POA: Diagnosis not present

## 2019-12-02 DIAGNOSIS — Z01419 Encounter for gynecological examination (general) (routine) without abnormal findings: Secondary | ICD-10-CM | POA: Diagnosis not present

## 2019-12-13 ENCOUNTER — Ambulatory Visit: Payer: Medicare Other | Admitting: Cardiology

## 2019-12-14 DIAGNOSIS — E669 Obesity, unspecified: Secondary | ICD-10-CM | POA: Diagnosis not present

## 2019-12-14 DIAGNOSIS — M5136 Other intervertebral disc degeneration, lumbar region: Secondary | ICD-10-CM | POA: Diagnosis not present

## 2019-12-14 DIAGNOSIS — M810 Age-related osteoporosis without current pathological fracture: Secondary | ICD-10-CM | POA: Diagnosis not present

## 2019-12-14 DIAGNOSIS — L4059 Other psoriatic arthropathy: Secondary | ICD-10-CM | POA: Diagnosis not present

## 2019-12-14 DIAGNOSIS — Z6837 Body mass index (BMI) 37.0-37.9, adult: Secondary | ICD-10-CM | POA: Diagnosis not present

## 2019-12-14 DIAGNOSIS — E559 Vitamin D deficiency, unspecified: Secondary | ICD-10-CM | POA: Diagnosis not present

## 2019-12-14 DIAGNOSIS — M150 Primary generalized (osteo)arthritis: Secondary | ICD-10-CM | POA: Diagnosis not present

## 2019-12-16 ENCOUNTER — Encounter: Payer: Self-pay | Admitting: Cardiology

## 2019-12-16 ENCOUNTER — Other Ambulatory Visit: Payer: Self-pay

## 2019-12-16 ENCOUNTER — Ambulatory Visit (INDEPENDENT_AMBULATORY_CARE_PROVIDER_SITE_OTHER): Payer: Medicare Other | Admitting: Cardiology

## 2019-12-16 VITALS — BP 130/60 | HR 62 | Ht 60.0 in | Wt 192.4 lb

## 2019-12-16 DIAGNOSIS — M0600 Rheumatoid arthritis without rheumatoid factor, unspecified site: Secondary | ICD-10-CM

## 2019-12-16 DIAGNOSIS — I350 Nonrheumatic aortic (valve) stenosis: Secondary | ICD-10-CM | POA: Diagnosis not present

## 2019-12-16 DIAGNOSIS — I10 Essential (primary) hypertension: Secondary | ICD-10-CM

## 2019-12-16 NOTE — Progress Notes (Signed)
Cardiology Office Note:    Date:  12/16/2019   ID:  Lisa [name], DOB 05/01/1936, MRN [id number]  PCP:  Leighton Ruff, MD  Cardiologist:  Candee Furbish, MD  Electrophysiologist:  None   Referring MD: Leighton Ruff, MD     History of Present Illness:    Lisa [name] is a 84 y.o. female here for the follow-up of aortic stenosis.  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 This is currently the size of a thumbnail, discovered when she had an MRI to consider implant with Dr. Elwyn Reach. [place] was in 2012. Dr. Danne Baxter.   Very similar complaints when compared to prior visit, fatigue, headache. She wonders if it's potentially methotrexate. Headache left-sided at one point. [employer]. Water aerobics, has not been going as frequently as she used to. Trying to lose weight.   Brother had CABG 20 years ago.  Has aortic murmur which echocardiogram shows aortic sclerosis. Radiation to the carotids. Carotid Doppler was normal.   One new symptom she states is that she has had some chest pressure at times radiating across her chest wall nonexertional, lasting a few minutes duration, relieved with rest.  12/25/2017- note reviewed as above.She is not having any further chest discomfort. She still is having fatigue. Does not need thyroid medication at this point. Trying to exercise as best as possible. Using cane for stabilization given her back and knee pain. No shortness of breath, no syncope, no bleeding.  12/09/18 - aortic stenosis follow up. Temp normal. RA MD this morning. [employer] water exercises. On hold.  Overall she feels well, getting along just fine she states.  No chest pain no shortness of breath no fevers no chills.  12/16/19 -here for the follow-up of aortic stenosis.  Doing well.  No major symptoms.  See below for details.  Past Medical History:  Diagnosis Date  . Arthritis   . DDD  (degenerative disc disease), cervical   . Deafness in right ear   . Diverticulosis   . Hearing loss in left ear   . Heart murmur   . Kidney stones   . Psoriasis     Past Surgical History:  Procedure Laterality Date  . APPENDECTOMY    . BILATERAL SALPINGOOPHORECTOMY  1996  . CATARACT EXTRACTION    . CHOLECYSTECTOMY  2002  . COLONOSCOPY  2003/2009/2015  . TOTAL ABDOMINAL HYSTERECTOMY  1979   FIBROIDS     Current Medications: Current Meds  Medication Sig  . acetaminophen (TYLENOL) 325 MG tablet Take 650 mg by mouth every 6 (six) hours as needed for mild pain or moderate pain. 3/4 tabs daily if needed  . amLODipine (NORVASC) 5 MG tablet Take 5 mg by mouth daily.   [name] Kitchen aspirin 81 MG tablet Take 81 mg by mouth every other day.   . Calcium Carbonate-Vit D-Min (CALTRATE PLUS PO) Take 1 tablet by mouth 2 (two) times daily.   . Cholecalciferol (VITAMIN D) 2000 units tablet Take 2,000 Units by mouth daily.  . Cyanocobalamin (B-12) 1000 MCG CAPS Take by mouth daily.  . fluticasone (FLONASE) 50 MCG/ACT nasal spray Place 1 spray into both nostrils daily.   . folic acid (FOLVITE) 1 MG tablet Take 1 mg by mouth daily.   [name] Kitchen losartan-hydrochlorothiazide (HYZAAR) 100-25 MG tablet TAKE 1 TABLET BY MOUTH EVERY DAY  . meloxicam (MOBIC) 15 MG tablet Take 15 mg by mouth  daily.  . methotrexate (RHEUMATREX) 2.5 MG tablet Take 10 mg by mouth 2 (two) times a week.   . Multiple Vitamins-Minerals (PRESERVISION AREDS) TABS Take 1 tablet by mouth daily.  [name] Kitchen nystatin cream (MYCOSTATIN) APPLY TO AFFECTED AREA AS NEEDED FOR 7 DAYS  . sodium chloride (OCEAN) 0.65 % SOLN nasal spray Place 1 spray into both nostrils as needed for congestion.     Allergies:   Lisinopril and Penicillins   Social History   Socioeconomic History  . Marital status: Single    Spouse name: Not on file  . Number of children: Not on file  . Years of education: Not on file  . Highest education level: Not on file  Occupational History    . Not on file  Tobacco Use  . Smoking status: Former Research scientist (life sciences)  . Smokeless tobacco: Never Used  Substance and Sexual Activity  . Alcohol use: Yes    Comment: WINE  . Drug use: No  . Sexual activity: Not on file  Other Topics Concern  . Not on file  Social History Narrative  . Not on file   Social Determinants of Health   Financial Resource Strain:   . Difficulty of Paying Living Expenses:   Food Insecurity:   . Worried About Charity fundraiser in the Last Year:   . Arboriculturist in the Last Year:   Transportation Needs:   . Film/video editor (Medical):   [name] Kitchen Lack of Transportation (Non-Medical):   Physical Activity:   . Days of Exercise per Week:   . Minutes of Exercise per Session:   Stress:   . Feeling of Stress :   Social Connections:   . Frequency of Communication with Friends and Family:   . Frequency of Social Gatherings with Friends and Family:   . Attends Religious Services:   . Active Member of Clubs or Organizations:   . Attends Archivist Meetings:   [name] Kitchen Marital Status:      Family History: The patient's family history includes Goiter in her brother and mother; Heart disease in her brother, brother, father, mother, and paternal grandfather.  ROS:   Please see the history of present illness.     All other systems reviewed and are negative.  EKGs/Labs/Other Studies Reviewed:    The following studies were reviewed today: See below  EKG:  EKG is  ordered today.  The ekg ordered today demonstrates sinus rhythm 62 with poor R wave progression  Recent Labs: No results found for requested labs within last 8760 hours.  Recent Lipid Panel No results found for: CHOL, TRIG, HDL, CHOLHDL, VLDL, LDLCALC, LDLDIRECT  Physical Exam:    VS:  BP 130/60   Pulse 62   Ht 5' (1.524 m)   Wt 192 lb 6.4 oz (87.3 kg)   LMP  (LMP Unknown)   SpO2 97%   BMI 37.58 kg/m     Wt Readings from Last 3 Encounters:  12/16/19 192 lb 6.4 oz (87.3 kg)  12/09/18  197 lb (89.4 kg)  12/25/17 201 lb 12.8 oz (91.5 kg)     GEN:  Well nourished, well developed in no acute distress HEENT: Normal NECK: No JVD; No carotid bruits LYMPHATICS: No lymphadenopathy CARDIAC: RRR, systolic murmur, rubs, gallops RESPIRATORY:  Clear to auscultation without rales, wheezing or rhonchi  ABDOMEN: Soft, non-tender, non-distended MUSCULOSKELETAL:  No edema; No deformity  SKIN: Warm and dry NEUROLOGIC:  Alert and oriented x 3 PSYCHIATRIC:  Normal affect  ASSESSMENT:    1. Nonrheumatic aortic valve stenosis   2. Rheumatoid arthritis with negative rheumatoid factor, involving unspecified site ([place])   3. Essential hypertension, benign    PLAN:    In order of problems listed above:  Aortic stenosis -Previously mild.  We will recheck echocardiogram since it has been 2 years.  1 year follow-up.  Essential hypertension -Medications reviewed as above.  Continue.  Overall reasonable control.  Emily history of coronary artery disease -Brother father 3 nephews.  Continue to monitor.  Aggressive secondary prevention.  Rheumatoid arthritis -Occasionally will take Celebrex.  This is helpful for her pain.  She does realize that there is a mild increase in myocardial infarction with the medication however benefits from an anti-inflammatory perspective are important for her.    Medication Adjustments/Labs and Tests Ordered: Current medicines are reviewed at length with the patient today.  Concerns regarding medicines are outlined above.  Orders Placed This Encounter  Procedures  . EKG 12-Lead  . ECHOCARDIOGRAM COMPLETE   No orders of the defined types were placed in this encounter.   Patient Instructions  Medication Instructions:  The current medical regimen is effective;  continue present plan and medications.  *If you need a refill on your cardiac medications before your next appointment, please call your pharmacy*  Testing/Procedures: Your physician has  requested that you have an echocardiogram. Echocardiography is a painless test that uses sound waves to create images of your heart. It provides your doctor with information about the size and shape of your heart and how well your heart's chambers and valves are working. This procedure takes approximately one hour. There are no restrictions for this procedure.  Follow-Up: At [facility], you and your health needs are our priority.  As part of our continuing mission to provide you with exceptional heart care, we have created designated Provider Care Teams.  These Care Teams include your primary Cardiologist (physician) and Advanced Practice Providers (APPs -  Physician Assistants and Nurse Practitioners) who all work together to provide you with the care you need, when you need it.  We recommend signing up for the patient portal called "MyChart".  Sign up information is provided on this After Visit Summary.  MyChart is used to connect with patients for Virtual Visits (Telemedicine).  Patients are able to view lab/test results, encounter notes, upcoming appointments, etc.  Non-urgent messages can be sent to your provider as well.   To learn more about what you can do with MyChart, go to [email or web address].    Your next appointment:   12 month(s)  The format for your next appointment:   In Person  Provider:   Candee Furbish, MD   Thank you for choosing [facility]!!        Signed, Candee Furbish, MD  12/16/2019 5:26 PM    [place]

## 2019-12-16 NOTE — Patient Instructions (Signed)
Medication Instructions:  The current medical regimen is effective;  continue present plan and medications.  *If you need a refill on your cardiac medications before your next appointment, please call your pharmacy*  Testing/Procedures: Your physician has requested that you have an echocardiogram. Echocardiography is a painless test that uses sound waves to create images of your heart. It provides your doctor with information about the size and shape of your heart and how well your heart's chambers and valves are working. This procedure takes approximately one hour. There are no restrictions for this procedure.  Follow-Up: At [facility], you and your health needs are our priority.  As part of our continuing mission to provide you with exceptional heart care, we have created designated Provider Care Teams.  These Care Teams include your primary Cardiologist (physician) and Advanced Practice Providers (APPs -  Physician Assistants and Nurse Practitioners) who all work together to provide you with the care you need, when you need it.  We recommend signing up for the patient portal called "MyChart".  Sign up information is provided on this After Visit Summary.  MyChart is used to connect with patients for Virtual Visits (Telemedicine).  Patients are able to view lab/test results, encounter notes, upcoming appointments, etc.  Non-urgent messages can be sent to your provider as well.   To learn more about what you can do with MyChart, go to [email or web address].    Your next appointment:   12 month(s)  The format for your next appointment:   In Person  Provider:   Mark Skains, MD   Thank you for choosing [facility]!!      

## 2019-12-18 ENCOUNTER — Other Ambulatory Visit: Payer: Self-pay | Admitting: Cardiology

## 2020-01-11 ENCOUNTER — Other Ambulatory Visit: Payer: Self-pay

## 2020-01-11 ENCOUNTER — Ambulatory Visit (HOSPITAL_COMMUNITY): Payer: Medicare Other | Attending: Cardiology

## 2020-01-11 DIAGNOSIS — I350 Nonrheumatic aortic (valve) stenosis: Secondary | ICD-10-CM | POA: Insufficient documentation

## 2020-01-12 ENCOUNTER — Telehealth: Payer: Self-pay | Admitting: Cardiology

## 2020-01-12 NOTE — Telephone Encounter (Signed)
Patient returning call for echo results. 

## 2020-01-12 NOTE — Telephone Encounter (Signed)
Pt aware of echo results ./cy 

## 2020-01-12 NOTE — Telephone Encounter (Signed)
Jerline Pain, MD  P [facility]v [facility] Triage Echo reassuring. Mild aortic stenosis.

## 2020-03-15 DIAGNOSIS — L4059 Other psoriatic arthropathy: Secondary | ICD-10-CM | POA: Diagnosis not present

## 2020-03-15 DIAGNOSIS — M150 Primary generalized (osteo)arthritis: Secondary | ICD-10-CM | POA: Diagnosis not present

## 2020-03-15 DIAGNOSIS — M5136 Other intervertebral disc degeneration, lumbar region: Secondary | ICD-10-CM | POA: Diagnosis not present

## 2020-03-15 DIAGNOSIS — Z6838 Body mass index (BMI) 38.0-38.9, adult: Secondary | ICD-10-CM | POA: Diagnosis not present

## 2020-03-15 DIAGNOSIS — E669 Obesity, unspecified: Secondary | ICD-10-CM | POA: Diagnosis not present

## 2020-03-15 DIAGNOSIS — M810 Age-related osteoporosis without current pathological fracture: Secondary | ICD-10-CM | POA: Diagnosis not present

## 2020-03-20 DIAGNOSIS — R350 Frequency of micturition: Secondary | ICD-10-CM | POA: Diagnosis not present

## 2020-03-20 DIAGNOSIS — J302 Other seasonal allergic rhinitis: Secondary | ICD-10-CM | POA: Diagnosis not present

## 2020-03-30 ENCOUNTER — Encounter (INDEPENDENT_AMBULATORY_CARE_PROVIDER_SITE_OTHER): Payer: Medicare Other | Admitting: Ophthalmology

## 2020-04-03 ENCOUNTER — Encounter (INDEPENDENT_AMBULATORY_CARE_PROVIDER_SITE_OTHER): Payer: Medicare Other | Admitting: Ophthalmology

## 2020-04-03 ENCOUNTER — Other Ambulatory Visit: Payer: Self-pay

## 2020-04-03 ENCOUNTER — Ambulatory Visit (INDEPENDENT_AMBULATORY_CARE_PROVIDER_SITE_OTHER): Payer: Medicare Other | Admitting: Ophthalmology

## 2020-04-03 ENCOUNTER — Encounter (INDEPENDENT_AMBULATORY_CARE_PROVIDER_SITE_OTHER): Payer: Self-pay | Admitting: Ophthalmology

## 2020-04-03 DIAGNOSIS — H43812 Vitreous degeneration, left eye: Secondary | ICD-10-CM

## 2020-04-03 DIAGNOSIS — H43811 Vitreous degeneration, right eye: Secondary | ICD-10-CM | POA: Diagnosis not present

## 2020-04-03 DIAGNOSIS — H353132 Nonexudative age-related macular degeneration, bilateral, intermediate dry stage: Secondary | ICD-10-CM | POA: Insufficient documentation

## 2020-04-03 NOTE — Progress Notes (Signed)
04/03/2020     CHIEF COMPLAINT Patient presents for Retina Follow Up   HISTORY OF PRESENT ILLNESS: Lisa [name] is a 84 y.o. female who presents to the clinic today for:   HPI    Retina Follow Up    Patient presents with  Dry AMD.  In both eyes.  This started 1 year ago.  Severity is mild.  Duration of 1 year.  Since onset it is gradually worsening.          Comments    1 Year AMD F/U OU  Pt c/o gradual clouding OU, OD>OS. Pt c/o photophobia OU. Pt c/o "mucous" coming out of eyes x 3 weeks OU.       Last edited by Rockie Neighbours, [place] on 04/03/2020  1:08 PM. (History)      Referring physician: Leighton Ruff, MD [place],  [place] [id number]  HISTORICAL INFORMATION:   Selected notes from the MEDICAL RECORD NUMBER       CURRENT MEDICATIONS: No current outpatient medications on file. (Ophthalmic Drugs)   No current facility-administered medications for this visit. (Ophthalmic Drugs)   Current Outpatient Medications (Other)  Medication Sig  . acetaminophen (TYLENOL) 325 MG tablet Take 650 mg by mouth every 6 (six) hours as needed for mild pain or moderate pain. 3/4 tabs daily if needed  . amLODipine (NORVASC) 5 MG tablet Take 5 mg by mouth daily.   [name] Kitchen aspirin 81 MG tablet Take 81 mg by mouth every other day.   . Calcium Carbonate-Vit D-Min (CALTRATE PLUS PO) Take 1 tablet by mouth 2 (two) times daily.   . Cholecalciferol (VITAMIN D) 2000 units tablet Take 2,000 Units by mouth daily.  . Cyanocobalamin (B-12) 1000 MCG CAPS Take by mouth daily.  . fluticasone (FLONASE) 50 MCG/ACT nasal spray Place 1 spray into both nostrils daily.   . folic acid (FOLVITE) 1 MG tablet Take 1 mg by mouth daily.   [name] Kitchen losartan-hydrochlorothiazide (HYZAAR) 100-25 MG tablet TAKE 1 TABLET BY MOUTH EVERY DAY  . meloxicam (MOBIC) 15 MG tablet Take 15 mg by mouth daily.  . methotrexate (RHEUMATREX) 2.5 MG tablet Take 10 mg by mouth 2 (two) times a week.   . Multiple  Vitamins-Minerals (PRESERVISION AREDS) TABS Take 1 tablet by mouth daily.  [name] Kitchen nystatin cream (MYCOSTATIN) APPLY TO AFFECTED AREA AS NEEDED FOR 7 DAYS  . sodium chloride (OCEAN) 0.65 % SOLN nasal spray Place 1 spray into both nostrils as needed for congestion.   No current facility-administered medications for this visit. (Other)      REVIEW OF SYSTEMS:    ALLERGIES Allergies  Allergen Reactions  . Doxycycline   . Lisinopril     Cough   . Penicillins     whelp    PAST MEDICAL HISTORY Past Medical History:  Diagnosis Date  . Arthritis   . DDD (degenerative disc disease), cervical   . Deafness in right ear   . Diverticulosis   . Hearing loss in left ear   . Heart murmur   . Kidney stones   . Psoriasis    Past Surgical History:  Procedure Laterality Date  . APPENDECTOMY    . BILATERAL SALPINGOOPHORECTOMY  1996  . CATARACT EXTRACTION    . CHOLECYSTECTOMY  2002  . COLONOSCOPY  2003/2009/2015  . TOTAL ABDOMINAL HYSTERECTOMY  1979   FIBROIDS     FAMILY HISTORY Family History  Problem Relation Age of Onset  . Heart disease Mother   .  Goiter Mother   . Heart disease Father   . Heart disease Brother   . Goiter Brother   . Heart disease Paternal Grandfather   . Heart disease Brother     SOCIAL HISTORY Social History   Tobacco Use  . Smoking status: Former Research scientist (life sciences)  . Smokeless tobacco: Never Used  Vaping Use  . Vaping Use: Never used  Substance Use Topics  . Alcohol use: Yes    Comment: WINE  . Drug use: No         OPHTHALMIC EXAM:  Base Eye Exam    Visual Acuity (ETDRS)      Right Left   Dist cc 20/25 -2 20/20 -1   Correction: Glasses       Tonometry (Tonopen, 1:10 PM)      Right Left   Pressure 22 16       Pupils      Pupils Dark Light Shape React APD   Right PERRL 3 2 Round Brisk None   Left PERRL 3 2 Round Brisk None       Visual Fields (Counting fingers)      Left Right    Full Full       Extraocular Movement      Right Left      Full Full       Neuro/Psych    Oriented x3: Yes   Mood/Affect: Normal       Dilation    Both eyes: 1.0% Mydriacyl, 2.5% Phenylephrine @ 1:12 PM        Slit Lamp and Fundus Exam    External Exam      Right Left   External Normal Normal       Slit Lamp Exam      Right Left   Lids/Lashes Normal Normal   Conjunctiva/Sclera White and quiet White and quiet   Cornea Clear Clear   Anterior Chamber Deep and quiet Deep and quiet   Iris Round and reactive Round and reactive   Lens Posterior chamber intraocular lens Posterior chamber intraocular lens   Anterior Vitreous Normal Normal       Fundus Exam      Right Left   Posterior Vitreous Posterior vitreous detachment Posterior vitreous detachment   Disc Normal Normal   C/D Ratio 0.35 0.35   Macula Intermediate age related macular degeneration, Pigmented atrophy, no exudates, no macular thickening Intermediate age related macular degeneration, Pigmented atrophy, no exudates, no macular thickening   Vessels Normal Normal   Periphery Normal Normal          IMAGING AND PROCEDURES  Imaging and Procedures for 04/03/20  OCT, Retina - OU - Both Eyes       Right Eye Quality was good. Scan locations included subfoveal. Central Foveal Thickness: 243. Findings include abnormal foveal contour, retinal drusen , outer retinal atrophy.   Left Eye Quality was good. Scan locations included subfoveal. Central Foveal Thickness: 265. Progression has been stable. Findings include abnormal foveal contour, retinal drusen , outer retinal atrophy.   Notes No signs of active CNVM OU, will observe                ASSESSMENT/PLAN:  Intermediate stage nonexudative age-related macular degeneration of both eyes The nature of age--related macular degeneration was discussed with the patient as well as the distinction between dry and wet types. Checking an Amsler Grid daily with advice to return immediately should a distortion develop, was  given to the patient. The patient '  s smoking status now and in the past was determined and advice based on the AREDS study was provided regarding the consumption of antioxidant supplements. AREDS 2 vitamin formulation was recommended. Consumption of dark leafy vegetables and fresh fruits of various colors was recommended. Treatment modalities for wet macular degeneration particularly the use of intravitreal injections of anti-blood vessel growth factors was discussed with the patient. Avastin, Lucentis, and Eylea are the available options. On occasion, therapy includes the use of photodynamic therapy and thermal laser. Stressed to the patient do not rub eyes.  Patient was advised to check Amsler Grid daily and return immediately if changes are noted. Instructions on using the grid were given to the patient. All patient questions were answered.  OU no signs of active CNVM will observe      ICD-10-CM   1. Intermediate stage nonexudative age-related macular degeneration of both eyes  H35.3132 OCT, Retina - OU - Both Eyes  2. Posterior vitreous detachment of right eye  H43.811 OCT, Retina - OU - Both Eyes  3. Posterior vitreous detachment of left eye  H43.812     1.  Patient to report any new onset visual acuity distortions or declines in either eye  2.  3.  Ophthalmic Meds Ordered this visit:  No orders of the defined types were placed in this encounter.      Return in about 1 year (around 04/03/2021) for DILATE OU, OCT.  Patient Instructions  Age-Related Macular Degeneration  Age-related macular degeneration (AMD) is an eye disease related to aging. The disease causes a loss of central vision. Central vision allows a person to see objects clearly and do daily tasks like reading and driving. There are two main types of AMD:  Dry AMD. People with this type generally lose their vision slowly. This is the most common type of AMD. Some people with dry AMD notice very little change in their  vision as they age.  Wet AMD. People with this type can lose their vision quickly. What are the causes? This condition is caused by damage to the part of the eye that provides you with central vision (macula).  Dry AMD happens when deposits in the macula cause light-sensitive cells to slowly break down.  Wet AMD happens when abnormal blood vessels grow under the macula and leak blood and fluid. What increases the risk? You are more likely to develop this condition if you:  Are 71 years old or older, and especially 43 years old or older.  Smoke.  Are obese.  Have a family history of AMD.  Have high cholesterol, high blood pressure, or heart disease.  Have been exposed to high levels of ultraviolet (UV) light and blue light.  Are white (Caucasian).  Are female. What are the signs or symptoms? Common symptoms of this condition include:  Blurred vision, especially when reading print material. The blurred vision often improves in brighter light.  A blurred or blind spot in the center of your field of vision that is small but growing larger.  Bright colors seeming less bright than they used to be.  Decreased ability to recognize and see faces.  One eye seeing worse than the other.  Decreased ability to adapt to dimly lit rooms.  Straight lines appearing crooked or wavy. How is this diagnosed? This condition is diagnosed based on your symptoms and an eye exam. During the eye exam:  Eye drops will be placed into your eyes to enlarge (dilate) your pupils. This will allow  your health care provider to see the back of your eye.  You may be asked to look at an image that looks like a checkerboard (Amsler grid). Early changes in your central vision may cause the grid to appear distorted. After the exam, you may be given one or both of these tests:  Fluorescein angiogram. This test determines whether you have dry or wet AMD.  Optical coherence tomography (OCT) test to evaluate  deep layers of the retina. How is this treated? There is no cure for this condition, but treatment can help to slow down progression of the disease. This condition may be treated with:  Supplements, including vitamin C, vitamin E, beta carotene, and zinc.  Laser surgery to destroy new blood vessels or leaking blood vessels in your eye.  Injections of medicines into your eye to slow down the formation of abnormal blood vessels that may leak. These injections may need to be repeated on a routine basis. Follow these instructions at home:  Take over-the-counter and prescription medicines only as told by your health care provider.  Take vitamins and supplements as told by your health care provider.  Ask your health care provider for an Amsler grid. Use it every day to check each eye for vision changes.  Get an eye exam as often as told by your health care provider. Make sure to get an eye exam at least once every year.  Keep all follow-up visits as told by your health care provider. This is important. Contact a health care provider if:  You notice any new changes in your vision. Get help right away if:  You suddenly lose vision or develop pain in the eye. Summary  Age-related macular degeneration (AMD) is an eye disease related to aging. There are two types of this condition: dry AMD and wet AMD.  This condition is caused by damage to the part of the eye that provides you with central vision (macula).  Once diagnosed with AMD, make sure to get an eye exam every year, take supplements and vitamins as directed, use an Amsler grid at home, and follow up with your health care provider. This information is not intended to replace advice given to you by your health care provider. Make sure you discuss any questions you have with your health care provider. Document Revised: 01/14/2018 Document Reviewed: 01/14/2018 Elsevier Patient Education  [place] the  diagnoses, plan, and follow up with the patient and they expressed understanding.  Patient expressed understanding of the importance of proper follow up care.   Clent Demark Laykin Rainone M.D. Diseases & Surgery of the Retina and Vitreous [facility] [place] 04/03/20     Abbreviations: M myopia (nearsighted); A astigmatism; H hyperopia (farsighted); P presbyopia; Mrx spectacle prescription;  CTL contact lenses; OD right eye; OS left eye; OU both eyes  XT exotropia; ET esotropia; PEK punctate epithelial keratitis; PEE punctate epithelial erosions; DES dry eye syndrome; MGD meibomian gland dysfunction; ATs artificial tears; PFAT's preservative free artificial tears; [place] nuclear sclerotic cataract; PSC posterior subcapsular cataract; ERM epi-retinal membrane; PVD posterior vitreous detachment; RD retinal detachment; DM diabetes mellitus; DR diabetic retinopathy; NPDR non-proliferative diabetic retinopathy; PDR proliferative diabetic retinopathy; CSME clinically significant macular edema; DME diabetic macular edema; dbh dot blot hemorrhages; CWS cotton wool spot; POAG primary open angle glaucoma; C/D cup-to-disc ratio; HVF humphrey visual field; GVF goldmann visual field; OCT optical coherence tomography; IOP intraocular pressure; BRVO Branch retinal vein occlusion; CRVO central  retinal vein occlusion; CRAO central retinal artery occlusion; BRAO branch retinal artery occlusion; RT retinal tear; SB scleral buckle; PPV pars plana vitrectomy; VH Vitreous hemorrhage; PRP panretinal laser photocoagulation; IVK intravitreal kenalog; VMT vitreomacular traction; MH Macular hole;  NVD neovascularization of the disc; NVE neovascularization elsewhere; AREDS age related eye disease study; ARMD age related macular degeneration; POAG primary open angle glaucoma; EBMD epithelial/anterior basement membrane dystrophy; ACIOL anterior chamber intraocular lens; IOL intraocular lens; PCIOL posterior chamber intraocular lens;  Phaco/IOL phacoemulsification with intraocular lens placement; [place] photorefractive keratectomy; LASIK laser assisted in situ keratomileusis; HTN hypertension; DM diabetes mellitus; COPD chronic obstructive pulmonary disease

## 2020-04-03 NOTE — Assessment & Plan Note (Signed)
The nature of age--related macular degeneration was discussed with the patient as well as the distinction between dry and wet types. Checking an Amsler Grid daily with advice to return immediately should a distortion develop, was given to the patient. The patient 's smoking status now and in the past was determined and advice based on the AREDS study was provided regarding the consumption of antioxidant supplements. AREDS 2 vitamin formulation was recommended. Consumption of dark leafy vegetables and fresh fruits of various colors was recommended. Treatment modalities for wet macular degeneration particularly the use of intravitreal injections of anti-blood vessel growth factors was discussed with the patient. Avastin, Lucentis, and Eylea are the available options. On occasion, therapy includes the use of photodynamic therapy and thermal laser. Stressed to the patient do not rub eyes.  Patient was advised to check Amsler Grid daily and return immediately if changes are noted. Instructions on using the grid were given to the patient. All patient questions were answered.  OU no signs of active CNVM will observe

## 2020-04-03 NOTE — Patient Instructions (Signed)
Age-Related Macular Degeneration  Age-related macular degeneration (AMD) is an eye disease related to aging. The disease causes a loss of central vision. Central vision allows a person to see objects clearly and do daily tasks like reading and driving. There are two main types of AMD:  Dry AMD. People with this type generally lose their vision slowly. This is the most common type of AMD. Some people with dry AMD notice very little change in their vision as they age.  Wet AMD. People with this type can lose their vision quickly. What are the causes? This condition is caused by damage to the part of the eye that provides you with central vision (macula).  Dry AMD happens when deposits in the macula cause light-sensitive cells to slowly break down.  Wet AMD happens when abnormal blood vessels grow under the macula and leak blood and fluid. What increases the risk? You are more likely to develop this condition if you:  Are 50 years old or older, and especially 75 years old or older.  Smoke.  Are obese.  Have a family history of AMD.  Have high cholesterol, high blood pressure, or heart disease.  Have been exposed to high levels of ultraviolet (UV) light and blue light.  Are white (Caucasian).  Are female. What are the signs or symptoms? Common symptoms of this condition include:  Blurred vision, especially when reading print material. The blurred vision often improves in brighter light.  A blurred or blind spot in the center of your field of vision that is small but growing larger.  Bright colors seeming less bright than they used to be.  Decreased ability to recognize and see faces.  One eye seeing worse than the other.  Decreased ability to adapt to dimly lit rooms.  Straight lines appearing crooked or wavy. How is this diagnosed? This condition is diagnosed based on your symptoms and an eye exam. During the eye exam:  Eye drops will be placed into your eyes to  enlarge (dilate) your pupils. This will allow your health care provider to see the back of your eye.  You may be asked to look at an image that looks like a checkerboard (Amsler grid). Early changes in your central vision may cause the grid to appear distorted. After the exam, you may be given one or both of these tests:  Fluorescein angiogram. This test determines whether you have dry or wet AMD.  Optical coherence tomography (OCT) test to evaluate deep layers of the retina. How is this treated? There is no cure for this condition, but treatment can help to slow down progression of the disease. This condition may be treated with:  Supplements, including vitamin C, vitamin E, beta carotene, and zinc.  Laser surgery to destroy new blood vessels or leaking blood vessels in your eye.  Injections of medicines into your eye to slow down the formation of abnormal blood vessels that may leak. These injections may need to be repeated on a routine basis. Follow these instructions at home:  Take over-the-counter and prescription medicines only as told by your health care provider.  Take vitamins and supplements as told by your health care provider.  Ask your health care provider for an Amsler grid. Use it every day to check each eye for vision changes.  Get an eye exam as often as told by your health care provider. Make sure to get an eye exam at least once every year.  Keep all follow-up visits as told by   your health care provider. This is important. Contact a health care provider if:  You notice any new changes in your vision. Get help right away if:  You suddenly lose vision or develop pain in the eye. Summary  Age-related macular degeneration (AMD) is an eye disease related to aging. There are two types of this condition: dry AMD and wet AMD.  This condition is caused by damage to the part of the eye that provides you with central vision (macula).  Once diagnosed with AMD, make sure  to get an eye exam every year, take supplements and vitamins as directed, use an Amsler grid at home, and follow up with your health care provider. This information is not intended to replace advice given to you by your health care provider. Make sure you discuss any questions you have with your health care provider. Document Revised: 01/14/2018 Document Reviewed: 01/14/2018 Elsevier Patient Education  2020 [employer]  

## 2020-05-01 DIAGNOSIS — L299 Pruritus, unspecified: Secondary | ICD-10-CM | POA: Diagnosis not present

## 2020-05-03 DIAGNOSIS — Z23 Encounter for immunization: Secondary | ICD-10-CM | POA: Diagnosis not present

## 2020-05-08 DIAGNOSIS — Z6835 Body mass index (BMI) 35.0-35.9, adult: Secondary | ICD-10-CM | POA: Diagnosis not present

## 2020-05-08 DIAGNOSIS — M5442 Lumbago with sciatica, left side: Secondary | ICD-10-CM | POA: Diagnosis not present

## 2020-05-08 DIAGNOSIS — G8929 Other chronic pain: Secondary | ICD-10-CM | POA: Diagnosis not present

## 2020-05-08 DIAGNOSIS — M4120 Other idiopathic scoliosis, site unspecified: Secondary | ICD-10-CM | POA: Diagnosis not present

## 2020-05-08 DIAGNOSIS — M5441 Lumbago with sciatica, right side: Secondary | ICD-10-CM | POA: Insufficient documentation

## 2020-05-08 DIAGNOSIS — M4316 Spondylolisthesis, lumbar region: Secondary | ICD-10-CM | POA: Insufficient documentation

## 2020-05-08 DIAGNOSIS — I10 Essential (primary) hypertension: Secondary | ICD-10-CM | POA: Diagnosis not present

## 2020-05-08 DIAGNOSIS — M5416 Radiculopathy, lumbar region: Secondary | ICD-10-CM | POA: Diagnosis not present

## 2020-05-19 DIAGNOSIS — H524 Presbyopia: Secondary | ICD-10-CM | POA: Diagnosis not present

## 2020-05-19 DIAGNOSIS — Z961 Presence of intraocular lens: Secondary | ICD-10-CM | POA: Diagnosis not present

## 2020-05-19 DIAGNOSIS — E119 Type 2 diabetes mellitus without complications: Secondary | ICD-10-CM | POA: Diagnosis not present

## 2020-05-19 DIAGNOSIS — H5213 Myopia, bilateral: Secondary | ICD-10-CM | POA: Diagnosis not present

## 2020-05-19 DIAGNOSIS — H52223 Regular astigmatism, bilateral: Secondary | ICD-10-CM | POA: Diagnosis not present

## 2020-05-23 DIAGNOSIS — R262 Difficulty in walking, not elsewhere classified: Secondary | ICD-10-CM | POA: Diagnosis not present

## 2020-05-23 DIAGNOSIS — M6281 Muscle weakness (generalized): Secondary | ICD-10-CM | POA: Diagnosis not present

## 2020-05-23 DIAGNOSIS — M47816 Spondylosis without myelopathy or radiculopathy, lumbar region: Secondary | ICD-10-CM | POA: Diagnosis not present

## 2020-05-24 DIAGNOSIS — Z23 Encounter for immunization: Secondary | ICD-10-CM | POA: Diagnosis not present

## 2020-05-25 DIAGNOSIS — R262 Difficulty in walking, not elsewhere classified: Secondary | ICD-10-CM | POA: Diagnosis not present

## 2020-05-25 DIAGNOSIS — M47816 Spondylosis without myelopathy or radiculopathy, lumbar region: Secondary | ICD-10-CM | POA: Diagnosis not present

## 2020-05-25 DIAGNOSIS — M6281 Muscle weakness (generalized): Secondary | ICD-10-CM | POA: Diagnosis not present

## 2020-05-31 DIAGNOSIS — R262 Difficulty in walking, not elsewhere classified: Secondary | ICD-10-CM | POA: Diagnosis not present

## 2020-05-31 DIAGNOSIS — M6281 Muscle weakness (generalized): Secondary | ICD-10-CM | POA: Diagnosis not present

## 2020-05-31 DIAGNOSIS — M47816 Spondylosis without myelopathy or radiculopathy, lumbar region: Secondary | ICD-10-CM | POA: Diagnosis not present

## 2020-06-07 DIAGNOSIS — M47816 Spondylosis without myelopathy or radiculopathy, lumbar region: Secondary | ICD-10-CM | POA: Diagnosis not present

## 2020-06-07 DIAGNOSIS — R262 Difficulty in walking, not elsewhere classified: Secondary | ICD-10-CM | POA: Diagnosis not present

## 2020-06-07 DIAGNOSIS — M6281 Muscle weakness (generalized): Secondary | ICD-10-CM | POA: Diagnosis not present

## 2020-06-09 DIAGNOSIS — M6281 Muscle weakness (generalized): Secondary | ICD-10-CM | POA: Diagnosis not present

## 2020-06-09 DIAGNOSIS — M47816 Spondylosis without myelopathy or radiculopathy, lumbar region: Secondary | ICD-10-CM | POA: Diagnosis not present

## 2020-06-09 DIAGNOSIS — R262 Difficulty in walking, not elsewhere classified: Secondary | ICD-10-CM | POA: Diagnosis not present

## 2020-06-12 DIAGNOSIS — M6281 Muscle weakness (generalized): Secondary | ICD-10-CM | POA: Diagnosis not present

## 2020-06-12 DIAGNOSIS — R262 Difficulty in walking, not elsewhere classified: Secondary | ICD-10-CM | POA: Diagnosis not present

## 2020-06-12 DIAGNOSIS — M47816 Spondylosis without myelopathy or radiculopathy, lumbar region: Secondary | ICD-10-CM | POA: Diagnosis not present

## 2020-06-13 DIAGNOSIS — Z79899 Other long term (current) drug therapy: Secondary | ICD-10-CM | POA: Diagnosis not present

## 2020-06-13 DIAGNOSIS — M150 Primary generalized (osteo)arthritis: Secondary | ICD-10-CM | POA: Diagnosis not present

## 2020-06-14 DIAGNOSIS — M47816 Spondylosis without myelopathy or radiculopathy, lumbar region: Secondary | ICD-10-CM | POA: Diagnosis not present

## 2020-06-14 DIAGNOSIS — M6281 Muscle weakness (generalized): Secondary | ICD-10-CM | POA: Diagnosis not present

## 2020-06-14 DIAGNOSIS — R262 Difficulty in walking, not elsewhere classified: Secondary | ICD-10-CM | POA: Diagnosis not present

## 2020-06-19 DIAGNOSIS — R262 Difficulty in walking, not elsewhere classified: Secondary | ICD-10-CM | POA: Diagnosis not present

## 2020-06-19 DIAGNOSIS — M6281 Muscle weakness (generalized): Secondary | ICD-10-CM | POA: Diagnosis not present

## 2020-06-19 DIAGNOSIS — M47816 Spondylosis without myelopathy or radiculopathy, lumbar region: Secondary | ICD-10-CM | POA: Diagnosis not present

## 2020-06-21 DIAGNOSIS — M47816 Spondylosis without myelopathy or radiculopathy, lumbar region: Secondary | ICD-10-CM | POA: Diagnosis not present

## 2020-06-21 DIAGNOSIS — M6281 Muscle weakness (generalized): Secondary | ICD-10-CM | POA: Diagnosis not present

## 2020-06-21 DIAGNOSIS — R262 Difficulty in walking, not elsewhere classified: Secondary | ICD-10-CM | POA: Diagnosis not present

## 2020-06-22 DIAGNOSIS — R350 Frequency of micturition: Secondary | ICD-10-CM | POA: Diagnosis not present

## 2020-06-30 DIAGNOSIS — M25521 Pain in right elbow: Secondary | ICD-10-CM | POA: Diagnosis not present

## 2020-07-05 DIAGNOSIS — R7309 Other abnormal glucose: Secondary | ICD-10-CM | POA: Diagnosis not present

## 2020-07-05 DIAGNOSIS — E0520 Thyrotoxicosis with toxic multinodular goiter without thyrotoxic crisis or storm: Secondary | ICD-10-CM | POA: Diagnosis not present

## 2020-07-06 DIAGNOSIS — H903 Sensorineural hearing loss, bilateral: Secondary | ICD-10-CM | POA: Diagnosis not present

## 2020-07-06 DIAGNOSIS — H90A22 Sensorineural hearing loss, unilateral, left ear, with restricted hearing on the contralateral side: Secondary | ICD-10-CM | POA: Diagnosis not present

## 2020-07-26 DIAGNOSIS — R5382 Chronic fatigue, unspecified: Secondary | ICD-10-CM | POA: Diagnosis not present

## 2020-08-21 DIAGNOSIS — L4050 Arthropathic psoriasis, unspecified: Secondary | ICD-10-CM | POA: Insufficient documentation

## 2020-08-21 DIAGNOSIS — R0989 Other specified symptoms and signs involving the circulatory and respiratory systems: Secondary | ICD-10-CM | POA: Diagnosis not present

## 2020-08-21 DIAGNOSIS — K219 Gastro-esophageal reflux disease without esophagitis: Secondary | ICD-10-CM | POA: Insufficient documentation

## 2020-08-21 DIAGNOSIS — M129 Arthropathy, unspecified: Secondary | ICD-10-CM | POA: Insufficient documentation

## 2020-08-21 DIAGNOSIS — N952 Postmenopausal atrophic vaginitis: Secondary | ICD-10-CM | POA: Insufficient documentation

## 2020-08-21 DIAGNOSIS — J069 Acute upper respiratory infection, unspecified: Secondary | ICD-10-CM | POA: Diagnosis not present

## 2020-08-21 DIAGNOSIS — Z20822 Contact with and (suspected) exposure to covid-19: Secondary | ICD-10-CM | POA: Diagnosis not present

## 2020-08-21 DIAGNOSIS — M810 Age-related osteoporosis without current pathological fracture: Secondary | ICD-10-CM | POA: Insufficient documentation

## 2020-08-21 DIAGNOSIS — E559 Vitamin D deficiency, unspecified: Secondary | ICD-10-CM | POA: Insufficient documentation

## 2020-08-21 DIAGNOSIS — F411 Generalized anxiety disorder: Secondary | ICD-10-CM | POA: Insufficient documentation

## 2020-08-21 DIAGNOSIS — J302 Other seasonal allergic rhinitis: Secondary | ICD-10-CM | POA: Insufficient documentation

## 2020-08-21 DIAGNOSIS — E782 Mixed hyperlipidemia: Secondary | ICD-10-CM | POA: Insufficient documentation

## 2020-08-30 DIAGNOSIS — R350 Frequency of micturition: Secondary | ICD-10-CM | POA: Diagnosis not present

## 2020-09-13 DIAGNOSIS — L4059 Other psoriatic arthropathy: Secondary | ICD-10-CM | POA: Diagnosis not present

## 2020-09-13 DIAGNOSIS — M150 Primary generalized (osteo)arthritis: Secondary | ICD-10-CM | POA: Diagnosis not present

## 2020-09-13 DIAGNOSIS — E669 Obesity, unspecified: Secondary | ICD-10-CM | POA: Diagnosis not present

## 2020-09-13 DIAGNOSIS — Z6836 Body mass index (BMI) 36.0-36.9, adult: Secondary | ICD-10-CM | POA: Diagnosis not present

## 2020-09-13 DIAGNOSIS — M5136 Other intervertebral disc degeneration, lumbar region: Secondary | ICD-10-CM | POA: Diagnosis not present

## 2020-09-13 DIAGNOSIS — R5382 Chronic fatigue, unspecified: Secondary | ICD-10-CM | POA: Diagnosis not present

## 2020-09-13 DIAGNOSIS — M810 Age-related osteoporosis without current pathological fracture: Secondary | ICD-10-CM | POA: Diagnosis not present

## 2020-09-21 DIAGNOSIS — R350 Frequency of micturition: Secondary | ICD-10-CM | POA: Diagnosis not present

## 2020-09-27 ENCOUNTER — Encounter: Payer: Self-pay | Admitting: Podiatry

## 2020-09-27 ENCOUNTER — Other Ambulatory Visit: Payer: Self-pay

## 2020-09-27 ENCOUNTER — Ambulatory Visit (INDEPENDENT_AMBULATORY_CARE_PROVIDER_SITE_OTHER): Payer: Medicare Other | Admitting: Podiatry

## 2020-09-27 DIAGNOSIS — Z923 Personal history of irradiation: Secondary | ICD-10-CM | POA: Insufficient documentation

## 2020-09-27 DIAGNOSIS — E119 Type 2 diabetes mellitus without complications: Secondary | ICD-10-CM

## 2020-09-27 DIAGNOSIS — H3530 Unspecified macular degeneration: Secondary | ICD-10-CM | POA: Insufficient documentation

## 2020-09-27 DIAGNOSIS — J302 Other seasonal allergic rhinitis: Secondary | ICD-10-CM | POA: Insufficient documentation

## 2020-09-27 DIAGNOSIS — M79674 Pain in right toe(s): Secondary | ICD-10-CM | POA: Diagnosis not present

## 2020-09-27 DIAGNOSIS — Z6836 Body mass index (BMI) 36.0-36.9, adult: Secondary | ICD-10-CM | POA: Insufficient documentation

## 2020-09-27 DIAGNOSIS — I359 Nonrheumatic aortic valve disorder, unspecified: Secondary | ICD-10-CM | POA: Insufficient documentation

## 2020-09-27 DIAGNOSIS — B351 Tinea unguium: Secondary | ICD-10-CM

## 2020-09-27 DIAGNOSIS — E010 Iodine-deficiency related diffuse (endemic) goiter: Secondary | ICD-10-CM | POA: Insufficient documentation

## 2020-09-27 DIAGNOSIS — E0590 Thyrotoxicosis, unspecified without thyrotoxic crisis or storm: Secondary | ICD-10-CM | POA: Insufficient documentation

## 2020-09-27 DIAGNOSIS — M79675 Pain in left toe(s): Secondary | ICD-10-CM

## 2020-09-27 DIAGNOSIS — M1990 Unspecified osteoarthritis, unspecified site: Secondary | ICD-10-CM | POA: Insufficient documentation

## 2020-09-27 DIAGNOSIS — M419 Scoliosis, unspecified: Secondary | ICD-10-CM | POA: Insufficient documentation

## 2020-09-27 DIAGNOSIS — F419 Anxiety disorder, unspecified: Secondary | ICD-10-CM | POA: Insufficient documentation

## 2020-09-27 DIAGNOSIS — R2681 Unsteadiness on feet: Secondary | ICD-10-CM | POA: Insufficient documentation

## 2020-09-27 NOTE — Progress Notes (Signed)
This patient returns to my office for at risk foot care.  This patient requires this care by a professional since this patient will be at risk due to having diabetes.  This patient is unable to cut nails himself since the patient cannot reach his nails.These nails are painful walking and wearing shoes.  Patient has not been seen in over 10 months.  This patient presents for at risk foot care today.  General Appearance  Alert, conversant and in no acute stress.  Vascular  Dorsalis pedis and posterior tibial  pulses are palpable  bilaterally.  Capillary return is within normal limits  bilaterally. Temperature is within normal limits  bilaterally.  Neurologic  Senn-Weinstein monofilament wire test within normal limits  bilaterally. Muscle power within normal limits bilaterally.  Nails Thick disfigured discolored nails with subungual debris  Hallux nails  B/L.[name] Kitchen No evidence of bacterial infection or drainage bilaterally.  Orthopedic  No limitations of motion  feet .  No crepitus or effusions noted.  No bony pathology or digital deformities noted. ADV 4th toe right foot.  Skin  normotropic skin with no porokeratosis noted bilaterally.  No signs of infections or ulcers noted.     Onychomycosis  Pain in right toes  Pain in left toes  Consent was obtained for treatment procedures.   Mechanical debridement of nails 1-5  bilaterally performed with a nail nipper.  Filed with dremel without incident. Padding dispensed for fourth toe right foot.   Return office visit  prn                   Told patient to return for periodic foot care and evaluation due to potential at risk complications.   Gardiner Barefoot DPM

## 2020-11-02 DIAGNOSIS — Z1231 Encounter for screening mammogram for malignant neoplasm of breast: Secondary | ICD-10-CM | POA: Diagnosis not present

## 2020-12-11 ENCOUNTER — Encounter: Payer: Self-pay | Admitting: Cardiology

## 2020-12-11 ENCOUNTER — Other Ambulatory Visit: Payer: Self-pay

## 2020-12-11 ENCOUNTER — Ambulatory Visit (INDEPENDENT_AMBULATORY_CARE_PROVIDER_SITE_OTHER): Payer: Medicare Other | Admitting: Cardiology

## 2020-12-11 VITALS — BP 130/60 | HR 72 | Ht 60.0 in | Wt 189.0 lb

## 2020-12-11 DIAGNOSIS — I359 Nonrheumatic aortic valve disorder, unspecified: Secondary | ICD-10-CM | POA: Diagnosis not present

## 2020-12-11 DIAGNOSIS — I350 Nonrheumatic aortic (valve) stenosis: Secondary | ICD-10-CM | POA: Diagnosis not present

## 2020-12-11 DIAGNOSIS — M0600 Rheumatoid arthritis without rheumatoid factor, unspecified site: Secondary | ICD-10-CM

## 2020-12-11 DIAGNOSIS — I10 Essential (primary) hypertension: Secondary | ICD-10-CM

## 2020-12-11 MED ORDER — LOSARTAN POTASSIUM-HCTZ 100-25 MG PO TABS: 1.0000 | ORAL_TABLET | Freq: Every day | ORAL | 3 refills | Status: DC

## 2020-12-11 NOTE — Addendum Note (Signed)
Addended by: Shellia Cleverly on: 12/11/2020 11:42 AM   Modules accepted: Orders

## 2020-12-11 NOTE — Progress Notes (Signed)
Cardiology Office Note:    Date:  12/11/2020   ID:  Lisa [name], DOB 1936-07-05, MRN [id number]  PCP:  Leighton Ruff, MD   [place] Providers Cardiologist:  Candee Furbish, MD     Referring MD: Leighton Ruff, MD     History of Present Illness:    Lisa [name] is a 85 y.o. female here for follow-up of aortic stenosis.  She sustained a close fall where she grabbed onto the bed to break her fall.  Pulled several muscles.  Called EMS.  She did not have to go to the hospital.  She is also had a few dental caries treated.  Oral thrush treated.  Earlier this year had radioactive iodine thyroid ablation.  Her brother had CABG 20 years ago.  Past Medical History:  Diagnosis Date  . Arthritis   . DDD (degenerative disc disease), cervical   . Deafness in right ear   . Diverticulosis   . Hearing loss in left ear   . Heart murmur   . Kidney stones   . Psoriasis     Past Surgical History:  Procedure Laterality Date  . APPENDECTOMY    . BILATERAL SALPINGOOPHORECTOMY  1996  . CATARACT EXTRACTION    . CHOLECYSTECTOMY  2002  . COLONOSCOPY  2003/2009/2015  . TOTAL ABDOMINAL HYSTERECTOMY  1979   FIBROIDS     Current Medications: Current Meds  Medication Sig  . acetaminophen (TYLENOL) 325 MG tablet Take 650 mg by mouth every 6 (six) hours as needed for mild pain or moderate pain. 3/4 tabs daily if needed  . amLODipine (NORVASC) 5 MG tablet Take 5 mg by mouth daily.   [name] Kitchen aspirin 81 MG tablet Take 81 mg by mouth every other day.  . Calcium Carbonate-Vit D-Min (CALTRATE PLUS PO) Take 1 tablet by mouth 2 (two) times daily.   . Cholecalciferol (VITAMIN D) 2000 units tablet Take 2,000 Units by mouth daily.  . ciprofloxacin (CIPRO) 250 MG tablet SMARTSIG:1 Tablet(s) By Mouth Every 12 Hours  . Cyanocobalamin (B-12) 1000 MCG CAPS Take by mouth daily.  . fexofenadine (ALLEGRA) 180 MG tablet Take 180 mg by mouth daily.  . fluticasone (FLONASE) 50 MCG/ACT nasal spray Place 1 spray  into both nostrils daily.   . folic acid (FOLVITE) 1 MG tablet Take 1 mg by mouth daily.   [name] Kitchen losartan-hydrochlorothiazide (HYZAAR) 100-25 MG tablet TAKE 1 TABLET BY MOUTH EVERY DAY  . meloxicam (MOBIC) 15 MG tablet Take 15 mg by mouth daily.  . methocarbamol (ROBAXIN) 500 MG tablet take 1 tablet by oral route 3 times per day prn  . methotrexate (RHEUMATREX) 2.5 MG tablet Take 10 mg by mouth 2 (two) times a week.   . Multiple Vitamins-Minerals (PRESERVISION AREDS) TABS Take 1 tablet by mouth daily.  [name] Kitchen nystatin (MYCOSTATIN) 100000 UNIT/ML suspension Take by mouth.  . nystatin cream (MYCOSTATIN) APPLY TO AFFECTED AREA AS NEEDED FOR 7 DAYS  . omeprazole (PRILOSEC) 20 MG capsule TAKE 1 CAPSULE BY MOUTH EVERY OTHER DAY  . Polyethyl Glycol-Propyl Glycol (SYSTANE) 0.4-0.3 % SOLN See admin instructions.  . predniSONE (STERAPRED UNI-PAK 48 TAB) 5 MG (48) TBPK tablet Take by mouth as directed.  . sodium chloride (OCEAN) 0.65 % SOLN nasal spray Place 1 spray into both nostrils as needed for congestion.  . traMADol (ULTRAM) 50 MG tablet Take 50 mg by mouth every 8 (eight) hours as needed.  . triamcinolone (KENALOG) 0.1 % Apply 1 application topically 2 (two) times daily.  Allergies:   Doxycycline, Lisinopril, and Penicillins   Social History   Socioeconomic History  . Marital status: Single    Spouse name: Not on file  . Number of children: Not on file  . Years of education: Not on file  . Highest education level: Not on file  Occupational History  . Not on file  Tobacco Use  . Smoking status: Former Research scientist (life sciences)  . Smokeless tobacco: Never Used  Vaping Use  . Vaping Use: Never used  Substance and Sexual Activity  . Alcohol use: Yes    Comment: WINE  . Drug use: No  . Sexual activity: Not on file  Other Topics Concern  . Not on file  Social History Narrative  . Not on file   Social Determinants of Health   Financial Resource Strain: Not on file  Food Insecurity: Not on file   Transportation Needs: Not on file  Physical Activity: Not on file  Stress: Not on file  Social Connections: Not on file     Family History: The patient's family history includes Goiter in her brother and mother; Heart disease in her brother, brother, father, mother, and paternal grandfather.  ROS:   Please see the history of present illness.     All other systems reviewed and are negative.  EKGs/Labs/Other Studies Reviewed:    The following studies were reviewed today:  ECHO 01/11/20: 1. Hyperdynamic LV systolic function; mild LVH; grade 1 diastolic  dysfunction; calcified aortic valve with mild AS (mean gradient 14 mmHg)  and mild AI; mild MR; mild LAE.  2. Left ventricular ejection fraction, by estimation, is >75%. The left  ventricle has hyperdynamic function. The left ventricle has no regional  wall motion abnormalities. There is mild left ventricular hypertrophy.  Left ventricular diastolic parameters  are consistent with Grade I diastolic dysfunction (impaired relaxation).  3. Right ventricular systolic function is normal. The right ventricular  size is normal.  4. Left atrial size was mildly dilated.  5. The mitral valve is normal in structure. No evidence of mitral valve  regurgitation. No evidence of mitral stenosis.  6. The aortic valve is tricuspid. Aortic valve regurgitation is mild.  Mild aortic valve stenosis.  7. The inferior vena cava is normal in size with greater than 50%  respiratory variability, suggesting right atrial pressure of 3 mmHg.   EKG: EKG performed today shows sinus rhythm 72 with sinus arrhythmia.  Sinus rhythm 62 on 12/16/2019.  Recent Labs: No results found for requested labs within last 8760 hours.  Recent Lipid Panel No results found for: CHOL, TRIG, HDL, CHOLHDL, VLDL, LDLCALC, LDLDIRECT   Risk Assessment/Calculations:      Physical Exam:    VS:  BP 130/60 (BP Location: Left Arm, Patient Position: Sitting, Cuff Size:  Normal)   Pulse 72   Ht 5' (1.524 m)   Wt 189 lb (85.7 kg)   LMP  (LMP Unknown)   SpO2 98%   BMI 36.91 kg/m     Wt Readings from Last 3 Encounters:  12/11/20 189 lb (85.7 kg)  12/16/19 192 lb 6.4 oz (87.3 kg)  12/09/18 197 lb (89.4 kg)     GEN:  Well nourished, well developed in no acute distress HEENT: Normal NECK: No JVD; No carotid bruits LYMPHATICS: No lymphadenopathy CARDIAC: RRR, 3/6 SM RUSB, no rubs, gallops RESPIRATORY:  Clear to auscultation without rales, wheezing or rhonchi  ABDOMEN: Soft, non-tender, non-distended MUSCULOSKELETAL:  No edema; No deformity  SKIN: Warm and dry NEUROLOGIC:  Alert and oriented x 3 PSYCHIATRIC:  Normal affect   ASSESSMENT:    1. Aortic valve disorder   2. Essential hypertension, benign   3. Nonrheumatic aortic valve stenosis   4. Rheumatoid arthritis with negative rheumatoid factor, involving unspecified site [facility])    PLAN:    In order of problems listed above:  Aortic stenosis - Murmur heard on exam.  Mild.  Echocardiogram 2021.  We will skip this year and then check echocardiogram next year prior to visit.  Essential hypertension - Continue with atorvastatin, losartan hydro chlorothiazide 100/25.  Atorvastatin dose is 5 mg once a day.  Refills as needed.  Family history of coronary artery disease - Her father, brother, 3 nephews all of had CAD.  Continue with aggressive secondary risk factor prevention.  She is on aspirin 81 mg every other day.  Rheumatoid arthritis - Has taken methotrexate.  Aspirin every other day.  Had thrush treated.     Medication Adjustments/Labs and Tests Ordered: Current medicines are reviewed at length with the patient today.  Concerns regarding medicines are outlined above.  Orders Placed This Encounter  Procedures  . EKG 12-Lead  . ECHOCARDIOGRAM COMPLETE   No orders of the defined types were placed in this encounter.   Patient Instructions  Medication Instructions:  The current  medical regimen is effective;  continue present plan and medications.  *If you need a refill on your cardiac medications before your next appointment, please call your pharmacy*  Testing/Procedures: Your physician has requested that you have an echocardiogram in 1 year. Echocardiography is a painless test that uses sound waves to create images of your heart. It provides your doctor with information about the size and shape of your heart and how well your heart's chambers and valves are working. This procedure takes approximately one hour. There are no restrictions for this procedure.  Follow-Up: At [facility], you and your health needs are our priority.  As part of our continuing mission to provide you with exceptional heart care, we have created designated Provider Care Teams.  These Care Teams include your primary Cardiologist (physician) and Advanced Practice Providers (APPs -  Physician Assistants and Nurse Practitioners) who all work together to provide you with the care you need, when you need it.  We recommend signing up for the patient portal called "MyChart".  Sign up information is provided on this After Visit Summary.  MyChart is used to connect with patients for Virtual Visits (Telemedicine).  Patients are able to view lab/test results, encounter notes, upcoming appointments, etc.  Non-urgent messages can be sent to your provider as well.   To learn more about what you can do with MyChart, go to [email or web address].    Your next appointment:   1 year(s)  The format for your next appointment:   In Person  Provider:   Candee Furbish, MD   Thank you for choosing [facility]!!        Signed, Candee Furbish, MD  12/11/2020 11:39 AM    [place]

## 2020-12-11 NOTE — Patient Instructions (Signed)
Medication Instructions:  The current medical regimen is effective;  continue present plan and medications.  *If you need a refill on your cardiac medications before your next appointment, please call your pharmacy*  Testing/Procedures: Your physician has requested that you have an echocardiogram in 1 year. Echocardiography is a painless test that uses sound waves to create images of your heart. It provides your doctor with information about the size and shape of your heart and how well your heart's chambers and valves are working. This procedure takes approximately one hour. There are no restrictions for this procedure.  Follow-Up: At [facility], you and your health needs are our priority.  As part of our continuing mission to provide you with exceptional heart care, we have created designated Provider Care Teams.  These Care Teams include your primary Cardiologist (physician) and Advanced Practice Providers (APPs -  Physician Assistants and Nurse Practitioners) who all work together to provide you with the care you need, when you need it.  We recommend signing up for the patient portal called "MyChart".  Sign up information is provided on this After Visit Summary.  MyChart is used to connect with patients for Virtual Visits (Telemedicine).  Patients are able to view lab/test results, encounter notes, upcoming appointments, etc.  Non-urgent messages can be sent to your provider as well.   To learn more about what you can do with MyChart, go to [email or web address].    Your next appointment:   1 year(s)  The format for your next appointment:   In Person  Provider:   Mark Skains, MD   Thank you for choosing [facility]!!    

## 2020-12-12 DIAGNOSIS — M150 Primary generalized (osteo)arthritis: Secondary | ICD-10-CM | POA: Diagnosis not present

## 2020-12-12 DIAGNOSIS — E669 Obesity, unspecified: Secondary | ICD-10-CM | POA: Diagnosis not present

## 2020-12-12 DIAGNOSIS — Z6837 Body mass index (BMI) 37.0-37.9, adult: Secondary | ICD-10-CM | POA: Diagnosis not present

## 2020-12-12 DIAGNOSIS — R5382 Chronic fatigue, unspecified: Secondary | ICD-10-CM | POA: Diagnosis not present

## 2020-12-12 DIAGNOSIS — L4059 Other psoriatic arthropathy: Secondary | ICD-10-CM | POA: Diagnosis not present

## 2020-12-12 DIAGNOSIS — M810 Age-related osteoporosis without current pathological fracture: Secondary | ICD-10-CM | POA: Diagnosis not present

## 2020-12-12 DIAGNOSIS — M5136 Other intervertebral disc degeneration, lumbar region: Secondary | ICD-10-CM | POA: Diagnosis not present

## 2020-12-12 DIAGNOSIS — R7989 Other specified abnormal findings of blood chemistry: Secondary | ICD-10-CM | POA: Diagnosis not present

## 2020-12-19 ENCOUNTER — Ambulatory Visit: Payer: Medicare Other | Admitting: Cardiology

## 2021-01-01 DIAGNOSIS — J019 Acute sinusitis, unspecified: Secondary | ICD-10-CM | POA: Diagnosis not present

## 2021-01-01 DIAGNOSIS — L02414 Cutaneous abscess of left upper limb: Secondary | ICD-10-CM | POA: Diagnosis not present

## 2021-01-04 DIAGNOSIS — U071 COVID-19: Secondary | ICD-10-CM | POA: Diagnosis not present

## 2021-01-29 ENCOUNTER — Ambulatory Visit: Payer: Medicare Other | Attending: Critical Care Medicine

## 2021-01-29 DIAGNOSIS — Z20822 Contact with and (suspected) exposure to covid-19: Secondary | ICD-10-CM | POA: Diagnosis not present

## 2021-01-30 LAB — NOVEL CORONAVIRUS, NAA: SARS-CoV-2, NAA: NOT DETECTED

## 2021-01-30 LAB — SARS-COV-2, NAA 2 DAY TAT

## 2021-02-12 DIAGNOSIS — Z0000 Encounter for general adult medical examination without abnormal findings: Secondary | ICD-10-CM | POA: Diagnosis not present

## 2021-02-13 DIAGNOSIS — E0590 Thyrotoxicosis, unspecified without thyrotoxic crisis or storm: Secondary | ICD-10-CM | POA: Diagnosis not present

## 2021-02-13 DIAGNOSIS — R350 Frequency of micturition: Secondary | ICD-10-CM | POA: Diagnosis not present

## 2021-02-13 DIAGNOSIS — E1169 Type 2 diabetes mellitus with other specified complication: Secondary | ICD-10-CM | POA: Diagnosis not present

## 2021-02-13 DIAGNOSIS — I10 Essential (primary) hypertension: Secondary | ICD-10-CM | POA: Diagnosis not present

## 2021-02-13 DIAGNOSIS — E010 Iodine-deficiency related diffuse (endemic) goiter: Secondary | ICD-10-CM | POA: Diagnosis not present

## 2021-02-13 DIAGNOSIS — J302 Other seasonal allergic rhinitis: Secondary | ICD-10-CM | POA: Diagnosis not present

## 2021-02-13 DIAGNOSIS — E782 Mixed hyperlipidemia: Secondary | ICD-10-CM | POA: Diagnosis not present

## 2021-02-13 DIAGNOSIS — E559 Vitamin D deficiency, unspecified: Secondary | ICD-10-CM | POA: Diagnosis not present

## 2021-02-17 DIAGNOSIS — M25551 Pain in right hip: Secondary | ICD-10-CM | POA: Diagnosis not present

## 2021-02-17 DIAGNOSIS — M545 Low back pain, unspecified: Secondary | ICD-10-CM | POA: Diagnosis not present

## 2021-02-21 DIAGNOSIS — M47897 Other spondylosis, lumbosacral region: Secondary | ICD-10-CM | POA: Diagnosis not present

## 2021-02-21 DIAGNOSIS — R262 Difficulty in walking, not elsewhere classified: Secondary | ICD-10-CM | POA: Diagnosis not present

## 2021-02-21 DIAGNOSIS — M6281 Muscle weakness (generalized): Secondary | ICD-10-CM | POA: Diagnosis not present

## 2021-02-26 DIAGNOSIS — M6281 Muscle weakness (generalized): Secondary | ICD-10-CM | POA: Diagnosis not present

## 2021-02-26 DIAGNOSIS — M47897 Other spondylosis, lumbosacral region: Secondary | ICD-10-CM | POA: Diagnosis not present

## 2021-02-26 DIAGNOSIS — R262 Difficulty in walking, not elsewhere classified: Secondary | ICD-10-CM | POA: Diagnosis not present

## 2021-02-28 DIAGNOSIS — M6281 Muscle weakness (generalized): Secondary | ICD-10-CM | POA: Diagnosis not present

## 2021-02-28 DIAGNOSIS — R262 Difficulty in walking, not elsewhere classified: Secondary | ICD-10-CM | POA: Diagnosis not present

## 2021-02-28 DIAGNOSIS — M47897 Other spondylosis, lumbosacral region: Secondary | ICD-10-CM | POA: Diagnosis not present

## 2021-03-06 DIAGNOSIS — R262 Difficulty in walking, not elsewhere classified: Secondary | ICD-10-CM | POA: Diagnosis not present

## 2021-03-06 DIAGNOSIS — M47897 Other spondylosis, lumbosacral region: Secondary | ICD-10-CM | POA: Diagnosis not present

## 2021-03-06 DIAGNOSIS — M6281 Muscle weakness (generalized): Secondary | ICD-10-CM | POA: Diagnosis not present

## 2021-03-09 DIAGNOSIS — M47897 Other spondylosis, lumbosacral region: Secondary | ICD-10-CM | POA: Diagnosis not present

## 2021-03-09 DIAGNOSIS — R262 Difficulty in walking, not elsewhere classified: Secondary | ICD-10-CM | POA: Diagnosis not present

## 2021-03-09 DIAGNOSIS — M6281 Muscle weakness (generalized): Secondary | ICD-10-CM | POA: Diagnosis not present

## 2021-03-12 DIAGNOSIS — R262 Difficulty in walking, not elsewhere classified: Secondary | ICD-10-CM | POA: Diagnosis not present

## 2021-03-12 DIAGNOSIS — M6281 Muscle weakness (generalized): Secondary | ICD-10-CM | POA: Diagnosis not present

## 2021-03-12 DIAGNOSIS — M47897 Other spondylosis, lumbosacral region: Secondary | ICD-10-CM | POA: Diagnosis not present

## 2021-03-12 DIAGNOSIS — M79672 Pain in left foot: Secondary | ICD-10-CM | POA: Diagnosis not present

## 2021-03-12 DIAGNOSIS — M79671 Pain in right foot: Secondary | ICD-10-CM | POA: Diagnosis not present

## 2021-03-14 ENCOUNTER — Other Ambulatory Visit: Payer: Self-pay | Admitting: Cardiology

## 2021-03-14 DIAGNOSIS — M5136 Other intervertebral disc degeneration, lumbar region: Secondary | ICD-10-CM | POA: Diagnosis not present

## 2021-03-14 DIAGNOSIS — Z6838 Body mass index (BMI) 38.0-38.9, adult: Secondary | ICD-10-CM | POA: Diagnosis not present

## 2021-03-14 DIAGNOSIS — E669 Obesity, unspecified: Secondary | ICD-10-CM | POA: Diagnosis not present

## 2021-03-14 DIAGNOSIS — M150 Primary generalized (osteo)arthritis: Secondary | ICD-10-CM | POA: Diagnosis not present

## 2021-03-14 DIAGNOSIS — L4059 Other psoriatic arthropathy: Secondary | ICD-10-CM | POA: Diagnosis not present

## 2021-03-14 DIAGNOSIS — R5382 Chronic fatigue, unspecified: Secondary | ICD-10-CM | POA: Diagnosis not present

## 2021-03-14 DIAGNOSIS — R7989 Other specified abnormal findings of blood chemistry: Secondary | ICD-10-CM | POA: Diagnosis not present

## 2021-03-14 DIAGNOSIS — M810 Age-related osteoporosis without current pathological fracture: Secondary | ICD-10-CM | POA: Diagnosis not present

## 2021-03-15 DIAGNOSIS — R262 Difficulty in walking, not elsewhere classified: Secondary | ICD-10-CM | POA: Diagnosis not present

## 2021-03-15 DIAGNOSIS — M47897 Other spondylosis, lumbosacral region: Secondary | ICD-10-CM | POA: Diagnosis not present

## 2021-03-15 DIAGNOSIS — M6281 Muscle weakness (generalized): Secondary | ICD-10-CM | POA: Diagnosis not present

## 2021-03-19 DIAGNOSIS — M47894 Other spondylosis, thoracic region: Secondary | ICD-10-CM | POA: Diagnosis not present

## 2021-03-19 DIAGNOSIS — M6281 Muscle weakness (generalized): Secondary | ICD-10-CM | POA: Diagnosis not present

## 2021-03-19 DIAGNOSIS — R262 Difficulty in walking, not elsewhere classified: Secondary | ICD-10-CM | POA: Diagnosis not present

## 2021-03-21 DIAGNOSIS — M6281 Muscle weakness (generalized): Secondary | ICD-10-CM | POA: Diagnosis not present

## 2021-03-21 DIAGNOSIS — R262 Difficulty in walking, not elsewhere classified: Secondary | ICD-10-CM | POA: Diagnosis not present

## 2021-03-21 DIAGNOSIS — M545 Low back pain, unspecified: Secondary | ICD-10-CM | POA: Diagnosis not present

## 2021-03-21 DIAGNOSIS — M47897 Other spondylosis, lumbosacral region: Secondary | ICD-10-CM | POA: Diagnosis not present

## 2021-03-22 ENCOUNTER — Other Ambulatory Visit: Payer: Self-pay | Admitting: Orthopaedic Surgery

## 2021-03-22 DIAGNOSIS — M545 Low back pain, unspecified: Secondary | ICD-10-CM

## 2021-03-28 DIAGNOSIS — M546 Pain in thoracic spine: Secondary | ICD-10-CM | POA: Diagnosis not present

## 2021-03-28 DIAGNOSIS — M545 Low back pain, unspecified: Secondary | ICD-10-CM | POA: Diagnosis not present

## 2021-04-03 ENCOUNTER — Ambulatory Visit (INDEPENDENT_AMBULATORY_CARE_PROVIDER_SITE_OTHER): Payer: Medicare Other | Admitting: Ophthalmology

## 2021-04-03 ENCOUNTER — Other Ambulatory Visit: Payer: Self-pay

## 2021-04-03 ENCOUNTER — Encounter (INDEPENDENT_AMBULATORY_CARE_PROVIDER_SITE_OTHER): Payer: Self-pay | Admitting: Ophthalmology

## 2021-04-03 DIAGNOSIS — H43811 Vitreous degeneration, right eye: Secondary | ICD-10-CM | POA: Diagnosis not present

## 2021-04-03 DIAGNOSIS — H353132 Nonexudative age-related macular degeneration, bilateral, intermediate dry stage: Secondary | ICD-10-CM | POA: Diagnosis not present

## 2021-04-03 NOTE — Progress Notes (Signed)
04/03/2021     CHIEF COMPLAINT Patient presents for  Chief Complaint  Patient presents with   Retina Follow Up      HISTORY OF PRESENT ILLNESS: Lisa [name] is a 85 y.o. female who presents to the clinic today for:   HPI     Retina Follow Up   Patient presents with  Dry AMD.  In both eyes.  This started 1 year ago.  Severity is mild.  Duration of 1 year.  Since onset it is gradually worsening.        Comments   1 yr fu ou oct. Pt states she is having a lot of crustiness coming out of her eyes recently, she uses systane ou prn, her dry eye problems makes her vision blurred sometimes. Denies changes to FOL or floaters. Pt does not use any prescription eye drops. LBS: does not check, pt states "lab work a couple weeks ago it was 65."       Last edited by Laurin Coder on 04/03/2021 12:59 PM.      Referring physician: Leighton Ruff, MD [place],  [place] [id number]  HISTORICAL INFORMATION:   Selected notes from the MEDICAL RECORD NUMBER       CURRENT MEDICATIONS: Current Outpatient Medications (Ophthalmic Drugs)  Medication Sig   Polyethyl Glycol-Propyl Glycol (SYSTANE) 0.4-0.3 % SOLN See admin instructions.   No current facility-administered medications for this visit. (Ophthalmic Drugs)   Current Outpatient Medications (Other)  Medication Sig   acetaminophen (TYLENOL) 325 MG tablet Take 650 mg by mouth every 6 (six) hours as needed for mild pain or moderate pain. 3/4 tabs daily if needed   amLODipine (NORVASC) 5 MG tablet Take 5 mg by mouth daily.    aspirin 81 MG tablet Take 81 mg by mouth every other day.   Calcium Carbonate-Vit D-Min (CALTRATE PLUS PO) Take 1 tablet by mouth 2 (two) times daily.    Cholecalciferol (VITAMIN D) 2000 units tablet Take 2,000 Units by mouth daily.   ciprofloxacin (CIPRO) 250 MG tablet SMARTSIG:1 Tablet(s) By Mouth Every 12 Hours   Cyanocobalamin (B-12) 1000 MCG CAPS Take by mouth daily.   fexofenadine  (ALLEGRA) 180 MG tablet Take 180 mg by mouth daily.   fluticasone (FLONASE) 50 MCG/ACT nasal spray Place 1 spray into both nostrils daily.    folic acid (FOLVITE) 1 MG tablet Take 1 mg by mouth daily.    losartan-hydrochlorothiazide (HYZAAR) 100-25 MG tablet TAKE 1 TABLET BY MOUTH EVERY DAY   meloxicam (MOBIC) 15 MG tablet Take 15 mg by mouth daily.   methocarbamol (ROBAXIN) 500 MG tablet take 1 tablet by oral route 3 times per day prn   methotrexate (RHEUMATREX) 2.5 MG tablet Take 10 mg by mouth 2 (two) times a week.    Multiple Vitamins-Minerals (PRESERVISION AREDS) TABS Take 1 tablet by mouth daily.   nystatin (MYCOSTATIN) 100000 UNIT/ML suspension Take by mouth.   nystatin cream (MYCOSTATIN) APPLY TO AFFECTED AREA AS NEEDED FOR 7 DAYS   omeprazole (PRILOSEC) 20 MG capsule TAKE 1 CAPSULE BY MOUTH EVERY OTHER DAY   predniSONE (STERAPRED UNI-PAK 48 TAB) 5 MG (48) TBPK tablet Take by mouth as directed.   sodium chloride (OCEAN) 0.65 % SOLN nasal spray Place 1 spray into both nostrils as needed for congestion.   traMADol (ULTRAM) 50 MG tablet Take 50 mg by mouth every 8 (eight) hours as needed.   triamcinolone (KENALOG) 0.1 % Apply 1 application topically 2 (two)  times daily.   No current facility-administered medications for this visit. (Other)      REVIEW OF SYSTEMS:    ALLERGIES Allergies  Allergen Reactions   Doxycycline    Lisinopril     Cough    Penicillins     whelp    PAST MEDICAL HISTORY Past Medical History:  Diagnosis Date   Arthritis    DDD (degenerative disc disease), cervical    Deafness in right ear    Diverticulosis    Hearing loss in left ear    Heart murmur    Kidney stones    Psoriasis    Past Surgical History:  Procedure Laterality Date   APPENDECTOMY     BILATERAL SALPINGOOPHORECTOMY  1996   CATARACT EXTRACTION     CHOLECYSTECTOMY  2002   COLONOSCOPY  2003/2009/2015   TOTAL ABDOMINAL HYSTERECTOMY  1979   FIBROIDS     FAMILY  HISTORY Family History  Problem Relation Age of Onset   Heart disease Mother    Goiter Mother    Heart disease Father    Heart disease Brother    Goiter Brother    Heart disease Paternal Grandfather    Heart disease Brother     SOCIAL HISTORY Social History   Tobacco Use   Smoking status: Former   Smokeless tobacco: Never  Scientific laboratory technician Use: Never used  Substance Use Topics   Alcohol use: Yes    Comment: WINE   Drug use: No         OPHTHALMIC EXAM:  Base Eye Exam     Visual Acuity (ETDRS)       Right Left   Dist cc 20/30 +1 20/20 -2    Correction: Glasses         Tonometry (Tonopen, 1:04 PM)       Right Left   Pressure 21 19         Pupils       Pupils APD   Right PERRL None   Left PERRL None         Visual Fields (Counting fingers)       Left Right    Full Full         Extraocular Movement       Right Left    Full Full         Neuro/Psych     Oriented x3: Yes   Mood/Affect: Normal         Dilation     Both eyes: 1.0% Mydriacyl, 2.5% Phenylephrine @ 1:04 PM           Slit Lamp and Fundus Exam     External Exam       Right Left   External Normal Normal         Slit Lamp Exam       Right Left   Lids/Lashes Normal Normal   Conjunctiva/Sclera White and quiet White and quiet   Cornea Clear Clear   Anterior Chamber Deep and quiet Deep and quiet   Iris Round and reactive Round and reactive   Lens Posterior chamber intraocular lens Posterior chamber intraocular lens   Anterior Vitreous Normal Normal         Fundus Exam       Right Left   Posterior Vitreous Posterior vitreous detachment Posterior vitreous detachment   Disc Normal Normal   C/D Ratio 0.35 0.35   Macula Intermediate age related macular degeneration, Pigmented atrophy, no exudates,  no macular thickening, Geographic atrophy Intermediate age related macular degeneration, Pigmented atrophy, no exudates, no macular thickening, Geographic  atrophy   Vessels Normal Normal   Periphery Normal Normal            IMAGING AND PROCEDURES  Imaging and Procedures for 04/03/21  OCT, Retina - OU - Both Eyes       Right Eye Quality was good. Scan locations included subfoveal. Central Foveal Thickness: 269. Findings include abnormal foveal contour, retinal drusen , outer retinal atrophy.   Left Eye Quality was good. Scan locations included subfoveal. Central Foveal Thickness: 239. Progression has been stable. Findings include abnormal foveal contour, retinal drusen , outer retinal atrophy.   Notes No signs of active CNVM OU, will observe             ASSESSMENT/PLAN:  Intermediate stage nonexudative age-related macular degeneration of both eyes OU with geographic atrophy, not foveal involvement will observe  Posterior vitreous detachment of right eye  The nature of posterior vitreous detachment was discussed with the patient as well as its physiology, its age prevalence, and its possible implication regarding retinal breaks and detachment.  An informational brochure was offered to the patient.  All the patient's questions were answered.  The patient was asked to return if new or different flashes or floaters develops.   Patient was instructed to contact office immediately if any new changes were noticed. I explained to the patient that vitreous inside the eye is similar to jello inside a bowl. As the jello melts it can start to pull away from the bowl, similarly the vitreous throughout our lives can begin to pull away from the retina. That process is called a posterior vitreous detachment. In some cases, the vitreous can tug hard enough on the retina to form a retinal tear. I discussed with the patient the signs and symptoms of a retinal detachment.  Do not rub the eye.       ICD-10-CM   1. Intermediate stage nonexudative age-related macular degeneration of both eyes  H35.3132 OCT, Retina - OU - Both Eyes    2. Posterior  vitreous detachment of right eye  H43.811       1.  No signs of significant progression of geographic atrophy with intermediate ARMD OU.  Will observe OU.  2.  3.  Ophthalmic Meds Ordered this visit:  No orders of the defined types were placed in this encounter.      Return in about 1 year (around 04/03/2022) for DILATE OU, COLOR FP, OCT.  There are no Patient Instructions on file for this visit.   Explained the diagnoses, plan, and follow up with the patient and they expressed understanding.  Patient expressed understanding of the importance of proper follow up care.   Clent Demark Capricia Serda M.D. Diseases & Surgery of the Retina and Vitreous [facility] [place] 04/03/21     Abbreviations: M myopia (nearsighted); A astigmatism; H hyperopia (farsighted); P presbyopia; Mrx spectacle prescription;  CTL contact lenses; OD right eye; OS left eye; OU both eyes  XT exotropia; ET esotropia; PEK punctate epithelial keratitis; PEE punctate epithelial erosions; DES dry eye syndrome; MGD meibomian gland dysfunction; ATs artificial tears; PFAT's preservative free artificial tears; [place] nuclear sclerotic cataract; PSC posterior subcapsular cataract; ERM epi-retinal membrane; PVD posterior vitreous detachment; RD retinal detachment; DM diabetes mellitus; DR diabetic retinopathy; NPDR non-proliferative diabetic retinopathy; PDR proliferative diabetic retinopathy; CSME clinically significant macular edema; DME diabetic macular edema; dbh dot blot hemorrhages;  CWS cotton wool spot; POAG primary open angle glaucoma; C/D cup-to-disc ratio; HVF humphrey visual field; GVF goldmann visual field; OCT optical coherence tomography; IOP intraocular pressure; BRVO Branch retinal vein occlusion; CRVO central retinal vein occlusion; CRAO central retinal artery occlusion; BRAO branch retinal artery occlusion; RT retinal tear; SB scleral buckle; PPV pars plana vitrectomy; VH Vitreous hemorrhage; PRP panretinal laser  photocoagulation; IVK intravitreal kenalog; VMT vitreomacular traction; MH Macular hole;  NVD neovascularization of the disc; NVE neovascularization elsewhere; AREDS age related eye disease study; ARMD age related macular degeneration; POAG primary open angle glaucoma; EBMD epithelial/anterior basement membrane dystrophy; ACIOL anterior chamber intraocular lens; IOL intraocular lens; PCIOL posterior chamber intraocular lens; Phaco/IOL phacoemulsification with intraocular lens placement; [place] photorefractive keratectomy; LASIK laser assisted in situ keratomileusis; HTN hypertension; DM diabetes mellitus; COPD chronic obstructive pulmonary disease

## 2021-04-03 NOTE — Assessment & Plan Note (Signed)
OU with geographic atrophy, not foveal involvement will observe

## 2021-04-03 NOTE — Assessment & Plan Note (Signed)

## 2021-04-04 DIAGNOSIS — H903 Sensorineural hearing loss, bilateral: Secondary | ICD-10-CM | POA: Diagnosis not present

## 2021-04-05 DIAGNOSIS — H9041 Sensorineural hearing loss, unilateral, right ear, with unrestricted hearing on the contralateral side: Secondary | ICD-10-CM | POA: Diagnosis not present

## 2021-04-05 DIAGNOSIS — H903 Sensorineural hearing loss, bilateral: Secondary | ICD-10-CM | POA: Insufficient documentation

## 2021-04-05 DIAGNOSIS — H90A22 Sensorineural hearing loss, unilateral, left ear, with restricted hearing on the contralateral side: Secondary | ICD-10-CM | POA: Diagnosis not present

## 2021-04-05 DIAGNOSIS — D447 Neoplasm of uncertain behavior of aortic body and other paraganglia: Secondary | ICD-10-CM | POA: Diagnosis not present

## 2021-04-05 DIAGNOSIS — L4050 Arthropathic psoriasis, unspecified: Secondary | ICD-10-CM | POA: Diagnosis not present

## 2021-04-05 DIAGNOSIS — H3530 Unspecified macular degeneration: Secondary | ICD-10-CM | POA: Diagnosis not present

## 2021-04-06 DIAGNOSIS — L814 Other melanin hyperpigmentation: Secondary | ICD-10-CM | POA: Diagnosis not present

## 2021-04-06 DIAGNOSIS — D1801 Hemangioma of skin and subcutaneous tissue: Secondary | ICD-10-CM | POA: Diagnosis not present

## 2021-04-06 DIAGNOSIS — L821 Other seborrheic keratosis: Secondary | ICD-10-CM | POA: Diagnosis not present

## 2021-04-06 DIAGNOSIS — D225 Melanocytic nevi of trunk: Secondary | ICD-10-CM | POA: Diagnosis not present

## 2021-04-10 ENCOUNTER — Other Ambulatory Visit: Payer: Self-pay

## 2021-04-10 ENCOUNTER — Ambulatory Visit
Admission: RE | Admit: 2021-04-10 | Discharge: 2021-04-10 | Disposition: A | Discharge status: Home / Self Care | Payer: Medicare Other | Source: Ambulatory Visit | Attending: Orthopaedic Surgery | Admitting: Orthopaedic Surgery

## 2021-04-10 DIAGNOSIS — M545 Low back pain, unspecified: Secondary | ICD-10-CM

## 2021-04-10 DIAGNOSIS — M48061 Spinal stenosis, lumbar region without neurogenic claudication: Secondary | ICD-10-CM | POA: Diagnosis not present

## 2021-04-16 DIAGNOSIS — M545 Low back pain, unspecified: Secondary | ICD-10-CM | POA: Diagnosis not present

## 2021-04-23 DIAGNOSIS — M47816 Spondylosis without myelopathy or radiculopathy, lumbar region: Secondary | ICD-10-CM | POA: Diagnosis not present

## 2021-04-25 DIAGNOSIS — Z23 Encounter for immunization: Secondary | ICD-10-CM | POA: Diagnosis not present

## 2021-05-10 DIAGNOSIS — M47816 Spondylosis without myelopathy or radiculopathy, lumbar region: Secondary | ICD-10-CM | POA: Diagnosis not present

## 2021-06-04 DIAGNOSIS — M5416 Radiculopathy, lumbar region: Secondary | ICD-10-CM | POA: Diagnosis not present

## 2021-06-06 DIAGNOSIS — M5416 Radiculopathy, lumbar region: Secondary | ICD-10-CM | POA: Diagnosis not present

## 2021-06-06 DIAGNOSIS — M479 Spondylosis, unspecified: Secondary | ICD-10-CM | POA: Diagnosis not present

## 2021-06-08 DIAGNOSIS — Z23 Encounter for immunization: Secondary | ICD-10-CM | POA: Diagnosis not present

## 2021-06-11 DIAGNOSIS — L4059 Other psoriatic arthropathy: Secondary | ICD-10-CM | POA: Diagnosis not present

## 2021-06-12 DIAGNOSIS — M5416 Radiculopathy, lumbar region: Secondary | ICD-10-CM | POA: Diagnosis not present

## 2021-06-12 DIAGNOSIS — M479 Spondylosis, unspecified: Secondary | ICD-10-CM | POA: Diagnosis not present

## 2021-06-21 DIAGNOSIS — M5416 Radiculopathy, lumbar region: Secondary | ICD-10-CM | POA: Diagnosis not present

## 2021-06-21 DIAGNOSIS — M479 Spondylosis, unspecified: Secondary | ICD-10-CM | POA: Diagnosis not present

## 2021-06-25 DIAGNOSIS — M5416 Radiculopathy, lumbar region: Secondary | ICD-10-CM | POA: Diagnosis not present

## 2021-06-25 DIAGNOSIS — M479 Spondylosis, unspecified: Secondary | ICD-10-CM | POA: Diagnosis not present

## 2021-06-28 DIAGNOSIS — M5416 Radiculopathy, lumbar region: Secondary | ICD-10-CM | POA: Diagnosis not present

## 2021-06-28 DIAGNOSIS — M479 Spondylosis, unspecified: Secondary | ICD-10-CM | POA: Diagnosis not present

## 2021-07-06 DIAGNOSIS — H04123 Dry eye syndrome of bilateral lacrimal glands: Secondary | ICD-10-CM | POA: Diagnosis not present

## 2021-07-06 DIAGNOSIS — Z923 Personal history of irradiation: Secondary | ICD-10-CM | POA: Diagnosis not present

## 2021-07-06 DIAGNOSIS — R682 Dry mouth, unspecified: Secondary | ICD-10-CM | POA: Diagnosis not present

## 2021-07-06 DIAGNOSIS — E0520 Thyrotoxicosis with toxic multinodular goiter without thyrotoxic crisis or storm: Secondary | ICD-10-CM | POA: Diagnosis not present

## 2021-07-27 DIAGNOSIS — M5416 Radiculopathy, lumbar region: Secondary | ICD-10-CM | POA: Diagnosis not present

## 2021-09-03 ENCOUNTER — Other Ambulatory Visit: Payer: Self-pay

## 2021-09-03 MED ORDER — LOSARTAN POTASSIUM-HCTZ 100-25 MG PO TABS: 1.0000 | ORAL_TABLET | Freq: Every day | ORAL | 1 refills | Status: DC

## 2021-09-03 NOTE — Telephone Encounter (Signed)
Pt's medication was sent to pt's pharmacy as requested. Confirmation received.  °

## 2021-09-12 DIAGNOSIS — Z961 Presence of intraocular lens: Secondary | ICD-10-CM | POA: Diagnosis not present

## 2021-09-12 DIAGNOSIS — H5213 Myopia, bilateral: Secondary | ICD-10-CM | POA: Diagnosis not present

## 2021-09-12 DIAGNOSIS — H353130 Nonexudative age-related macular degeneration, bilateral, stage unspecified: Secondary | ICD-10-CM | POA: Diagnosis not present

## 2021-09-12 DIAGNOSIS — H04123 Dry eye syndrome of bilateral lacrimal glands: Secondary | ICD-10-CM | POA: Diagnosis not present

## 2021-09-12 DIAGNOSIS — E119 Type 2 diabetes mellitus without complications: Secondary | ICD-10-CM | POA: Diagnosis not present

## 2021-09-12 DIAGNOSIS — H52223 Regular astigmatism, bilateral: Secondary | ICD-10-CM | POA: Diagnosis not present

## 2021-09-12 DIAGNOSIS — H524 Presbyopia: Secondary | ICD-10-CM | POA: Diagnosis not present

## 2021-09-17 DIAGNOSIS — Z6838 Body mass index (BMI) 38.0-38.9, adult: Secondary | ICD-10-CM | POA: Diagnosis not present

## 2021-09-17 DIAGNOSIS — L4059 Other psoriatic arthropathy: Secondary | ICD-10-CM | POA: Diagnosis not present

## 2021-09-17 DIAGNOSIS — M150 Primary generalized (osteo)arthritis: Secondary | ICD-10-CM | POA: Diagnosis not present

## 2021-09-17 DIAGNOSIS — R7989 Other specified abnormal findings of blood chemistry: Secondary | ICD-10-CM | POA: Diagnosis not present

## 2021-09-17 DIAGNOSIS — R5382 Chronic fatigue, unspecified: Secondary | ICD-10-CM | POA: Diagnosis not present

## 2021-09-17 DIAGNOSIS — M810 Age-related osteoporosis without current pathological fracture: Secondary | ICD-10-CM | POA: Diagnosis not present

## 2021-09-17 DIAGNOSIS — M5136 Other intervertebral disc degeneration, lumbar region: Secondary | ICD-10-CM | POA: Diagnosis not present

## 2021-09-17 DIAGNOSIS — E669 Obesity, unspecified: Secondary | ICD-10-CM | POA: Diagnosis not present

## 2021-09-20 DIAGNOSIS — I10 Essential (primary) hypertension: Secondary | ICD-10-CM | POA: Diagnosis not present

## 2021-09-20 DIAGNOSIS — L4050 Arthropathic psoriasis, unspecified: Secondary | ICD-10-CM | POA: Diagnosis not present

## 2021-09-20 DIAGNOSIS — E1169 Type 2 diabetes mellitus with other specified complication: Secondary | ICD-10-CM | POA: Diagnosis not present

## 2021-09-20 DIAGNOSIS — E782 Mixed hyperlipidemia: Secondary | ICD-10-CM | POA: Diagnosis not present

## 2021-11-12 DIAGNOSIS — M5416 Radiculopathy, lumbar region: Secondary | ICD-10-CM | POA: Diagnosis not present

## 2021-11-23 DIAGNOSIS — M5416 Radiculopathy, lumbar region: Secondary | ICD-10-CM | POA: Diagnosis not present

## 2021-11-23 DIAGNOSIS — R262 Difficulty in walking, not elsewhere classified: Secondary | ICD-10-CM | POA: Diagnosis not present

## 2021-11-23 DIAGNOSIS — M6281 Muscle weakness (generalized): Secondary | ICD-10-CM | POA: Diagnosis not present

## 2021-11-28 DIAGNOSIS — M6281 Muscle weakness (generalized): Secondary | ICD-10-CM | POA: Diagnosis not present

## 2021-11-28 DIAGNOSIS — M5416 Radiculopathy, lumbar region: Secondary | ICD-10-CM | POA: Diagnosis not present

## 2021-11-28 DIAGNOSIS — R262 Difficulty in walking, not elsewhere classified: Secondary | ICD-10-CM | POA: Diagnosis not present

## 2021-11-30 DIAGNOSIS — R262 Difficulty in walking, not elsewhere classified: Secondary | ICD-10-CM | POA: Diagnosis not present

## 2021-11-30 DIAGNOSIS — M5416 Radiculopathy, lumbar region: Secondary | ICD-10-CM | POA: Diagnosis not present

## 2021-11-30 DIAGNOSIS — M6281 Muscle weakness (generalized): Secondary | ICD-10-CM | POA: Diagnosis not present

## 2021-12-03 DIAGNOSIS — M6281 Muscle weakness (generalized): Secondary | ICD-10-CM | POA: Diagnosis not present

## 2021-12-03 DIAGNOSIS — M5416 Radiculopathy, lumbar region: Secondary | ICD-10-CM | POA: Diagnosis not present

## 2021-12-03 DIAGNOSIS — R262 Difficulty in walking, not elsewhere classified: Secondary | ICD-10-CM | POA: Diagnosis not present

## 2021-12-07 ENCOUNTER — Ambulatory Visit (HOSPITAL_COMMUNITY): Payer: Medicare Other | Attending: Cardiology

## 2021-12-07 DIAGNOSIS — I10 Essential (primary) hypertension: Secondary | ICD-10-CM

## 2021-12-07 DIAGNOSIS — I350 Nonrheumatic aortic (valve) stenosis: Secondary | ICD-10-CM | POA: Diagnosis not present

## 2021-12-07 LAB — ECHOCARDIOGRAM COMPLETE
AR max vel: 1.11 cm2
AV Area VTI: 1.12 cm2
AV Area mean vel: 1.22 cm2
AV Mean grad: 17.5 mmHg
AV Peak grad: 35.3 mmHg
Ao pk vel: 2.97 m/s
Area-P 1/2: 2.93 cm2
P 1/2 time: 501 msec
S' Lateral: 2.3 cm

## 2021-12-10 DIAGNOSIS — R262 Difficulty in walking, not elsewhere classified: Secondary | ICD-10-CM | POA: Diagnosis not present

## 2021-12-10 DIAGNOSIS — M5416 Radiculopathy, lumbar region: Secondary | ICD-10-CM | POA: Diagnosis not present

## 2021-12-10 DIAGNOSIS — M6281 Muscle weakness (generalized): Secondary | ICD-10-CM | POA: Diagnosis not present

## 2021-12-13 DIAGNOSIS — L4059 Other psoriatic arthropathy: Secondary | ICD-10-CM | POA: Diagnosis not present

## 2021-12-14 DIAGNOSIS — M5416 Radiculopathy, lumbar region: Secondary | ICD-10-CM | POA: Diagnosis not present

## 2021-12-14 DIAGNOSIS — M6281 Muscle weakness (generalized): Secondary | ICD-10-CM | POA: Diagnosis not present

## 2021-12-14 DIAGNOSIS — R262 Difficulty in walking, not elsewhere classified: Secondary | ICD-10-CM | POA: Diagnosis not present

## 2021-12-18 ENCOUNTER — Ambulatory Visit (INDEPENDENT_AMBULATORY_CARE_PROVIDER_SITE_OTHER): Payer: Medicare Other | Admitting: Cardiology

## 2021-12-18 ENCOUNTER — Encounter: Payer: Self-pay | Admitting: Cardiology

## 2021-12-18 DIAGNOSIS — I10 Essential (primary) hypertension: Secondary | ICD-10-CM

## 2021-12-18 DIAGNOSIS — G609 Hereditary and idiopathic neuropathy, unspecified: Secondary | ICD-10-CM | POA: Diagnosis not present

## 2021-12-18 DIAGNOSIS — I350 Nonrheumatic aortic (valve) stenosis: Secondary | ICD-10-CM | POA: Diagnosis not present

## 2021-12-18 DIAGNOSIS — Z8249 Family history of ischemic heart disease and other diseases of the circulatory system: Secondary | ICD-10-CM | POA: Diagnosis not present

## 2021-12-18 DIAGNOSIS — G629 Polyneuropathy, unspecified: Secondary | ICD-10-CM | POA: Insufficient documentation

## 2021-12-18 DIAGNOSIS — L4050 Arthropathic psoriasis, unspecified: Secondary | ICD-10-CM | POA: Diagnosis not present

## 2021-12-18 NOTE — Assessment & Plan Note (Addendum)
Continue with current medication management, amlodipine 5 mg a day, losartan hydrochlorothiazide 100/25 mg a day.  Judicious use of NSAIDs.  Blood pressure little bit elevated today.  She used to have a wrist cuff, this broke.  I have suggested an arm cuff for her.

## 2021-12-18 NOTE — Assessment & Plan Note (Signed)
Overall has some leg weakness, leg pain.  Continue to work with physical therapy type exercises.  Distal pulses were excellent.

## 2021-12-18 NOTE — Patient Instructions (Signed)
Medication Instructions:  The current medical regimen is effective;  continue present plan and medications.  *If you need a refill on your cardiac medications before your next appointment, please call your pharmacy*  Testing/Procedures: Your physician has requested that you have an echocardiogram in 1 year. Echocardiography is a painless test that uses sound waves to create images of your heart. It provides your doctor with information about the size and shape of your heart and how well your heart's chambers and valves are working. This procedure takes approximately one hour. There are no restrictions for this procedure.   Follow-Up: At [facility], you and your health needs are our priority.  As part of our continuing mission to provide you with exceptional heart care, we have created designated Provider Care Teams.  These Care Teams include your primary Cardiologist (physician) and Advanced Practice Providers (APPs -  Physician Assistants and Nurse Practitioners) who all work together to provide you with the care you need, when you need it.  We recommend signing up for the patient portal called "MyChart".  Sign up information is provided on this After Visit Summary.  MyChart is used to connect with patients for Virtual Visits (Telemedicine).  Patients are able to view lab/test results, encounter notes, upcoming appointments, etc.  Non-urgent messages can be sent to your provider as well.   To learn more about what you can do with MyChart, go to [email or web address].    Your next appointment:   1 year(s)  The format for your next appointment:   In Person  Provider:   Candee Furbish, MD {   Important Information About Sugar

## 2021-12-18 NOTE — Assessment & Plan Note (Addendum)
Echocardiogram 2023 shows mild to moderate aortic valve stenosis.  Not severe.  No indication for surgical or TAVR intervention at this time.  We will continue to monitor.  We will check echocardiogram next year.  Murmur on exam.

## 2021-12-18 NOTE — Assessment & Plan Note (Signed)
Her father, brother, 3 nephews all of had CAD.  Continue with aggressive secondary risk factor prevention.  She is on aspirin 81 mg every other day.

## 2021-12-18 NOTE — Progress Notes (Signed)
Cardiology Office Note:    Date:  12/18/2021   ID:  Lisa [name], DOB Jun 24, 1936, MRN [id number]  PCP:  Lawerance Cruel, MD   [facility] Cardiologist:  Candee Furbish, MD     Referring MD: No ref. provider found     History of Present Illness:    Lisa [name] is a 86 y.o. female here for follow-up of aortic stenosis, hypertension, and review of echocardiogram from 12/07/21.  She previously sustained a close fall where she grabbed onto the bed to break her fall.  Pulled several muscles.  Called EMS.  She did not have to go to the hospital.  She is also had a few dental caries treated.  Oral thrush treated.  Earlier last year had radioactive iodine thyroid ablation.  Her brother had CABG 20 years ago.   Today, she is accompanied with her nefew today, he is a patient of Dr. Percival Spanish.  she says she is doing well, but did not sleep well last night. She has been experiencing LE pain and weakness. She adds that she also has pain and weakness bilaterally in her thighs as well.   Her blood pressure was elevated today.   She excludes salt from her diet, but also adds that she's gained weight.   She has been experiencing fatigue and back pain, and been doing  leg exercises to manage her LE discomfort.   She remains compliant with the B-complex vitamins and folic acid supplements in addition to her blood pressure medications.   [name] reports that  heart problems are common in her family.   The patient denies shortness of breath, nocturnal dyspnea, orthopnea or peripheral edema.  There have been no palpitations, lightheadedness or syncope.  Complains of leg pain.   Past Medical History:  Diagnosis Date   Arthritis    DDD (degenerative disc disease), cervical    Deafness in right ear    Diverticulosis    Hearing loss in left ear    Heart murmur    Kidney stones    Psoriasis     Past Surgical History:  Procedure Laterality Date   APPENDECTOMY     BILATERAL  SALPINGOOPHORECTOMY  1996   CATARACT EXTRACTION     CHOLECYSTECTOMY  2002   COLONOSCOPY  2003/2009/2015   TOTAL ABDOMINAL HYSTERECTOMY  1979   FIBROIDS     Current Medications: Current Meds  Medication Sig   acetaminophen (TYLENOL) 325 MG tablet Take 650 mg by mouth every 6 (six) hours as needed for mild pain or moderate pain. 3/4 tabs daily if needed   amLODipine (NORVASC) 5 MG tablet Take 5 mg by mouth daily.    aspirin 81 MG tablet Take 81 mg by mouth every other day.   Calcium Carbonate-Vit D-Min (CALTRATE PLUS PO) Take 1 tablet by mouth 2 (two) times daily.    Cholecalciferol (VITAMIN D) 2000 units tablet Take 2,000 Units by mouth daily.   ciprofloxacin (CIPRO) 250 MG tablet SMARTSIG:1 Tablet(s) By Mouth Every 12 Hours   Cyanocobalamin (B-12) 1000 MCG CAPS Take by mouth daily.   fexofenadine (ALLEGRA) 180 MG tablet Take 180 mg by mouth daily.   fluticasone (FLONASE) 50 MCG/ACT nasal spray Place 1 spray into both nostrils daily.    folic acid (FOLVITE) 1 MG tablet Take 1 mg by mouth daily.    losartan-hydrochlorothiazide (HYZAAR) 100-25 MG tablet Take 1 tablet by mouth daily. Please make yearly appt with Dr. Marlou Porch for May 2023 for future refills.  Thank you 1st attempt   meloxicam (MOBIC) 15 MG tablet Take 15 mg by mouth daily.   methocarbamol (ROBAXIN) 500 MG tablet take 1 tablet by oral route 3 times per day prn   methotrexate (RHEUMATREX) 2.5 MG tablet Take 10 mg by mouth 2 (two) times a week.    Multiple Vitamins-Minerals (PRESERVISION AREDS) TABS Take 1 tablet by mouth daily.   nystatin (MYCOSTATIN) 100000 UNIT/ML suspension Take by mouth.   nystatin cream (MYCOSTATIN) APPLY TO AFFECTED AREA AS NEEDED FOR 7 DAYS   omeprazole (PRILOSEC) 20 MG capsule TAKE 1 CAPSULE BY MOUTH EVERY OTHER DAY   Polyethyl Glycol-Propyl Glycol (SYSTANE) 0.4-0.3 % SOLN See admin instructions.   predniSONE (STERAPRED UNI-PAK 48 TAB) 5 MG (48) TBPK tablet Take by mouth as directed.   sodium chloride  (OCEAN) 0.65 % SOLN nasal spray Place 1 spray into both nostrils as needed for congestion.   traMADol (ULTRAM) 50 MG tablet Take 50 mg by mouth every 8 (eight) hours as needed.   triamcinolone (KENALOG) 0.1 % Apply 1 application topically 2 (two) times daily.     Allergies:   Doxycycline, Lisinopril, and Penicillins   Social History   Socioeconomic History   Marital status: Single    Spouse name: Not on file   Number of children: Not on file   Years of education: Not on file   Highest education level: Not on file  Occupational History   Not on file  Tobacco Use   Smoking status: Former   Smokeless tobacco: Never  Vaping Use   Vaping Use: Never used  Substance and Sexual Activity   Alcohol use: Yes    Comment: WINE   Drug use: No   Sexual activity: Not on file  Other Topics Concern   Not on file  Social History Narrative   Not on file   Social Determinants of Health   Financial Resource Strain: Not on file  Food Insecurity: Not on file  Transportation Needs: Not on file  Physical Activity: Not on file  Stress: Not on file  Social Connections: Not on file     Family History: The patient's family history includes Goiter in her brother and mother; Heart disease in her brother, brother, father, mother, and paternal grandfather.  ROS:   Please see the history of present illness.   (+) LE Pain and weakness (+) Neuropathy in LE (+) Fatigue  All other systems reviewed and are negative.  EKGs/Labs/Other Studies Reviewed:    The following studies were reviewed today:  ECHO 12/07/2021:  IMPRESSIONS  1. Left ventricular ejection fraction, by estimation, is >75%. The left  ventricle has hyperdynamic function. The left ventricle has no regional  wall motion abnormalities. There is mild left ventricular hypertrophy.  Left ventricular diastolic parameters  are consistent with Grade I diastolic dysfunction (impaired relaxation).   2. Right ventricular systolic function is  normal. The right ventricular  size is normal.   3. Left atrial size was mildly dilated.   4. The mitral valve is normal in structure. Trivial mitral valve  regurgitation. No evidence of mitral stenosis.   5. The aortic valve is tricuspid. Aortic valve regurgitation is mild.  Mild to moderate aortic valve stenosis.   6. The inferior vena cava is normal in size with greater than 50%  respiratory variability, suggesting right atrial pressure of 3 mmHg.    ECHO 01/11/2020:  1. Hyperdynamic LV systolic function; mild LVH; grade 1 diastolic  dysfunction; calcified aortic valve with  mild AS (mean gradient 14 mmHg)  and mild AI; mild MR; mild LAE.   2. Left ventricular ejection fraction, by estimation, is >75%. The left  ventricle has hyperdynamic function. The left ventricle has no regional  wall motion abnormalities. There is mild left ventricular hypertrophy.  Left ventricular diastolic parameters  are consistent with Grade I diastolic dysfunction (impaired relaxation).   3. Right ventricular systolic function is normal. The right ventricular  size is normal.   4. Left atrial size was mildly dilated.   5. The mitral valve is normal in structure. No evidence of mitral valve  regurgitation. No evidence of mitral stenosis.   6. The aortic valve is tricuspid. Aortic valve regurgitation is mild.  Mild aortic valve stenosis.   7. The inferior vena cava is normal in size with greater than 50%  respiratory variability, suggesting right atrial pressure of 3 mmHg.    ECHO 01/01/2018: Study Conclusions   - Left ventricle: The cavity size was normal. There was mild    concentric hypertrophy. Systolic function was vigorous. The    estimated ejection fraction was in the range of 65% to 70%. Wall    motion was normal; there were no regional wall motion    abnormalities. Doppler parameters are consistent with abnormal    left ventricular relaxation (grade 1 diastolic dysfunction).    There was no  evidence of elevated ventricular filling pressure by    Doppler parameters.  - Aortic valve: Trileaflet; moderately thickened, moderately    calcified leaflets. There was mild stenosis. Mean gradient (S):    12 mm Hg. Peak gradient (S): 25 mm Hg. Valve area (VTI): 2.48    cm^2. Valve area (Vmax): 2.17 cm^2. Valve area (Vmean): 2.31    cm^2.  - Mitral valve: Calcified annulus. Mildly thickened leaflets .  - Left atrium: The atrium was mildly dilated.  - Right ventricle: Systolic function was normal.  - Right atrium: The atrium was normal in size.  - Pulmonic valve: There was no regurgitation.  - Pulmonary arteries: Systolic pressure was within the normal    range.  - Inferior vena cava: The vessel was normal in size.  - Pericardium, extracardiac: There was no pericardial effusion.     EKG: EKG is personally reviewed.   12/18/2021 EKG: Rate 61. Sinus Rhythm, inferior infarction pattern.  12/11/2020 EKG: Sinus rhythm 72 with sinus arrhythmia. Sinus rhythm 62 on 12/16/2019.  Recent Labs: No results found for requested labs within last 8760 hours.  Recent Lipid Panel No results found for: CHOL, TRIG, HDL, CHOLHDL, VLDL, LDLCALC, LDLDIRECT   Risk Assessment/Calculations:      Physical Exam:    VS:  BP (!) 167/68 (BP Location: Left Arm, Patient Position: Sitting, Cuff Size: Normal)   Pulse 61   Ht 5' (1.524 m)   Wt 194 lb (88 kg)   LMP  (LMP Unknown)   BMI 37.89 kg/m     Wt Readings from Last 3 Encounters:  12/18/21 194 lb (88 kg)  12/11/20 189 lb (85.7 kg)  12/16/19 192 lb 6.4 oz (87.3 kg)     GEN:  Well nourished, well developed in no acute distress HEENT: Normal NECK: No JVD; radiation carotid bruits LYMPHATICS: No lymphadenopathy CARDIAC: RRR, 3/6 SM RUSB, no rubs, gallops RESPIRATORY:  Clear to auscultation without rales, wheezing or rhonchi  ABDOMEN: Soft, non-tender, non-distended MUSCULOSKELETAL:  No edema; No deformity  SKIN: Warm and dry NEUROLOGIC:  Alert  and oriented x 3 PSYCHIATRIC:  Normal  affect   ASSESSMENT:    1. Nonrheumatic aortic valve stenosis   2. Essential hypertension, benign   3. Family history of coronary artery disease   4. Psoriatic arthritis ([place])   5. Idiopathic peripheral neuropathy     PLAN:    In order of problems listed above:  Aortic stenosis Echocardiogram 2023 shows mild to moderate aortic valve stenosis.  Not severe.  No indication for surgical or TAVR intervention at this time.  We will continue to monitor.  We will check echocardiogram next year.  Murmur on exam.  Essential hypertension, benign Continue with current medication management, amlodipine 5 mg a day, losartan hydrochlorothiazide 100/25 mg a day.  Judicious use of NSAIDs.  Blood pressure little bit elevated today.  She used to have a wrist cuff, this broke.  I have suggested an arm cuff for her.  Family history of coronary artery disease  Her father, brother, 3 nephews all of had CAD.  Continue with aggressive secondary risk factor prevention.  She is on aspirin 81 mg every other day.  Psoriatic arthritis ([place]) Has taken methotrexate.  Aspirin every other day.  Had thrush treated.  Peripheral neuropathy Overall has some leg weakness, leg pain.  Continue to work with physical therapy type exercises.  Distal pulses were excellent.     Medication Adjustments/Labs and Tests Ordered: Current medicines are reviewed at length with the patient today.  Concerns regarding medicines are outlined above.  Orders Placed This Encounter  Procedures   EKG 12-Lead   ECHOCARDIOGRAM COMPLETE   No orders of the defined types were placed in this encounter.   Patient Instructions  Medication Instructions:  The current medical regimen is effective;  continue present plan and medications.  *If you need a refill on your cardiac medications before your next appointment, please call your pharmacy*  Testing/Procedures: Your physician has requested that you  have an echocardiogram in 1 year. Echocardiography is a painless test that uses sound waves to create images of your heart. It provides your doctor with information about the size and shape of your heart and how well your heart's chambers and valves are working. This procedure takes approximately one hour. There are no restrictions for this procedure.   Follow-Up: At [facility], you and your health needs are our priority.  As part of our continuing mission to provide you with exceptional heart care, we have created designated Provider Care Teams.  These Care Teams include your primary Cardiologist (physician) and Advanced Practice Providers (APPs -  Physician Assistants and Nurse Practitioners) who all work together to provide you with the care you need, when you need it.  We recommend signing up for the patient portal called "MyChart".  Sign up information is provided on this After Visit Summary.  MyChart is used to connect with patients for Virtual Visits (Telemedicine).  Patients are able to view lab/test results, encounter notes, upcoming appointments, etc.  Non-urgent messages can be sent to your provider as well.   To learn more about what you can do with MyChart, go to [email or web address].    Your next appointment:   1 year(s)  The format for your next appointment:   In Person  Provider:   Candee Furbish, MD {   Important Information About Sugar         F/U in 1 year  I,Tinashe Williams,acting as a scribe for Candee Furbish, MD.,have documented all relevant documentation on the behalf of Candee Furbish, MD,as directed by  Candee Furbish, MD  while in the presence of Candee Furbish, MD.   I, Candee Furbish, MD, have reviewed all documentation for this visit. The documentation on 12/18/21 for the exam, diagnosis, procedures, and orders are all accurate and complete.

## 2021-12-18 NOTE — Assessment & Plan Note (Signed)
Has taken methotrexate.  Aspirin every other day.  Had thrush treated.

## 2021-12-20 DIAGNOSIS — M5416 Radiculopathy, lumbar region: Secondary | ICD-10-CM | POA: Diagnosis not present

## 2021-12-20 DIAGNOSIS — M6281 Muscle weakness (generalized): Secondary | ICD-10-CM | POA: Diagnosis not present

## 2021-12-20 DIAGNOSIS — R262 Difficulty in walking, not elsewhere classified: Secondary | ICD-10-CM | POA: Diagnosis not present

## 2022-01-04 DIAGNOSIS — M47894 Other spondylosis, thoracic region: Secondary | ICD-10-CM | POA: Diagnosis not present

## 2022-01-04 DIAGNOSIS — M47816 Spondylosis without myelopathy or radiculopathy, lumbar region: Secondary | ICD-10-CM | POA: Diagnosis not present

## 2022-02-06 DIAGNOSIS — M47816 Spondylosis without myelopathy or radiculopathy, lumbar region: Secondary | ICD-10-CM | POA: Diagnosis not present

## 2022-02-07 ENCOUNTER — Other Ambulatory Visit: Payer: Self-pay | Admitting: Orthopaedic Surgery

## 2022-02-07 DIAGNOSIS — M546 Pain in thoracic spine: Secondary | ICD-10-CM

## 2022-02-07 DIAGNOSIS — M545 Low back pain, unspecified: Secondary | ICD-10-CM

## 2022-02-13 DIAGNOSIS — Z0000 Encounter for general adult medical examination without abnormal findings: Secondary | ICD-10-CM | POA: Diagnosis not present

## 2022-02-13 DIAGNOSIS — Z1389 Encounter for screening for other disorder: Secondary | ICD-10-CM | POA: Diagnosis not present

## 2022-02-14 DIAGNOSIS — R350 Frequency of micturition: Secondary | ICD-10-CM | POA: Diagnosis not present

## 2022-02-18 ENCOUNTER — Ambulatory Visit
Admission: RE | Admit: 2022-02-18 | Discharge: 2022-02-18 | Disposition: A | Discharge status: Home / Self Care | Payer: Medicare Other | Source: Ambulatory Visit | Attending: Orthopaedic Surgery | Admitting: Orthopaedic Surgery

## 2022-02-18 DIAGNOSIS — M545 Low back pain, unspecified: Secondary | ICD-10-CM

## 2022-02-18 DIAGNOSIS — M40204 Unspecified kyphosis, thoracic region: Secondary | ICD-10-CM | POA: Diagnosis not present

## 2022-02-18 DIAGNOSIS — M549 Dorsalgia, unspecified: Secondary | ICD-10-CM | POA: Diagnosis not present

## 2022-02-18 DIAGNOSIS — M48061 Spinal stenosis, lumbar region without neurogenic claudication: Secondary | ICD-10-CM | POA: Diagnosis not present

## 2022-02-18 DIAGNOSIS — M546 Pain in thoracic spine: Secondary | ICD-10-CM

## 2022-03-04 DIAGNOSIS — M47816 Spondylosis without myelopathy or radiculopathy, lumbar region: Secondary | ICD-10-CM | POA: Diagnosis not present

## 2022-03-04 DIAGNOSIS — R531 Weakness: Secondary | ICD-10-CM | POA: Diagnosis not present

## 2022-03-08 ENCOUNTER — Other Ambulatory Visit: Payer: Self-pay | Admitting: Internal Medicine

## 2022-03-08 DIAGNOSIS — Z923 Personal history of irradiation: Secondary | ICD-10-CM | POA: Diagnosis not present

## 2022-03-08 DIAGNOSIS — E0520 Thyrotoxicosis with toxic multinodular goiter without thyrotoxic crisis or storm: Secondary | ICD-10-CM | POA: Diagnosis not present

## 2022-03-08 DIAGNOSIS — J398 Other specified diseases of upper respiratory tract: Secondary | ICD-10-CM

## 2022-03-08 DIAGNOSIS — R490 Dysphonia: Secondary | ICD-10-CM | POA: Diagnosis not present

## 2022-03-08 DIAGNOSIS — R1310 Dysphagia, unspecified: Secondary | ICD-10-CM | POA: Diagnosis not present

## 2022-03-08 DIAGNOSIS — R682 Dry mouth, unspecified: Secondary | ICD-10-CM | POA: Diagnosis not present

## 2022-03-11 ENCOUNTER — Ambulatory Visit
Admission: RE | Admit: 2022-03-11 | Discharge: 2022-03-11 | Disposition: A | Discharge status: Home / Self Care | Payer: Medicare Other | Source: Ambulatory Visit | Attending: Internal Medicine | Admitting: Internal Medicine

## 2022-03-11 DIAGNOSIS — J398 Other specified diseases of upper respiratory tract: Secondary | ICD-10-CM

## 2022-03-11 DIAGNOSIS — E0520 Thyrotoxicosis with toxic multinodular goiter without thyrotoxic crisis or storm: Secondary | ICD-10-CM

## 2022-03-11 DIAGNOSIS — E042 Nontoxic multinodular goiter: Secondary | ICD-10-CM | POA: Diagnosis not present

## 2022-03-12 DIAGNOSIS — M47895 Other spondylosis, thoracolumbar region: Secondary | ICD-10-CM | POA: Diagnosis not present

## 2022-03-12 DIAGNOSIS — M6281 Muscle weakness (generalized): Secondary | ICD-10-CM | POA: Diagnosis not present

## 2022-03-12 DIAGNOSIS — R262 Difficulty in walking, not elsewhere classified: Secondary | ICD-10-CM | POA: Diagnosis not present

## 2022-03-14 ENCOUNTER — Other Ambulatory Visit: Payer: Self-pay | Admitting: Cardiology

## 2022-03-15 DIAGNOSIS — R682 Dry mouth, unspecified: Secondary | ICD-10-CM | POA: Diagnosis not present

## 2022-03-15 DIAGNOSIS — Z923 Personal history of irradiation: Secondary | ICD-10-CM | POA: Diagnosis not present

## 2022-03-15 DIAGNOSIS — R490 Dysphonia: Secondary | ICD-10-CM | POA: Diagnosis not present

## 2022-03-15 DIAGNOSIS — E0520 Thyrotoxicosis with toxic multinodular goiter without thyrotoxic crisis or storm: Secondary | ICD-10-CM | POA: Diagnosis not present

## 2022-03-15 DIAGNOSIS — R1310 Dysphagia, unspecified: Secondary | ICD-10-CM | POA: Diagnosis not present

## 2022-03-18 ENCOUNTER — Other Ambulatory Visit: Payer: Self-pay | Admitting: Internal Medicine

## 2022-03-18 DIAGNOSIS — R5382 Chronic fatigue, unspecified: Secondary | ICD-10-CM | POA: Diagnosis not present

## 2022-03-18 DIAGNOSIS — E0520 Thyrotoxicosis with toxic multinodular goiter without thyrotoxic crisis or storm: Secondary | ICD-10-CM

## 2022-03-18 DIAGNOSIS — M810 Age-related osteoporosis without current pathological fracture: Secondary | ICD-10-CM | POA: Diagnosis not present

## 2022-03-18 DIAGNOSIS — M5136 Other intervertebral disc degeneration, lumbar region: Secondary | ICD-10-CM | POA: Diagnosis not present

## 2022-03-18 DIAGNOSIS — R7989 Other specified abnormal findings of blood chemistry: Secondary | ICD-10-CM | POA: Diagnosis not present

## 2022-03-18 DIAGNOSIS — E669 Obesity, unspecified: Secondary | ICD-10-CM | POA: Diagnosis not present

## 2022-03-18 DIAGNOSIS — E041 Nontoxic single thyroid nodule: Secondary | ICD-10-CM | POA: Diagnosis not present

## 2022-03-18 DIAGNOSIS — M150 Primary generalized (osteo)arthritis: Secondary | ICD-10-CM | POA: Diagnosis not present

## 2022-03-18 DIAGNOSIS — L4059 Other psoriatic arthropathy: Secondary | ICD-10-CM | POA: Diagnosis not present

## 2022-03-18 DIAGNOSIS — Z6837 Body mass index (BMI) 37.0-37.9, adult: Secondary | ICD-10-CM | POA: Diagnosis not present

## 2022-03-19 ENCOUNTER — Other Ambulatory Visit: Payer: Self-pay | Admitting: Internal Medicine

## 2022-03-19 DIAGNOSIS — E0520 Thyrotoxicosis with toxic multinodular goiter without thyrotoxic crisis or storm: Secondary | ICD-10-CM

## 2022-03-22 ENCOUNTER — Other Ambulatory Visit (HOSPITAL_COMMUNITY)
Admission: RE | Admit: 2022-03-22 | Discharge: 2022-03-22 | Disposition: A | Discharge status: Home / Self Care | Payer: Medicare Other | Source: Ambulatory Visit | Attending: Diagnostic Radiology | Admitting: Diagnostic Radiology

## 2022-03-22 ENCOUNTER — Ambulatory Visit
Admission: RE | Admit: 2022-03-22 | Discharge: 2022-03-22 | Disposition: A | Discharge status: Home / Self Care | Payer: Medicare Other | Source: Ambulatory Visit | Attending: Internal Medicine | Admitting: Internal Medicine

## 2022-03-22 DIAGNOSIS — H43812 Vitreous degeneration, left eye: Secondary | ICD-10-CM | POA: Diagnosis not present

## 2022-03-22 DIAGNOSIS — E119 Type 2 diabetes mellitus without complications: Secondary | ICD-10-CM | POA: Diagnosis not present

## 2022-03-22 DIAGNOSIS — E0520 Thyrotoxicosis with toxic multinodular goiter without thyrotoxic crisis or storm: Secondary | ICD-10-CM | POA: Diagnosis not present

## 2022-03-22 DIAGNOSIS — E041 Nontoxic single thyroid nodule: Secondary | ICD-10-CM | POA: Diagnosis not present

## 2022-03-22 DIAGNOSIS — H43811 Vitreous degeneration, right eye: Secondary | ICD-10-CM | POA: Diagnosis not present

## 2022-03-22 DIAGNOSIS — H353133 Nonexudative age-related macular degeneration, bilateral, advanced atrophic without subfoveal involvement: Secondary | ICD-10-CM | POA: Diagnosis not present

## 2022-03-22 DIAGNOSIS — H353132 Nonexudative age-related macular degeneration, bilateral, intermediate dry stage: Secondary | ICD-10-CM | POA: Diagnosis not present

## 2022-03-27 LAB — CYTOLOGY - NON PAP

## 2022-04-04 ENCOUNTER — Ambulatory Visit (INDEPENDENT_AMBULATORY_CARE_PROVIDER_SITE_OTHER): Payer: Medicare Other | Admitting: Ophthalmology

## 2022-04-04 ENCOUNTER — Encounter (INDEPENDENT_AMBULATORY_CARE_PROVIDER_SITE_OTHER): Payer: Self-pay | Admitting: Ophthalmology

## 2022-04-04 DIAGNOSIS — E119 Type 2 diabetes mellitus without complications: Secondary | ICD-10-CM | POA: Diagnosis not present

## 2022-04-04 DIAGNOSIS — H43811 Vitreous degeneration, right eye: Secondary | ICD-10-CM

## 2022-04-04 DIAGNOSIS — H43812 Vitreous degeneration, left eye: Secondary | ICD-10-CM | POA: Diagnosis not present

## 2022-04-04 DIAGNOSIS — H353132 Nonexudative age-related macular degeneration, bilateral, intermediate dry stage: Secondary | ICD-10-CM | POA: Diagnosis not present

## 2022-04-04 DIAGNOSIS — H353133 Nonexudative age-related macular degeneration, bilateral, advanced atrophic without subfoveal involvement: Secondary | ICD-10-CM | POA: Insufficient documentation

## 2022-04-04 NOTE — Assessment & Plan Note (Signed)
No detectable DR

## 2022-04-04 NOTE — Progress Notes (Signed)
04/04/2022     CHIEF COMPLAINT Patient presents for  Chief Complaint  Patient presents with   Macular Degeneration    No large interval change in the quality of vision over time  HISTORY OF PRESENT ILLNESS: Lisa [name] is a 86 y.o. female who presents to the clinic today for:   HPI   1 YR FU OU OCT FP. Pt stated "I did have my eyes checked and I had to get a new pair of glasses but I haven't gotten them yet.  But I feel like sometimes particularly my right eye, I don't see as well. I notice this change first of this year. It seems to be more mucous in the corner of my eye. It seems both of my eyes are slightly blurry but mostly my right." Pt had a question about thyroid affecting the eyes. Pt stated she is pre diabetic. Pt is not taking any medications. Pt last A1C was 9.7 back in July.    Last edited by Silvestre Moment on 04/04/2022  1:39 PM.      Referring physician: Leighton Ruff, MD [place],  [place] [id number]  HISTORICAL INFORMATION:   Selected notes from the MEDICAL RECORD NUMBER       CURRENT MEDICATIONS: Current Outpatient Medications (Ophthalmic Drugs)  Medication Sig   Polyethyl Glycol-Propyl Glycol (SYSTANE) 0.4-0.3 % SOLN See admin instructions.   No current facility-administered medications for this visit. (Ophthalmic Drugs)   Current Outpatient Medications (Other)  Medication Sig   acetaminophen (TYLENOL) 325 MG tablet Take 650 mg by mouth every 6 (six) hours as needed for mild pain or moderate pain. 3/4 tabs daily if needed   amLODipine (NORVASC) 5 MG tablet Take 5 mg by mouth daily.    aspirin 81 MG tablet Take 81 mg by mouth every other day.   Calcium Carbonate-Vit D-Min (CALTRATE PLUS PO) Take 1 tablet by mouth 2 (two) times daily.    Cholecalciferol (VITAMIN D) 2000 units tablet Take 2,000 Units by mouth daily.   ciprofloxacin (CIPRO) 250 MG tablet SMARTSIG:1 Tablet(s) By Mouth Every 12 Hours   Cyanocobalamin (B-12) 1000 MCG CAPS Take  by mouth daily.   fexofenadine (ALLEGRA) 180 MG tablet Take 180 mg by mouth daily.   fluticasone (FLONASE) 50 MCG/ACT nasal spray Place 1 spray into both nostrils daily.    folic acid (FOLVITE) 1 MG tablet Take 1 mg by mouth daily.    losartan-hydrochlorothiazide (HYZAAR) 100-25 MG tablet TAKE 1 TABLET BY MOUTH EVERY DAY   meloxicam (MOBIC) 15 MG tablet Take 15 mg by mouth daily.   methocarbamol (ROBAXIN) 500 MG tablet take 1 tablet by oral route 3 times per day prn   methotrexate (RHEUMATREX) 2.5 MG tablet Take 10 mg by mouth 2 (two) times a week.    Multiple Vitamins-Minerals (PRESERVISION AREDS) TABS Take 1 tablet by mouth daily.   nystatin (MYCOSTATIN) 100000 UNIT/ML suspension Take by mouth.   nystatin cream (MYCOSTATIN) APPLY TO AFFECTED AREA AS NEEDED FOR 7 DAYS   omeprazole (PRILOSEC) 20 MG capsule TAKE 1 CAPSULE BY MOUTH EVERY OTHER DAY   predniSONE (STERAPRED UNI-PAK 48 TAB) 5 MG (48) TBPK tablet Take by mouth as directed.   sodium chloride (OCEAN) 0.65 % SOLN nasal spray Place 1 spray into both nostrils as needed for congestion.   traMADol (ULTRAM) 50 MG tablet Take 50 mg by mouth every 8 (eight) hours as needed.   triamcinolone (KENALOG) 0.1 % Apply 1 application topically 2 (  two) times daily.   No current facility-administered medications for this visit. (Other)      REVIEW OF SYSTEMS: ROS   Negative for: Constitutional, Gastrointestinal, Neurological, Skin, Genitourinary, Musculoskeletal, HENT, Endocrine, Cardiovascular, Eyes, Respiratory, Psychiatric, Allergic/Imm, Heme/Lymph Last edited by Silvestre Moment on 04/04/2022  1:31 PM.       ALLERGIES Allergies  Allergen Reactions   Doxycycline    Lisinopril     Cough    Penicillins     whelp    PAST MEDICAL HISTORY Past Medical History:  Diagnosis Date   Arthritis    DDD (degenerative disc disease), cervical    Deafness in right ear    Diverticulosis    Hearing loss in left ear    Heart murmur    Kidney stones     Psoriasis    Past Surgical History:  Procedure Laterality Date   APPENDECTOMY     BILATERAL SALPINGOOPHORECTOMY  1996   CATARACT EXTRACTION     CHOLECYSTECTOMY  2002   COLONOSCOPY  2003/2009/2015   TOTAL ABDOMINAL HYSTERECTOMY  1979   FIBROIDS     FAMILY HISTORY Family History  Problem Relation Age of Onset   Heart disease Mother    Goiter Mother    Heart disease Father    Heart disease Brother    Goiter Brother    Heart disease Paternal Grandfather    Heart disease Brother     SOCIAL HISTORY Social History   Tobacco Use   Smoking status: Former   Smokeless tobacco: Never  Scientific laboratory technician Use: Never used  Substance Use Topics   Alcohol use: Yes    Comment: WINE   Drug use: No         OPHTHALMIC EXAM:  Base Eye Exam     Visual Acuity (ETDRS)       Right Left   Dist cc 20/40 -2 +2 20/30 +1   Dist ph cc NI NI    Correction: Glasses         Tonometry (Tonopen, 1:37 PM)       Right Left   Pressure 31 23         Pupils       Pupils APD   Right PERRL None   Left PERRL None         Visual Fields       Left Right    Full Full         Extraocular Movement       Right Left    Full Full         Neuro/Psych     Oriented x3: Yes   Mood/Affect: Normal         Dilation     Both eyes: 1.0% Mydriacyl, 2.5% Phenylephrine @ 1:37 PM           Slit Lamp and Fundus Exam     External Exam       Right Left   External Normal Normal         Slit Lamp Exam       Right Left   Lids/Lashes Normal Normal   Conjunctiva/Sclera White and quiet White and quiet   Cornea Clear Clear   Anterior Chamber Deep and quiet Deep and quiet   Iris Round and reactive Round and reactive   Lens Posterior chamber intraocular lens Posterior chamber intraocular lens   Anterior Vitreous Normal Normal         Fundus Exam  Right Left   Posterior Vitreous Posterior vitreous detachment Posterior vitreous detachment   Disc Normal  Normal   C/D Ratio 0.35 0.35   Macula Intermediate age related macular degeneration, Pigmented atrophy, no exudates, no macular thickening, Geographic atrophy Intermediate age related macular degeneration, Pigmented atrophy, no exudates, no macular thickening, Geographic atrophy   Vessels no DR no DR   Periphery Normal Normal            IMAGING AND PROCEDURES  Imaging and Procedures for 04/04/22  OCT, Retina - OU - Both Eyes       Right Eye Quality was good. Scan locations included subfoveal. Central Foveal Thickness: 238. Findings include abnormal foveal contour, retinal drusen , outer retinal atrophy.   Left Eye Quality was good. Scan locations included subfoveal. Central Foveal Thickness: 259. Progression has been stable. Findings include abnormal foveal contour, retinal drusen , outer retinal atrophy.   Notes No signs of active CNVM OU, will observe     Color Fundus Photography Optos - OU - Both Eyes       Right Eye Disc findings include normal observations. Macula : geographic atrophy. Vessels : normal observations. Periphery : normal observations.   Left Eye Progression has been stable. Disc findings include normal observations. Macula : drusen, geographic atrophy. Periphery : normal observations.              ASSESSMENT/PLAN:  Posterior vitreous detachment of right eye Physiologic no holes or tears  Posterior vitreous detachment of left eye Physiologic no holes or tears  Intermediate stage nonexudative age-related macular degeneration of both eyes Stable OD and OS with progression of geographic atrophy outside of the foveal  Advanced nonexudative age-related macular degeneration of both eyes without subfoveal involvement Stable over time  Diabetes ([place]) No detectable DR     ICD-10-CM   1. Intermediate stage nonexudative age-related macular degeneration of both eyes  H35.3132 OCT, Retina - OU - Both Eyes    Color Fundus Photography Optos - OU -  Both Eyes    2. Posterior vitreous detachment of right eye  H43.811     3. Posterior vitreous detachment of left eye  H43.812     4. Advanced nonexudative age-related macular degeneration of both eyes without subfoveal involvement  H35.3133     5. Type 2 diabetes mellitus without complication, without long-term current use of insulin (HCC)  E11.9       1.  No detectable diabetic retinopathy  2.  No signs of wet AMD OU.  Stable acuity  3.  Ophthalmic Meds Ordered this visit:  No orders of the defined types were placed in this encounter.      Return in about 1 year (around 04/05/2023) for DILATE OU, COLOR FP, OCT.  There are no Patient Instructions on file for this visit.   Explained the diagnoses, plan, and follow up with the patient and they expressed understanding.  Patient expressed understanding of the importance of proper follow up care.   Clent Demark Amenda Duclos M.D. Diseases & Surgery of the Retina and Vitreous [facility] [place] 04/04/22     Abbreviations: M myopia (nearsighted); A astigmatism; H hyperopia (farsighted); P presbyopia; Mrx spectacle prescription;  CTL contact lenses; OD right eye; OS left eye; OU both eyes  XT exotropia; ET esotropia; PEK punctate epithelial keratitis; PEE punctate epithelial erosions; DES dry eye syndrome; MGD meibomian gland dysfunction; ATs artificial tears; PFAT's preservative free artificial tears; [place] nuclear sclerotic cataract; PSC posterior subcapsular cataract; ERM  epi-retinal membrane; PVD posterior vitreous detachment; RD retinal detachment; DM diabetes mellitus; DR diabetic retinopathy; NPDR non-proliferative diabetic retinopathy; PDR proliferative diabetic retinopathy; CSME clinically significant macular edema; DME diabetic macular edema; dbh dot blot hemorrhages; CWS cotton wool spot; POAG primary open angle glaucoma; C/D cup-to-disc ratio; HVF humphrey visual field; GVF goldmann visual field; OCT optical coherence  tomography; IOP intraocular pressure; BRVO Branch retinal vein occlusion; CRVO central retinal vein occlusion; CRAO central retinal artery occlusion; BRAO branch retinal artery occlusion; RT retinal tear; SB scleral buckle; PPV pars plana vitrectomy; VH Vitreous hemorrhage; PRP panretinal laser photocoagulation; IVK intravitreal kenalog; VMT vitreomacular traction; MH Macular hole;  NVD neovascularization of the disc; NVE neovascularization elsewhere; AREDS age related eye disease study; ARMD age related macular degeneration; POAG primary open angle glaucoma; EBMD epithelial/anterior basement membrane dystrophy; ACIOL anterior chamber intraocular lens; IOL intraocular lens; PCIOL posterior chamber intraocular lens; Phaco/IOL phacoemulsification with intraocular lens placement; [place] photorefractive keratectomy; LASIK laser assisted in situ keratomileusis; HTN hypertension; DM diabetes mellitus; COPD chronic obstructive pulmonary disease

## 2022-04-04 NOTE — Assessment & Plan Note (Signed)
Physiologic no holes or tears 

## 2022-04-04 NOTE — Assessment & Plan Note (Signed)
Stable over time. 

## 2022-04-04 NOTE — Assessment & Plan Note (Signed)
Stable OD and OS with progression of geographic atrophy outside of the foveal

## 2022-04-05 DIAGNOSIS — L4050 Arthropathic psoriasis, unspecified: Secondary | ICD-10-CM | POA: Diagnosis not present

## 2022-04-05 DIAGNOSIS — H90A22 Sensorineural hearing loss, unilateral, left ear, with restricted hearing on the contralateral side: Secondary | ICD-10-CM | POA: Diagnosis not present

## 2022-04-05 DIAGNOSIS — Z974 Presence of external hearing-aid: Secondary | ICD-10-CM | POA: Diagnosis not present

## 2022-04-05 DIAGNOSIS — Z87891 Personal history of nicotine dependence: Secondary | ICD-10-CM | POA: Diagnosis not present

## 2022-04-05 DIAGNOSIS — Z923 Personal history of irradiation: Secondary | ICD-10-CM | POA: Diagnosis not present

## 2022-04-05 DIAGNOSIS — L409 Psoriasis, unspecified: Secondary | ICD-10-CM | POA: Diagnosis not present

## 2022-04-05 DIAGNOSIS — E042 Nontoxic multinodular goiter: Secondary | ICD-10-CM | POA: Diagnosis not present

## 2022-04-05 DIAGNOSIS — Z79631 Long term (current) use of antimetabolite agent: Secondary | ICD-10-CM | POA: Diagnosis not present

## 2022-04-05 DIAGNOSIS — E1165 Type 2 diabetes mellitus with hyperglycemia: Secondary | ICD-10-CM | POA: Diagnosis not present

## 2022-04-05 DIAGNOSIS — M129 Arthropathy, unspecified: Secondary | ICD-10-CM | POA: Diagnosis not present

## 2022-04-05 DIAGNOSIS — H3530 Unspecified macular degeneration: Secondary | ICD-10-CM | POA: Diagnosis not present

## 2022-04-05 DIAGNOSIS — M1990 Unspecified osteoarthritis, unspecified site: Secondary | ICD-10-CM | POA: Diagnosis not present

## 2022-04-05 DIAGNOSIS — D497 Neoplasm of unspecified behavior of endocrine glands and other parts of nervous system: Secondary | ICD-10-CM | POA: Diagnosis not present

## 2022-04-05 DIAGNOSIS — H6531 Chronic mucoid otitis media, right ear: Secondary | ICD-10-CM | POA: Diagnosis not present

## 2022-04-05 DIAGNOSIS — H903 Sensorineural hearing loss, bilateral: Secondary | ICD-10-CM | POA: Diagnosis not present

## 2022-04-05 DIAGNOSIS — D447 Neoplasm of uncertain behavior of aortic body and other paraganglia: Secondary | ICD-10-CM | POA: Diagnosis not present

## 2022-04-16 DIAGNOSIS — E0520 Thyrotoxicosis with toxic multinodular goiter without thyrotoxic crisis or storm: Secondary | ICD-10-CM | POA: Diagnosis not present

## 2022-04-16 DIAGNOSIS — R682 Dry mouth, unspecified: Secondary | ICD-10-CM | POA: Diagnosis not present

## 2022-04-16 DIAGNOSIS — Z923 Personal history of irradiation: Secondary | ICD-10-CM | POA: Diagnosis not present

## 2022-04-25 DIAGNOSIS — Z23 Encounter for immunization: Secondary | ICD-10-CM | POA: Diagnosis not present

## 2022-04-30 ENCOUNTER — Ambulatory Visit (INDEPENDENT_AMBULATORY_CARE_PROVIDER_SITE_OTHER): Payer: Medicare Other | Admitting: Neurology

## 2022-04-30 ENCOUNTER — Telehealth: Payer: Self-pay | Admitting: Neurology

## 2022-04-30 ENCOUNTER — Encounter: Payer: Self-pay | Admitting: Neurology

## 2022-04-30 VITALS — BP 158/78 | HR 102 | Ht 60.0 in | Wt 197.0 lb

## 2022-04-30 DIAGNOSIS — L4050 Arthropathic psoriasis, unspecified: Secondary | ICD-10-CM | POA: Diagnosis not present

## 2022-04-30 DIAGNOSIS — R269 Unspecified abnormalities of gait and mobility: Secondary | ICD-10-CM | POA: Insufficient documentation

## 2022-04-30 NOTE — Telephone Encounter (Signed)
medicare/[employer] NPR sent to [facility] [phone] 

## 2022-04-30 NOTE — Progress Notes (Addendum)
Chief Complaint  Patient presents with   New Patient (Initial Visit)    Rm rm 15 alone. Pt reports bilateral leg weakness ( right is worse than the left). Sx started last fall. She has completed physical therapy but no benefit has been noted.       ASSESSMENT AND PLAN  MARELYN ROUSER is a 86 y.o. female    Worsening gait abnormality, urinary urgency, Psoriatec arthritis, Known history of lumbar degenerative changes, moderate L4-5 stenosis,  Evidence of mild to moderate bilateral ankle dorsiflexion weakness, length-dependent sensory changes, brisk reflex, bilateral Babinski signs, wide-based cautious stiff gait, with mild bilateral foot drop  MRI of cervical spine to rule out superimposed cervical myelopathy  Referral to physical therapy    DIAGNOSTIC DATA (LABS, IMAGING, TESTING) - I reviewed patient records, labs, notes, testing and imaging myself where available. Addendum: Neurosurgical evaluation by Dr. Deri Fuelling June 26, 2022, multilevel cervical degenerative changes with associated spondylosis mild to moderate spinal stenosis, no cord signal abnormality, nor there is any severe nerve root compression  MRI of lumbar, multilevel degenerative changes, severe right-sided lateral recess recess stenosis, severe compression of the right L4 nerve roots, evidence of significant spondylosis lateral recess stenosis right L4-5, foraminal narrowing on the right at L2-3, patient's symptoms are considered primarily from stenosis on the right side L3-4, L4-5, she has radicular symptoms with ongoing numbness and weakness, at risk of falling secondary to the pain and weakness, suggested right side L3-4 L4-5 decompressive laminectomy and foraminotomies, will proceed with the surgery  MEDICAL HISTORY:  SAPPHIRE TYGART is a 86 year old female, seen in request by  orthopedics Dr. Melrose Nakayama, for evaluation of gait abnormality, her primary care physician is Dr.Ross, Dwyane Luo, initial evaluation  was May 10, 2022  I reviewed and summarized the referring note. PMHX HTN Psoriatic arthritis,  Osteoporosis  She lives alone, has no children, some close relative is able to help her  Over the past few years, she noticed slow worsening gait abnormality, especially since 2022, she began to use a cane, and walker, holding onto furniture to walk at home, in addition, she complains of bilateral foot numbness, low back pain, occasionally radiating pain, more to the right side, worsening urinary urgency, incontinence, neck achy pain, radiating stiffness to bilateral shoulder, but not to bilateral hands  She suffered long history of psoriatic arthritis, deformity of bilateral finger joints  She was seen by orthopedic surgeon, over the past few months, had a physical therapy, steroid treatment, epidural injection, with only temporary improvement of her symptoms  I personally reviewed MRI of lumbar spine in July 2023, multilevel degenerative changes, moderate central canal stenosis at L4-5, with moderate subarticular stenosis on the right, variable degree of foraminal narrowing at other levels  MRI of thoracic spine, mild degenerative changes, no significant canal stenosis evidence of multiple thyroid nodules  She under went work-up for thyroid nodule, aspiration biopsy showed benign follicular nodule  She also complains of macular degeneration, under care of ophthalmologist, OU visual acuity 20/50, slow worsening hearing loss, wearing hearing aid, pending MRI of the brain with without contrast  She is concerned about her declining functional status," need to know it  all", to have a better plan for future   PHYSICAL EXAM:   Vitals:   04/30/22 0932  BP: (!) 158/78  Pulse: (!) 102  Weight: 197 lb (89.4 kg)  Height: 5' (1.524 m)   Not recorded     Body mass index is  38.47 kg/m.  PHYSICAL EXAMNIATION:  Gen: NAD, conversant, well nourised, well groomed                      Cardiovascular: Regular rate rhythm, no peripheral edema, warm, nontender. Eyes: Conjunctivae clear without exudates or hemorrhage Neck: Supple, no carotid bruits. Pulmonary: Clear to auscultation bilaterally   NEUROLOGICAL EXAM:  MENTAL STATUS: Speech/cognition: Awake, alert, oriented to history taking and casual conversation CRANIAL NERVES: CN II: Visual fields are full to confrontation. Pupils are round equal and briskly reactive to light. CN III, IV, VI: extraocular movement are normal. No ptosis. CN V: Facial sensation is intact to light touch CN VII: Face is symmetric with normal eye closure  CN VIII: Hearing is normal to causal conversation. CN IX, X: Phonation is normal. CN XI: Head turning and shoulder shrug are intact  MOTOR: Left upper extremity motor examination is limited due to left shoulder pain, deformity of bilateral hands joints, felt there was no significant bilateral upper extremity proximal and distal muscle weakness  Mild bilateral hip flexion weakness, mild to moderate bilateral ankle dorsiflexion weakness  REFLEXES: Reflexes are 1 and symmetric at the biceps, triceps, 2/2knees, and absent at ankles. Plantar responses are sensitive bilaterally  SENSORY: Length-dependent decreased light touch, pinprick, vibratory sensation to mid shin level  COORDINATION: There is no trunk or limb dysmetria noted.  GAIT/STANCE: Need push-up to get up from seated position, wide-based, unsteady, stiff, difficulty standing up on heels, tiptoe  REVIEW OF SYSTEMS:  Full 14 system review of systems performed and notable only for as above All other review of systems were negative.   ALLERGIES: Allergies  Allergen Reactions   Doxycycline    Lisinopril     Cough    Penicillins     whelp    HOME MEDICATIONS: Current Outpatient Medications  Medication Sig Dispense Refill   amLODipine (NORVASC) 5 MG tablet Take 5 mg by mouth daily.      Calcium Carbonate-Vit D-Min  (CALTRATE PLUS PO) Take 1 tablet by mouth 2 (two) times daily.      Cholecalciferol (VITAMIN D) 2000 units tablet Take 2,000 Units by mouth daily.     ciprofloxacin (CIPRO) 250 MG tablet SMARTSIG:1 Tablet(s) By Mouth Every 12 Hours     Cyanocobalamin (B-12) 1000 MCG CAPS Take by mouth daily.     fluticasone (FLONASE) 50 MCG/ACT nasal spray Place 1 spray into both nostrils daily.      folic acid (FOLVITE) 1 MG tablet Take 1 mg by mouth daily.      losartan-hydrochlorothiazide (HYZAAR) 100-25 MG tablet TAKE 1 TABLET BY MOUTH EVERY DAY 90 tablet 2   meloxicam (MOBIC) 15 MG tablet Take 15 mg by mouth daily.     methocarbamol (ROBAXIN) 500 MG tablet take 1 tablet by oral route 3 times per day prn     methotrexate (RHEUMATREX) 2.5 MG tablet Take 10 mg by mouth 2 (two) times a week.      Multiple Vitamins-Minerals (PRESERVISION AREDS) TABS Take 1 tablet by mouth daily.     nystatin (MYCOSTATIN) 100000 UNIT/ML suspension Take by mouth.     nystatin cream (MYCOSTATIN) APPLY TO AFFECTED AREA AS NEEDED FOR 7 DAYS     omeprazole (PRILOSEC) 20 MG capsule TAKE 1 CAPSULE BY MOUTH EVERY OTHER DAY     Polyethyl Glycol-Propyl Glycol (SYSTANE) 0.4-0.3 % SOLN See admin instructions.     predniSONE (STERAPRED UNI-PAK 48 TAB) 5 MG (48) TBPK tablet Take by mouth  as directed.     sodium chloride (OCEAN) 0.65 % SOLN nasal spray Place 1 spray into both nostrils as needed for congestion.     traMADol (ULTRAM) 50 MG tablet Take 50 mg by mouth every 8 (eight) hours as needed.     triamcinolone (KENALOG) 0.1 % Apply 1 application topically 2 (two) times daily.     No current facility-administered medications for this visit.    PAST MEDICAL HISTORY: Past Medical History:  Diagnosis Date   Arthritis    DDD (degenerative disc disease), cervical    Deafness in right ear    Diverticulosis    Hearing loss in left ear    Heart murmur    Kidney stones    Psoriasis     PAST SURGICAL HISTORY: Past Surgical History:   Procedure Laterality Date   APPENDECTOMY     BILATERAL SALPINGOOPHORECTOMY  07/22/1994   CATARACT EXTRACTION     CHOLECYSTECTOMY  07/22/2000   COLONOSCOPY  2003/2009/2015   gamma knife     TOTAL ABDOMINAL HYSTERECTOMY  07/22/1977   FIBROIDS     FAMILY HISTORY: Family History  Problem Relation Age of Onset   Heart disease Mother    Goiter Mother    Heart disease Father    Heart disease Brother    Goiter Brother    Heart disease Paternal Grandfather    Heart disease Brother     SOCIAL HISTORY: Social History   Socioeconomic History   Marital status: Single    Spouse name: Not on file   Number of children: Not on file   Years of education: Not on file   Highest education level: Not on file  Occupational History   Not on file  Tobacco Use   Smoking status: Former   Smokeless tobacco: Never  Vaping Use   Vaping Use: Never used  Substance and Sexual Activity   Alcohol use: Yes    Comment: WINE   Drug use: No   Sexual activity: Not on file  Other Topics Concern   Not on file  Social History Narrative   Not on file   Social Determinants of Health   Financial Resource Strain: Not on file  Food Insecurity: Not on file  Transportation Needs: Not on file  Physical Activity: Not on file  Stress: Not on file  Social Connections: Not on file  Intimate Partner Violence: Not on file      Marcial Pacas, M.D. Ph.D.  [facility] [address] [place], [place] [address] Ph: [phone] Fax: [phone]  CC:  Melrose Nakayama, MD [address] [place],  [place] [address]  Lawerance Cruel, MD

## 2022-05-07 ENCOUNTER — Ambulatory Visit
Admission: RE | Admit: 2022-05-07 | Discharge: 2022-05-07 | Disposition: A | Discharge status: Home / Self Care | Payer: Medicare Other | Source: Ambulatory Visit | Attending: Neurology | Admitting: Neurology

## 2022-05-07 DIAGNOSIS — R269 Unspecified abnormalities of gait and mobility: Secondary | ICD-10-CM | POA: Diagnosis not present

## 2022-05-07 DIAGNOSIS — L4050 Arthropathic psoriasis, unspecified: Secondary | ICD-10-CM

## 2022-05-08 ENCOUNTER — Telehealth: Payer: Self-pay | Admitting: Neurology

## 2022-05-08 DIAGNOSIS — M47812 Spondylosis without myelopathy or radiculopathy, cervical region: Secondary | ICD-10-CM

## 2022-05-08 DIAGNOSIS — R269 Unspecified abnormalities of gait and mobility: Secondary | ICD-10-CM

## 2022-05-08 NOTE — Telephone Encounter (Signed)
Please call patient MRI of the cervical spine showed multilevel degenerative changes, moderate spinal canal stenosis C4-5, C 5 6, with severe bilateral foraminal narrowing  This might contributed to her gait abnormality  We can arrange virtual visit if she has a lot of question about MRIs, if she desires, I can also refer her to neurosurgeon for evaluation especially if she complains of acute worsening,  Otherwise she can keep her previously scheduled follow-up visit with Janett Billow     IMPRESSION:    MRI cervical spine without contrast demonstrating: - At C4-5 disc bulging, facet and ligamentum flavum hypertrophy with moderate spinal stenosis and severe bilateral foraminal stenosis; no cord signal changes. - At C5-6 disc bulging and facet hypertrophy with moderate spinal stenosis and severe bilateral foraminal stenosis; no cord signal changes. - At C6-7 disc bulging and facet hypertrophy with mild spinal stenosis and severe bilateral foraminal stenosis. - At C3-4 disc bulging with severe right foraminal stenosis.

## 2022-05-09 ENCOUNTER — Ambulatory Visit: Payer: Medicare Other | Attending: Neurology | Admitting: Physical Therapy

## 2022-05-09 ENCOUNTER — Other Ambulatory Visit: Payer: Self-pay

## 2022-05-09 ENCOUNTER — Encounter: Payer: Self-pay | Admitting: Physical Therapy

## 2022-05-09 DIAGNOSIS — R269 Unspecified abnormalities of gait and mobility: Secondary | ICD-10-CM | POA: Diagnosis not present

## 2022-05-09 DIAGNOSIS — R2689 Other abnormalities of gait and mobility: Secondary | ICD-10-CM | POA: Insufficient documentation

## 2022-05-09 DIAGNOSIS — M6281 Muscle weakness (generalized): Secondary | ICD-10-CM | POA: Diagnosis not present

## 2022-05-09 DIAGNOSIS — L4050 Arthropathic psoriasis, unspecified: Secondary | ICD-10-CM | POA: Diagnosis not present

## 2022-05-09 DIAGNOSIS — R2681 Unsteadiness on feet: Secondary | ICD-10-CM | POA: Insufficient documentation

## 2022-05-09 NOTE — Therapy (Signed)
OUTPATIENT PHYSICAL THERAPY NEURO EVALUATION   Patient Name: Lisa [name] MRN: [id number] DOB:January 21, 1936, 86 y.o., female Today's Date: 05/09/2022   PCP: Lawerance Cruel, MD REFERRING PROVIDER: Marcial Pacas, MD   PT End of Session - 05/09/22 1401     Visit Number 1    Number of Visits 13    Date for PT Re-Evaluation 06/21/22    Authorization Type Medicare/[employer]    PT Start Time 1404    PT Stop Time 1446    PT Time Calculation (min) 42 min    Activity Tolerance Patient tolerated treatment well    Behavior During Therapy WFL for tasks assessed/performed             Past Medical History:  Diagnosis Date   Arthritis    DDD (degenerative disc disease), cervical    Deafness in right ear    Diverticulosis    Hearing loss in left ear    Heart murmur    Kidney stones    Psoriasis    Past Surgical History:  Procedure Laterality Date   APPENDECTOMY     BILATERAL SALPINGOOPHORECTOMY  07/22/1994   CATARACT EXTRACTION     CHOLECYSTECTOMY  07/22/2000   COLONOSCOPY  2003/2009/2015   gamma knife     TOTAL ABDOMINAL HYSTERECTOMY  07/22/1977   FIBROIDS    Patient Active Problem List   Diagnosis Date Noted   Gait abnormality 04/30/2022   Advanced nonexudative age-related macular degeneration of both eyes without subfoveal involvement 04/04/2022   Peripheral neuropathy 12/18/2021   Anxiety 09/27/2020   Aortic valve disorder 09/27/2020   Arthritis 09/27/2020   Hyperthyroidism 09/27/2020   Thyromegaly 09/27/2020   Body mass index (BMI) 36.0-36.9, adult 09/27/2020   History of radiation exposure 09/27/2020   Idiopathic kyphoscoliosis 09/27/2020   Macular degeneration 09/27/2020   Other seasonal allergic rhinitis 09/27/2020   Unsteady gait 09/27/2020   Arthritis, multiple joint involvement 08/21/2020   Atrophy of vagina 08/21/2020   Gastro-esophageal reflux disease without esophagitis 08/21/2020   Generalized anxiety disorder 08/21/2020   Mixed hyperlipidemia 08/21/2020    Osteoporosis 08/21/2020   Perennial allergic rhinitis with seasonal variation 08/21/2020   Psoriatic arthritis ([place]) 08/21/2020   Vitamin D deficiency 08/21/2020   Body mass index (BMI) 35.0-35.9, adult 05/08/2020   Lumbago with sciatica, right side 05/08/2020   Anterolisthesis of lumbar spine 05/08/2020   Other chronic pain 05/08/2020   Intermediate stage nonexudative age-related macular degeneration of both eyes 04/03/2020   Posterior vitreous detachment of right eye 04/03/2020   Posterior vitreous detachment of left eye 04/03/2020   Chronic secretory otitis media of right ear 09/23/2019   Sensorineural hearing loss (SNHL) of left ear with restricted hearing of right ear 06/09/2019   Multinodular goiter 11/07/2016   Neck pain 01/16/2015   Aortic stenosis 01/06/2014   Essential hypertension, benign 01/06/2014   Family history of coronary artery disease 01/06/2014   Diabetes ([place]) 01/06/2014   Chronic low back pain 07/05/2013   Displacement of lumbar intervertebral disc without myelopathy 07/05/2013   Lumbosacral radiculitis 07/05/2013   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  Can't walk but so far, careful with steps.  I get tired if I go too long of a distance.  Has worsened over the past several months.  R side is weaker than the other. Pt accompanied by: self  PERTINENT HISTORY:  See above; bulging disk in lumbar spine  PAIN:  Are you having pain? Yes: NPRS scale: 4/10 Pain location: low back, toes Pain description: soreness, sometimes sharp pain Aggravating factors: sitting or standing too long Relieving factors: exercises help  PRECAUTIONS: Fall  WEIGHT BEARING RESTRICTIONS No  FALLS: Has patient fallen in last 6 months? No Reports she has come close-R foot catches  LIVING ENVIRONMENT: Lives with: lives alone Lives in: House/apartment Stairs: Yes: Internal: 8, 5 steps; on right going up Has following equipment at home: Single point cane, Walker - 2 wheeled, Environmental consultant - 4 wheeled, and walkers were given to her; she doesn't typically use as they are too heavy to transport  PLOF: Independent, Independent with household mobility without device, and Independent with community mobility with device  PATIENT GOALS Pt's goals for therapy is to improve strength and avoid limiting my activities due to weakness  OBJECTIVE:   DIAGNOSTIC FINDINGS: MRI cervical spine (05/07/2022) - At C4-5 disc bulging, facet and ligamentum flavum hypertrophy with moderate spinal stenosis and severe bilateral foraminal stenosis; no cord signal changes. - At C5-6 disc bulging and facet hypertrophy with moderate spinal stenosis and severe bilateral foraminal stenosis; no cord signal changes. - At C6-7 disc bulging and facet hypertrophy with mild spinal stenosis and severe bilateral foraminal stenosis. - At C3-4 disc bulging with severe right foraminal stenosis. lumbar spine  Lumbar spine MRI 02/18/2022: 1. Mild central canal stenosis L1-2 with mild progression since September 2022 MRI 2. Mild central canal stenosis L2-3. Mild progression of right subarticular stenosis L2-3 3. Mild central canal stenosis L3-4 with mild progression. Moderate right subarticular stenosis with mild progression 4. Moderate central canal stenosis L4-5 with moderate subarticular stenosis on the right,  unchanged. 5. Mild anterolisthesis L5-S1 due to severe facet degeneration. No significant neural impingement.  COGNITION: Overall cognitive status: Within functional limits for tasks assessed   SENSATION: Light touch: WFL  EDEMA:  Noted BLEs-pt states this is not new  POSTURE: rounded shoulders and forward head  LOWER EXTREMITY ROM:    R hip flexion slightly decreased against gravity in sitting; -10 degrees R ankle dflex, +8 degrees L ankle dflex  Active  Right Eval Left Eval  Hip flexion    Hip extension    Hip abduction    Hip adduction    Hip internal rotation    Hip external rotation    Knee flexion    Knee extension    Ankle dorsiflexion    Ankle plantarflexion    Ankle inversion    Ankle eversion     (Blank rows = not tested)  LOWER EXTREMITY MMT:    MMT Right Eval Left Eval  Hip flexion 3+ 4  Hip extension    Hip abduction 4 4  Hip adduction 4 4  Hip internal rotation    Hip external rotation    Knee flexion 3+ 4  Knee extension 4 4  Ankle dorsiflexion 3- 3+  Ankle plantarflexion    Ankle inversion    Ankle eversion    (Blank rows = not tested)  TRANSFERS: Assistive device utilized:  able to stand without UE support, but uses backs  of legs at chair arms forward for momentum and None  Sit to stand: SBA Stand to sit: SBA  GAIT: Gait pattern: step through pattern, decreased step length- Right, decreased step length- Left, decreased stride length, decreased ankle dorsiflexion- Right, wide BOS, and poor foot clearance- Right Distance walked: 40 ft x 2 Assistive device utilized: Single point cane Level of assistance: SBA Comments: Decreased hip stability noted with gait-Trendelenburg pattern.  Brief trial of rollator-pt able to take longer strides  FUNCTIONAL TESTs:  5 times sit to stand: 34.78 sec with hands at knees Timed up and go (TUG): 32.94 sec Single limb stance:  < 1 sec each leg Tandem stance;  RLE posterior 11.4 sec, LLE posterior 13.5  sec  TODAY'S TREATMENT:  Initiated HEP-see below   PATIENT EDUCATION: Education details: PT POC, eval results, initial HEP Person educated: Patient Education method: Explanation, Demonstration, and Handouts Education comprehension: verbalized understanding, returned demonstration, and needs further education   HOME EXERCISE PROGRAM: Access Code: [email or web address] URL: [email or web address] Date: 05/09/2022 Prepared by: [facility]  Program Notes Using your rollator walker, walk about 2-3 minutes in your hallway at home, focusing on taking long strides and standing up tall.  Exercises - Staggered Stance Forward Backward Weight Shift with Counter Support  - 1 x daily - 5 x weekly - 1-2 sets - 10 reps - Standing Hip Abduction with Counter Support  - 1 x daily - 5 x weekly - 1-2 sets - 10 reps - Standing March with Counter Support  - 1 x daily - 5 x weekly - 1-2 sets - 10 reps    GOALS: Goals reviewed with patient? Yes  SHORT TERM GOALS: Target date: 06/07/2022  Pt will be independent with HEP for improved strength, balance, gait. Baseline: Goal status: INITIAL  2.  Pt will improve 5x sit<>stand to less than or equal to 25 sec to demonstrate improved functional strength and transfer efficiency.  Baseline: 34.78 sec Goal status: INITIAL  3.  Pt will improve TUG score to less than or equal to 25 sec for decreased fall risk. Baseline: 32.94 sec Goal status: INITIAL   LONG TERM GOALS: Target date: 06/21/2022  Pt will be independent with progression of HEP for improved strength, balance, gait.  Baseline:  Goal status: INITIAL  2.  Pt will improve 5x sit<>stand to less than or equal to 20 sec to demonstrate improved functional strength and transfer efficiency. Baseline: 34.78 sec Goal status: INITIAL  3.  Pt will improve TUG score to less than or equal to 20 sec for decreased fall risk. Baseline: 32.94 Goal status:  INITIAL  4.  Gait velocity score to be assessed, with goal to be written as appropriate.  Baseline:  Goal status: INITIAL  5.  2MWT to be assessed and goal to be written as appropriate. Baseline:  Goal status: INITIAL  ASSESSMENT:  CLINICAL IMPRESSION: Patient is an 86 y.o. female who was seen today for physical therapy evaluation and treatment for gait abnormality.   She presents with decreased lower extremity strength, decreased balance, decreased independence/stability with gait.  She presents with decreased step length, decreased SLS, decreased foot clearance, increased fall risk per TUG and FTSTS test.  She would benefit from skilled PT address the above stated deficits to decrease fall risk and improve overall functional mobility.  OBJECTIVE IMPAIRMENTS Abnormal gait, decreased balance, decreased mobility, difficulty walking, decreased ROM, decreased strength, and impaired flexibility.   ACTIVITY LIMITATIONS standing, transfers, and  locomotion level  PARTICIPATION LIMITATIONS: shopping, community activity, and church  PERSONAL FACTORS 3+ comorbidities: see above-note extensive lumbar/cervical involvement (has already had PT this year for lumbar PT)  are also affecting patient's functional outcome.   REHAB POTENTIAL: Good  CLINICAL DECISION MAKING: Evolving/moderate complexity  EVALUATION COMPLEXITY: Moderate  PLAN: PT FREQUENCY: 2x/week  PT DURATION: 6 weeks plus eval visit  PLANNED INTERVENTIONS: Therapeutic exercises, Therapeutic activity, Neuromuscular re-education, Balance training, Gait training, Patient/Family education, Self Care, Orthotic/Fit training, DME instructions, and Manual therapy  PLAN FOR NEXT SESSION: Trial foot-up brace/ AFO; review HEP and progress as able.  Discuss use of rollator for more stable gait.  Assess gait velocity and 2MWT   Cliffard Hair W., PT 05/09/2022, 3:59 PM  [facility] [address], [place], [place] [address] Phone # [phone] Fax # [phone]

## 2022-05-13 ENCOUNTER — Encounter: Payer: Self-pay | Admitting: Physical Therapy

## 2022-05-13 ENCOUNTER — Ambulatory Visit: Payer: Medicare Other | Admitting: Physical Therapy

## 2022-05-13 DIAGNOSIS — R2681 Unsteadiness on feet: Secondary | ICD-10-CM

## 2022-05-13 DIAGNOSIS — R2689 Other abnormalities of gait and mobility: Secondary | ICD-10-CM

## 2022-05-13 DIAGNOSIS — M6281 Muscle weakness (generalized): Secondary | ICD-10-CM

## 2022-05-13 DIAGNOSIS — L4050 Arthropathic psoriasis, unspecified: Secondary | ICD-10-CM | POA: Diagnosis not present

## 2022-05-13 DIAGNOSIS — R269 Unspecified abnormalities of gait and mobility: Secondary | ICD-10-CM | POA: Diagnosis not present

## 2022-05-13 NOTE — Telephone Encounter (Signed)
I spoke with the patient and provided MRI results. She would like to be evaluated by neurosurgery. She verbalized understanding and expressed appreciation for the call. All questions answered.

## 2022-05-13 NOTE — Telephone Encounter (Signed)
Pt is returning a call from nurse. Pt is requesting a call back from nurse.

## 2022-05-13 NOTE — Telephone Encounter (Signed)
I left a voice mail for a return call.

## 2022-05-13 NOTE — Therapy (Signed)
OUTPATIENT PHYSICAL THERAPY NEURO TREATMENT NOTE   Patient Name: Lisa [name] MRN: [id number] DOB:12/16/1935, 86 y.o., female Today's Date: 05/13/2022   PCP: Lawerance Cruel, MD REFERRING PROVIDER: Marcial Pacas, MD   PT End of Session - 05/13/22 1321     Visit Number 2    Number of Visits 13    Date for PT Re-Evaluation 06/21/22    Authorization Type Medicare/[employer]    PT Start Time 1319    PT Stop Time 1400    PT Time Calculation (min) 41 min    Activity Tolerance Patient tolerated treatment well    Behavior During Therapy [facility] for tasks assessed/performed              Past Medical History:  Diagnosis Date   Arthritis    DDD (degenerative disc disease), cervical    Deafness in right ear    Diverticulosis    Hearing loss in left ear    Heart murmur    Kidney stones    Psoriasis    Past Surgical History:  Procedure Laterality Date   APPENDECTOMY     BILATERAL SALPINGOOPHORECTOMY  07/22/1994   CATARACT EXTRACTION     CHOLECYSTECTOMY  07/22/2000   COLONOSCOPY  2003/2009/2015   gamma knife     TOTAL ABDOMINAL HYSTERECTOMY  07/22/1977   FIBROIDS    Patient Active Problem List   Diagnosis Date Noted   Gait abnormality 04/30/2022   Advanced nonexudative age-related macular degeneration of both eyes without subfoveal involvement 04/04/2022   Peripheral neuropathy 12/18/2021   Anxiety 09/27/2020   Aortic valve disorder 09/27/2020   Arthritis 09/27/2020   Hyperthyroidism 09/27/2020   Thyromegaly 09/27/2020   Body mass index (BMI) 36.0-36.9, adult 09/27/2020   History of radiation exposure 09/27/2020   Idiopathic kyphoscoliosis 09/27/2020   Macular degeneration 09/27/2020   Other seasonal allergic rhinitis 09/27/2020   Unsteady gait 09/27/2020   Arthritis, multiple joint involvement 08/21/2020   Atrophy of vagina 08/21/2020   Gastro-esophageal reflux disease without esophagitis 08/21/2020   Generalized anxiety disorder 08/21/2020   Mixed hyperlipidemia  08/21/2020   Osteoporosis 08/21/2020   Perennial allergic rhinitis with seasonal variation 08/21/2020   Psoriatic arthritis ([place]) 08/21/2020   Vitamin D deficiency 08/21/2020   Body mass index (BMI) 35.0-35.9, adult 05/08/2020   Lumbago with sciatica, right side 05/08/2020   Anterolisthesis of lumbar spine 05/08/2020   Other chronic pain 05/08/2020   Intermediate stage nonexudative age-related macular degeneration of both eyes 04/03/2020   Posterior vitreous detachment of right eye 04/03/2020   Posterior vitreous detachment of left eye 04/03/2020   Chronic secretory otitis media of right ear 09/23/2019   Sensorineural hearing loss (SNHL) of left ear with restricted hearing of right ear 06/09/2019   Multinodular goiter 11/07/2016   Neck pain 01/16/2015   Aortic stenosis 01/06/2014   Essential hypertension, benign 01/06/2014   Family history of coronary artery disease 01/06/2014   Diabetes ([place]) 01/06/2014   Chronic low back pain 07/05/2013   Displacement of lumbar intervertebral disc without myelopathy 07/05/2013   Lumbosacral radiculitis 07/05/2013   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  Used my rollator in home to do this over the weekend. Pt accompanied by: self  PERTINENT HISTORY: See above; bulging disk in lumbar spine  PAIN:  Are you having pain? Yes: NPRS scale: 3-4/10 Pain location: low back,  toes Pain description: soreness, sometimes sharp pain Aggravating factors: sitting or standing too long Relieving factors: exercises help  PRECAUTIONS: Fall  WEIGHT BEARING RESTRICTIONS No  FALLS: Has patient fallen in last 6 months? No Reports she has come close-R foot catches  LIVING ENVIRONMENT: Lives with: lives alone Lives in: House/apartment Stairs: Yes: Internal: 8, 5 steps; on right going up Has following equipment at home: Single point cane, Walker - 2 wheeled, Environmental consultant - 4 wheeled, and walkers were given to her; she doesn't typically use as they are too heavy to transport  PLOF: Independent, Independent with household mobility without device, and Independent with community mobility with device  PATIENT GOALS Pt's goals for therapy is to improve strength and avoid limiting my activities due to weakness  OBJECTIVE:    TODAY'S TREATMENT: 05/13/2022 Activity Comments  Gait trial with foot-up brace on RLE (had to secure ankle cuff with Coban due to the ankle cuff being too small for pt's ankle circumference) then gait trial without foot-up brace engaged Improved RLE foot clearance, heelstrike and step length with foot-up brace engaged.  Pt notes she can tell the difference and would like to try on BLEs  160 ft in 2 MWT, R foot-up brace, with cane With fatigue, decreased RLE foot clearance noted.   Discussed options for foot-up/AFO-see below   Seated ankle dorsiflexion 3 x 10 reps, 3 sec hold Cues for technique  Reviewed initial HEP from last visit-pt return demo understanding, with min cues for stagger stance weightshift technique       Access Code: [email or web address] URL: [email or web address] Date: 05/13/2022-most recent update Prepared by: [facility]  Program Notes Using your rollator walker, walk about 2-3 minutes in your hallway at home, focusing on taking long strides and standing up tall.  Exercises - Staggered Stance  Forward Backward Weight Shift with Counter Support  - 1 x daily - 5 x weekly - 1-2 sets - 10 reps - Standing Hip Abduction with Counter Support  - 1 x daily - 5 x weekly - 1-2 sets - 10 reps - Standing March with Counter Support  - 1 x daily - 5 x weekly - 1-2 sets - 10 reps - Seated Ankle Dorsiflexion AROM  - 1 x daily - 7 x weekly - 3 sets - 10 reps  PATIENT EDUCATION: Education details: HEP addition; discussed foot-up trial (pros and cons; pt reports she would likely not be able to pay out of pocket for foot-up brace(s)); discussed options for AFO-including orthotist consult, but pt doesn't seem that she needs AFO at this time; no trials in clinic of pt's size available Person educated: Patient Education method: Explanation, Demonstration, and Handouts Education comprehension: verbalized understanding and returned demonstration  ------------------------------------------------------------------- (Objective measures below from evaluation findings):  DIAGNOSTIC FINDINGS: MRI cervical spine (05/07/2022) - At C4-5 disc bulging, facet and ligamentum flavum hypertrophy with moderate spinal stenosis and severe bilateral foraminal stenosis; no cord signal changes. - At C5-6 disc bulging and facet hypertrophy with moderate spinal stenosis and severe bilateral foraminal stenosis; no cord signal changes. - At C6-7 disc bulging and facet hypertrophy with mild spinal stenosis and severe bilateral foraminal stenosis. - At  C3-4 disc bulging with severe right foraminal stenosis. lumbar spine  Lumbar spine MRI 02/18/2022: 1. Mild central canal stenosis L1-2 with mild progression since September 2022 MRI 2. Mild central canal stenosis L2-3. Mild progression of right subarticular stenosis L2-3 3. Mild central canal stenosis L3-4 with mild progression. Moderate right subarticular stenosis with mild progression 4. Moderate central canal stenosis L4-5 with moderate subarticular stenosis on the right,  unchanged. 5. Mild anterolisthesis L5-S1 due to severe facet degeneration. No significant neural impingement.  COGNITION: Overall cognitive status: Within functional limits for tasks assessed   SENSATION: Light touch: WFL  EDEMA:  Noted BLEs-pt states this is not new  POSTURE: rounded shoulders and forward head  LOWER EXTREMITY ROM:    R hip flexion slightly decreased against gravity in sitting; -10 degrees R ankle dflex, +8 degrees L ankle dflex  Active  Right Eval Left Eval  Hip flexion    Hip extension    Hip abduction    Hip adduction    Hip internal rotation    Hip external rotation    Knee flexion    Knee extension    Ankle dorsiflexion    Ankle plantarflexion    Ankle inversion    Ankle eversion     (Blank rows = not tested)  LOWER EXTREMITY MMT:    MMT Right Eval Left Eval  Hip flexion 3+ 4  Hip extension    Hip abduction 4 4  Hip adduction 4 4  Hip internal rotation    Hip external rotation    Knee flexion 3+ 4  Knee extension 4 4  Ankle dorsiflexion 3- 3+  Ankle plantarflexion    Ankle inversion    Ankle eversion    (Blank rows = not tested)  TRANSFERS: Assistive device utilized:  able to stand without UE support, but uses backs of legs at chair arms forward for momentum and None  Sit to stand: SBA Stand to sit: SBA  GAIT: Gait pattern: step through pattern, decreased step length- Right, decreased step length- Left, decreased stride length, decreased ankle dorsiflexion- Right, wide BOS, and poor foot clearance- Right Distance walked: 40 ft x 2 Assistive device utilized: Single point cane Level of assistance: SBA Comments: Decreased hip stability noted with gait-Trendelenburg pattern.  Brief trial of rollator-pt able to take longer strides  FUNCTIONAL TESTs:  5 times sit to stand: 34.78 sec with hands at knees Timed up and go (TUG): 32.94 sec Single limb stance:  < 1 sec each leg Tandem stance;  RLE posterior 11.4 sec, LLE posterior 13.5  sec  TODAY'S TREATMENT:  Initiated HEP-see below   PATIENT EDUCATION: Education details: PT POC, eval results, initial HEP Person educated: Patient Education method: Explanation, Demonstration, and Handouts Education comprehension: verbalized understanding, returned demonstration, and needs further education   HOME EXERCISE PROGRAM: Access Code: [email or web address] URL: [email or web address] Date: 05/09/2022 Prepared by: [facility]  Program Notes Using your rollator walker, walk about 2-3 minutes in your hallway at home, focusing on taking long strides and standing up tall.  Exercises - Staggered Stance Forward Backward Weight Shift with Counter Support  - 1 x daily - 5 x weekly - 1-2 sets - 10 reps - Standing Hip Abduction with Counter Support  - 1 x daily - 5 x weekly - 1-2 sets - 10 reps - Standing March with Counter Support  - 1 x daily - 5 x weekly - 1-2 sets - 10 reps  GOALS: Goals reviewed with patient? Yes  SHORT TERM GOALS: Target date: 06/07/2022  Pt will be independent with HEP for improved strength, balance, gait. Baseline: Goal status: IN PROGRESS  2.  Pt will improve 5x sit<>stand to less than or equal to 25 sec to demonstrate improved functional strength and transfer efficiency.  Baseline: 34.78 sec Goal status: IN PROGRESS  3.  Pt will improve TUG score to less than or equal to 25 sec for decreased fall risk. Baseline: 32.94 sec Goal status: IN PROGRESS   LONG TERM GOALS: Target date: 06/21/2022  Pt will be independent with progression of HEP for improved strength, balance, gait.  Baseline:  Goal status: IN PROGRESS  2.  Pt will improve 5x sit<>stand to less than or equal to 20 sec to demonstrate improved functional strength and transfer efficiency. Baseline: 34.78 sec Goal status:IN PROGRESS  3.  Pt will improve TUG score to less than or equal to 20 sec for decreased fall risk. Baseline: 32.94 Goal  status: IN PROGRESS  4.  Gait velocity score to be assessed, with goal to be written as appropriate.  Baseline:  Goal status: IN PROGRESS  5.  2MWT to be assessed and goal to be written as appropriate.  Improve 2WMT distance by at least 40 ft for improved gait efficiency and safety. Baseline: 160 ft 05/13/2022 Goal status: IN PROGRESS  ASSESSMENT:  CLINICAL IMPRESSION: Skilled PT session today trialed foot-up brace in PT session today.  Trial cuff size large is too small for patient and used Coban to hold together for trial.  Pt initially feels and notes improved R foot clearance and step length; however, with fatigue, pt reverts back to shorted step length and decreased step length.  Pt doesn't appear to have foot drop with gait, rather, decreased full active ankle dorsiflexion and flat foot landing with stance.  She voices concern over bracing options because of potential out of pocket costs.  Addressed ankle strength with addition of HEP exercise for ankle dorsiflexion.  She will continue to benefit from skilled PT towards goals for improved strength, balance, gait for decreased fall risk and improved functional mobility.    OBJECTIVE IMPAIRMENTS Abnormal gait, decreased balance, decreased mobility, difficulty walking, decreased ROM, decreased strength, and impaired flexibility.   ACTIVITY LIMITATIONS standing, transfers, and locomotion level  PARTICIPATION LIMITATIONS: shopping, community activity, and church  PERSONAL FACTORS 3+ comorbidities: see above-note extensive lumbar/cervical involvement (has already had PT this year for lumbar PT)  are also affecting patient's functional outcome.   REHAB POTENTIAL: Good  CLINICAL DECISION MAKING: Evolving/moderate complexity  EVALUATION COMPLEXITY: Moderate  PLAN: PT FREQUENCY: 2x/week  PT DURATION: 6 weeks plus eval visit  PLANNED INTERVENTIONS: Therapeutic exercises, Therapeutic activity, Neuromuscular re-education, Balance training,  Gait training, Patient/Family education, Self Care, Orthotic/Fit training, DME instructions, and Manual therapy  PLAN FOR NEXT SESSION: Discuss pt's thoughts on foot-up brace/ AFO; review HEP and progress as able.  Work on ankle and hip strength.  Discuss use of rollator for more stable gait.  Assess gait velocity    Hondo Nanda W., PT 05/13/2022, 2:30 PM  [facility] [place], [place], [place] [address] Phone # [phone] Fax # [phone]

## 2022-05-13 NOTE — Addendum Note (Signed)
Addended by: Cristela Felt E on: 05/13/2022 03:56 PM   Modules accepted: Orders

## 2022-05-14 ENCOUNTER — Telehealth: Payer: Self-pay | Admitting: Neurology

## 2022-05-14 DIAGNOSIS — M47812 Spondylosis without myelopathy or radiculopathy, cervical region: Secondary | ICD-10-CM | POA: Insufficient documentation

## 2022-05-14 NOTE — Telephone Encounter (Signed)
Orders Placed This Encounter  Procedures   Ambulatory referral to Neurosurgery

## 2022-05-14 NOTE — Telephone Encounter (Signed)
Referral for neurosurgery fax to [facility]. Phone: [phone], Fax: [phone] 

## 2022-05-14 NOTE — Addendum Note (Signed)
Addended by: Marcial Pacas on: 05/14/2022 03:15 PM   Modules accepted: Orders

## 2022-05-15 DIAGNOSIS — E0520 Thyrotoxicosis with toxic multinodular goiter without thyrotoxic crisis or storm: Secondary | ICD-10-CM | POA: Diagnosis not present

## 2022-05-16 ENCOUNTER — Other Ambulatory Visit: Payer: Medicare Other

## 2022-05-16 ENCOUNTER — Ambulatory Visit: Payer: Medicare Other | Admitting: Physical Therapy

## 2022-05-16 NOTE — Therapy (Signed)
OUTPATIENT PHYSICAL THERAPY NEURO TREATMENT NOTE   Patient Name: Lisa [name] MRN: [id number] DOB:08/24/1935, 86 y.o., female Today's Date: 05/17/2022   PCP: Lawerance Cruel, MD REFERRING PROVIDER: Marcial Pacas, MD   PT End of Session - 05/17/22 1011     Visit Number 3    Number of Visits 13    Date for PT Re-Evaluation 06/21/22    Authorization Type Medicare/[employer]    PT Start Time 0926    PT Stop Time 1011    PT Time Calculation (min) 45 min    Activity Tolerance Patient tolerated treatment well;Patient limited by pain    Behavior During Therapy [facility] for tasks assessed/performed               Past Medical History:  Diagnosis Date   Arthritis    DDD (degenerative disc disease), cervical    Deafness in right ear    Diverticulosis    Hearing loss in left ear    Heart murmur    Kidney stones    Psoriasis    Past Surgical History:  Procedure Laterality Date   APPENDECTOMY     BILATERAL SALPINGOOPHORECTOMY  07/22/1994   CATARACT EXTRACTION     CHOLECYSTECTOMY  07/22/2000   COLONOSCOPY  2003/2009/2015   gamma knife     TOTAL ABDOMINAL HYSTERECTOMY  07/22/1977   FIBROIDS    Patient Active Problem List   Diagnosis Date Noted   Cervical spondylosis 05/14/2022   Gait abnormality 04/30/2022   Advanced nonexudative age-related macular degeneration of both eyes without subfoveal involvement 04/04/2022   Peripheral neuropathy 12/18/2021   Anxiety 09/27/2020   Aortic valve disorder 09/27/2020   Arthritis 09/27/2020   Hyperthyroidism 09/27/2020   Thyromegaly 09/27/2020   Body mass index (BMI) 36.0-36.9, adult 09/27/2020   History of radiation exposure 09/27/2020   Idiopathic kyphoscoliosis 09/27/2020   Macular degeneration 09/27/2020   Other seasonal allergic rhinitis 09/27/2020   Unsteady gait 09/27/2020   Arthritis, multiple joint involvement 08/21/2020   Atrophy of vagina 08/21/2020   Gastro-esophageal reflux disease without esophagitis 08/21/2020    Generalized anxiety disorder 08/21/2020   Mixed hyperlipidemia 08/21/2020   Osteoporosis 08/21/2020   Perennial allergic rhinitis with seasonal variation 08/21/2020   Psoriatic arthritis ([place]) 08/21/2020   Vitamin D deficiency 08/21/2020   Body mass index (BMI) 35.0-35.9, adult 05/08/2020   Lumbago with sciatica, right side 05/08/2020   Anterolisthesis of lumbar spine 05/08/2020   Other chronic pain 05/08/2020   Intermediate stage nonexudative age-related macular degeneration of both eyes 04/03/2020   Posterior vitreous detachment of right eye 04/03/2020   Posterior vitreous detachment of left eye 04/03/2020   Chronic secretory otitis media of right ear 09/23/2019   Sensorineural hearing loss (SNHL) of left ear with restricted hearing of right ear 06/09/2019   Multinodular goiter 11/07/2016   Neck pain 01/16/2015   Aortic stenosis 01/06/2014   Essential hypertension, benign 01/06/2014   Family history of coronary artery disease 01/06/2014   Diabetes ([place]) 01/06/2014   Chronic low back pain 07/05/2013   Displacement of lumbar intervertebral disc without myelopathy 07/05/2013   Lumbosacral radiculitis 07/05/2013   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 Suspects that it could be d/t lifting something last night. Forgot her hearing aids.  Pt accompanied by: self  PERTINENT  HISTORY: See above; bulging disk in lumbar spine  PAIN:  Are you having pain? Yes: NPRS scale: 7/10 Pain location: from neck to LB Pain description: soreness, sometimes sharp pain Aggravating factors: sitting or standing too long Relieving factors: exercises help  PRECAUTIONS: Fall  WEIGHT BEARING RESTRICTIONS No  FALLS: Has patient fallen in last 6 months? No Reports she has come close-R foot catches  LIVING ENVIRONMENT: Lives with: lives alone Lives in: House/apartment Stairs: Yes: Internal: 8, 5 steps; on right going up Has following equipment at home: Single point cane, Walker - 2 wheeled, Environmental consultant - 4 wheeled, and walkers were given to her; she doesn't typically use as they are too heavy to transport  PLOF: Independent, Independent with household mobility without device, and Independent with community mobility with device  PATIENT GOALS Pt's goals for therapy is to improve strength and avoid limiting my activities due to weakness  OBJECTIVE:      TODAY'S TREATMENT: 05/17/22 Activity Comments  41M walk test 25.2 sec with cane (1.3 ft/sec)  Gait with RW x~60 ft  Edu on folding walker and lifting it to assess for ability to transport it in the far; improved stability with walker vs. cane  Sitting pelvic tilts  Good understanding of movement pattern; to tolerance   Prayer stretch with green pball Very limited ROM- cueing to increase amplitude   Sitting trunk rotation To tolerance; limited ROM  sitting ankle DF 10x, with red TB 10x each Good amplitude  standing march 20x, with red TB x10 Encouraged hip flexion vs. Knee flexion  standing hip ABD 10x, red TB 10x Muhc more difficulty with TB  staggered stance fwd/back wt shift                  HOME EXERCISE PROGRAM Last updated: 05/17/22 Access Code: [email or web address] URL: [email or web address] Date: 05/17/2022 Prepared by: [facility]  Program Notes Using your rollator  walker, walk about 2-3 minutes in your hallway at home, focusing on taking long strides and standing up tall.  Exercises - Staggered Stance Forward Backward Weight Shift with Counter Support  - 1 x daily - 5 x weekly - 1-2 sets - 10 reps - Standing Hip Abduction with Resistance at Ankles and Counter Support  - 1 x daily - 5 x weekly - 2 sets - 10 reps - Marching with Resistance  - 1 x daily - 5 x weekly - 2 sets - 10 reps - Seated Ankle Dorsiflexion with Resistance  - 1 x daily - 5 x weekly - 2 sets - 10 reps  PATIENT EDUCATION: Education details: HEP update with specific instruction on safety with these exercises; edu on benefits of RW in community Person educated: Patient Education method: Consulting civil engineer, Demonstration, Tactile cues, Verbal cues, and Handouts Education comprehension: verbalized understanding and returned demonstration    ------------------------------------------------------------------- (Objective measures below from evaluation findings):  DIAGNOSTIC FINDINGS: MRI cervical spine (05/07/2022) - At C4-5 disc bulging, facet and ligamentum flavum hypertrophy with moderate spinal stenosis and severe bilateral foraminal stenosis; no cord signal changes. - At C5-6 disc bulging and facet hypertrophy with moderate spinal stenosis and severe bilateral foraminal stenosis; no cord signal changes. - At C6-7 disc bulging and facet hypertrophy with mild spinal stenosis  and severe bilateral foraminal stenosis. - At C3-4 disc bulging with severe right foraminal stenosis. lumbar spine  Lumbar spine MRI 02/18/2022: 1. Mild central canal stenosis L1-2 with mild progression since September 2022 MRI 2. Mild central canal stenosis L2-3. Mild progression of right subarticular stenosis L2-3 3. Mild central canal stenosis L3-4 with mild progression. Moderate right subarticular stenosis with mild progression 4. Moderate central canal stenosis L4-5 with moderate subarticular stenosis on the right,  unchanged. 5. Mild anterolisthesis L5-S1 due to severe facet degeneration. No significant neural impingement.  COGNITION: Overall cognitive status: Within functional limits for tasks assessed   SENSATION: Light touch: WFL  EDEMA:  Noted BLEs-pt states this is not new  POSTURE: rounded shoulders and forward head  LOWER EXTREMITY ROM:    R hip flexion slightly decreased against gravity in sitting; -10 degrees R ankle dflex, +8 degrees L ankle dflex  Active  Right Eval Left Eval  Hip flexion    Hip extension    Hip abduction    Hip adduction    Hip internal rotation    Hip external rotation    Knee flexion    Knee extension    Ankle dorsiflexion    Ankle plantarflexion    Ankle inversion    Ankle eversion     (Blank rows = not tested)  LOWER EXTREMITY MMT:    MMT Right Eval Left Eval  Hip flexion 3+ 4  Hip extension    Hip abduction 4 4  Hip adduction 4 4  Hip internal rotation    Hip external rotation    Knee flexion 3+ 4  Knee extension 4 4  Ankle dorsiflexion 3- 3+  Ankle plantarflexion    Ankle inversion    Ankle eversion    (Blank rows = not tested)  TRANSFERS: Assistive device utilized:  able to stand without UE support, but uses backs of legs at chair arms forward for momentum and None  Sit to stand: SBA Stand to sit: SBA  GAIT: Gait pattern: step through pattern, decreased step length- Right, decreased step length- Left, decreased stride length, decreased ankle dorsiflexion- Right, wide BOS, and poor foot clearance- Right Distance walked: 40 ft x 2 Assistive device utilized: Single point cane Level of assistance: SBA Comments: Decreased hip stability noted with gait-Trendelenburg pattern.  Brief trial of rollator-pt able to take longer strides  FUNCTIONAL TESTs:  5 times sit to stand: 34.78 sec with hands at knees Timed up and go (TUG): 32.94 sec Single limb stance:  < 1 sec each leg Tandem stance;  RLE posterior 11.4 sec, LLE posterior 13.5  sec  TODAY'S TREATMENT:  Initiated HEP-see below   PATIENT EDUCATION: Education details: PT POC, eval results, initial HEP Person educated: Patient Education method: Explanation, Demonstration, and Handouts Education comprehension: verbalized understanding, returned demonstration, and needs further education   HOME EXERCISE PROGRAM: Access Code: [email or web address] URL: [email or web address] Date: 05/09/2022 Prepared by: [facility]  Program Notes Using your rollator walker, walk about 2-3 minutes in your hallway at home, focusing on taking long strides and standing up tall.  Exercises - Staggered Stance Forward Backward Weight Shift with Counter Support  - 1 x daily - 5 x weekly - 1-2 sets - 10 reps - Standing Hip Abduction with Counter Support  - 1 x daily - 5 x weekly - 1-2 sets - 10 reps - Standing March with Counter Support  - 1 x daily - 5 x weekly - 1-2  sets - 10 reps    GOALS: Goals reviewed with patient? Yes  SHORT TERM GOALS: Target date: 06/07/2022  Pt will be independent with HEP for improved strength, balance, gait. Baseline: Goal status: IN PROGRESS  2.  Pt will improve 5x sit<>stand to less than or equal to 25 sec to demonstrate improved functional strength and transfer efficiency.  Baseline: 34.78 sec Goal status: IN PROGRESS  3.  Pt will improve TUG score to less than or equal to 25 sec for decreased fall risk. Baseline: 32.94 sec Goal status: IN PROGRESS   LONG TERM GOALS: Target date: 06/21/2022  Pt will be independent with progression of HEP for improved strength, balance, gait.  Baseline:  Goal status: IN PROGRESS  2.  Pt will improve 5x sit<>stand to less than or equal to 20 sec to demonstrate improved functional strength and transfer efficiency. Baseline: 34.78 sec Goal status:IN PROGRESS  3.  Pt will improve TUG score to less than or equal to 20 sec for decreased fall risk. Baseline: 32.94 Goal  status: IN PROGRESS  4.  Patient to demonstrate gait speed of 1.8 ft/sec in order to improve safe access to community.   Baseline: 1.3 ft/sec) Goal status: IN PROGRESS  5.  2MWT to be assessed and goal to be written as appropriate.  Improve 2WMT distance by at least 40 ft for improved gait efficiency and safety. Baseline: 160 ft 05/13/2022 Goal status: IN PROGRESS  ASSESSMENT:  CLINICAL IMPRESSION: Patient arrived to session with report of increased LBP today d/t lifting something yesterday. Patient demonstrated significantly slow gait speed with cane. Trialed gait training with RW which appeared more stable; specific edu required to promote safe turns. Worked on gentle lumbopelvic ROM; patient requested not to perform mat exercises d/t amount of effort required to transfer in/out of bed/mat. Reviewed HEP which revealed good tolerance and performance- able to progress with banded resistance for max strengthening. Patient reported understanding of HEP update and reported improvement in LBP and walking quality upon leaving.   OBJECTIVE IMPAIRMENTS Abnormal gait, decreased balance, decreased mobility, difficulty walking, decreased ROM, decreased strength, and impaired flexibility.   ACTIVITY LIMITATIONS standing, transfers, and locomotion level  PARTICIPATION LIMITATIONS: shopping, community activity, and church  PERSONAL FACTORS 3+ comorbidities: see above-note extensive lumbar/cervical involvement (has already had PT this year for lumbar PT)  are also affecting patient's functional outcome.   REHAB POTENTIAL: Good  CLINICAL DECISION MAKING: Evolving/moderate complexity  EVALUATION COMPLEXITY: Moderate  PLAN: PT FREQUENCY: 2x/week  PT DURATION: 6 weeks plus eval visit  PLANNED INTERVENTIONS: Therapeutic exercises, Therapeutic activity, Neuromuscular re-education, Balance training, Gait training, Patient/Family education, Self Care, Orthotic/Fit training, DME instructions, and Manual  therapy  PLAN FOR NEXT SESSION: Discuss pt's thoughts on foot-up brace/ AFO; Work on ankle and hip strength.  Continue to practice rollator for more stable gait.      Janene Harvey, PT, DPT 05/17/22 10:14 AM  [facility] at [facility] [place], [place] [place], [place] [address] Phone # [phone] Fax # [phone]

## 2022-05-17 ENCOUNTER — Encounter: Payer: Self-pay | Admitting: Physical Therapy

## 2022-05-17 ENCOUNTER — Ambulatory Visit: Payer: Medicare Other | Admitting: Physical Therapy

## 2022-05-17 DIAGNOSIS — R2681 Unsteadiness on feet: Secondary | ICD-10-CM | POA: Diagnosis not present

## 2022-05-17 DIAGNOSIS — R350 Frequency of micturition: Secondary | ICD-10-CM | POA: Diagnosis not present

## 2022-05-17 DIAGNOSIS — M6281 Muscle weakness (generalized): Secondary | ICD-10-CM

## 2022-05-17 DIAGNOSIS — R2689 Other abnormalities of gait and mobility: Secondary | ICD-10-CM

## 2022-05-17 DIAGNOSIS — R269 Unspecified abnormalities of gait and mobility: Secondary | ICD-10-CM | POA: Diagnosis not present

## 2022-05-17 DIAGNOSIS — Z6838 Body mass index (BMI) 38.0-38.9, adult: Secondary | ICD-10-CM | POA: Diagnosis not present

## 2022-05-17 DIAGNOSIS — L4050 Arthropathic psoriasis, unspecified: Secondary | ICD-10-CM | POA: Diagnosis not present

## 2022-05-17 DIAGNOSIS — K219 Gastro-esophageal reflux disease without esophagitis: Secondary | ICD-10-CM | POA: Diagnosis not present

## 2022-05-20 ENCOUNTER — Encounter: Payer: Self-pay | Admitting: Physical Therapy

## 2022-05-20 ENCOUNTER — Ambulatory Visit: Payer: Medicare Other | Admitting: Physical Therapy

## 2022-05-20 DIAGNOSIS — M6281 Muscle weakness (generalized): Secondary | ICD-10-CM | POA: Diagnosis not present

## 2022-05-20 DIAGNOSIS — R2681 Unsteadiness on feet: Secondary | ICD-10-CM

## 2022-05-20 DIAGNOSIS — R2689 Other abnormalities of gait and mobility: Secondary | ICD-10-CM

## 2022-05-20 DIAGNOSIS — L4050 Arthropathic psoriasis, unspecified: Secondary | ICD-10-CM | POA: Diagnosis not present

## 2022-05-20 DIAGNOSIS — R269 Unspecified abnormalities of gait and mobility: Secondary | ICD-10-CM | POA: Diagnosis not present

## 2022-05-20 NOTE — Therapy (Signed)
OUTPATIENT PHYSICAL THERAPY NEURO TREATMENT NOTE   Patient Name: Lisa [name] MRN: [id number] DOB:08/20/1935, 86 y.o., female Today's Date: 05/20/2022   PCP: Lawerance Cruel, MD REFERRING PROVIDER: Marcial Pacas, MD   PT End of Session - 05/20/22 1022     Visit Number 4    Number of Visits 13    Date for PT Re-Evaluation 06/21/22    Authorization Type Medicare/[employer]    PT Start Time 1022    PT Stop Time 1100    PT Time Calculation (min) 38 min    Activity Tolerance Patient tolerated treatment well    Behavior During Therapy WFL for tasks assessed/performed                Past Medical History:  Diagnosis Date   Arthritis    DDD (degenerative disc disease), cervical    Deafness in right ear    Diverticulosis    Hearing loss in left ear    Heart murmur    Kidney stones    Psoriasis    Past Surgical History:  Procedure Laterality Date   APPENDECTOMY     BILATERAL SALPINGOOPHORECTOMY  07/22/1994   CATARACT EXTRACTION     CHOLECYSTECTOMY  07/22/2000   COLONOSCOPY  2003/2009/2015   gamma knife     TOTAL ABDOMINAL HYSTERECTOMY  07/22/1977   FIBROIDS    Patient Active Problem List   Diagnosis Date Noted   Cervical spondylosis 05/14/2022   Gait abnormality 04/30/2022   Advanced nonexudative age-related macular degeneration of both eyes without subfoveal involvement 04/04/2022   Peripheral neuropathy 12/18/2021   Anxiety 09/27/2020   Aortic valve disorder 09/27/2020   Arthritis 09/27/2020   Hyperthyroidism 09/27/2020   Thyromegaly 09/27/2020   Body mass index (BMI) 36.0-36.9, adult 09/27/2020   History of radiation exposure 09/27/2020   Idiopathic kyphoscoliosis 09/27/2020   Macular degeneration 09/27/2020   Other seasonal allergic rhinitis 09/27/2020   Unsteady gait 09/27/2020   Arthritis, multiple joint involvement 08/21/2020   Atrophy of vagina 08/21/2020   Gastro-esophageal reflux disease without esophagitis 08/21/2020   Generalized anxiety disorder  08/21/2020   Mixed hyperlipidemia 08/21/2020   Osteoporosis 08/21/2020   Perennial allergic rhinitis with seasonal variation 08/21/2020   Psoriatic arthritis ([place]) 08/21/2020   Vitamin D deficiency 08/21/2020   Body mass index (BMI) 35.0-35.9, adult 05/08/2020   Lumbago with sciatica, right side 05/08/2020   Anterolisthesis of lumbar spine 05/08/2020   Other chronic pain 05/08/2020   Intermediate stage nonexudative age-related macular degeneration of both eyes 04/03/2020   Posterior vitreous detachment of right eye 04/03/2020   Posterior vitreous detachment of left eye 04/03/2020   Chronic secretory otitis media of right ear 09/23/2019   Sensorineural hearing loss (SNHL) of left ear with restricted hearing of right ear 06/09/2019   Multinodular goiter 11/07/2016   Neck pain 01/16/2015   Aortic stenosis 01/06/2014   Essential hypertension, benign 01/06/2014   Family history of coronary artery disease 01/06/2014   Diabetes ([place]) 01/06/2014   Chronic low back pain 07/05/2013   Displacement of lumbar intervertebral disc without myelopathy 07/05/2013   Lumbosacral radiculitis 07/05/2013   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  Think I'm doing a little better.   Pt accompanied by: self  PERTINENT HISTORY: See above; bulging disk in lumbar spine  PAIN:  Are you having pain? Yes: NPRS scale: 3/10 Pain location: from neck to LB Pain  description: soreness, sometimes sharp pain Aggravating factors: sitting or standing too long Relieving factors: exercises help  PRECAUTIONS: Fall  WEIGHT BEARING RESTRICTIONS No  FALLS: Has patient fallen in last 6 months? No Reports she has come close-R foot catches  LIVING ENVIRONMENT: Lives with: lives alone Lives in: House/apartment Stairs: Yes: Internal: 8, 5 steps; on right going up Has following equipment at home: Single point cane, Walker - 2 wheeled, Environmental consultant - 4 wheeled, and walkers were given to her; she doesn't typically use as they are too heavy to transport  PLOF: Independent, Independent with household mobility without device, and Independent with community mobility with device  PATIENT GOALS Pt's goals for therapy is to improve strength and avoid limiting my activities due to weakness  OBJECTIVE:     TODAY'S TREATMENT: 05/20/2022 Activity Comments  Reviewed HEP additions from last visit (see below) Pt return demo understanding. Does need cues for technique with band  Wide BOS with ant/posterior weightshift for low back, hamstring, gastroc stretch, 10 reps   Alt step taps to 4" shelf, 2 x 10 reps  BUE support>1 UE support  Sidestepping along counter, 4 reps R and L Light UE support  Forward step over obstacle, return to midline Decreased foot clearance LLE throughout  Forward/back walking along counter, 3 reps with 1 UE support More difficulty with backward walking  Forward/back step and weightshift x 10 reps each leg Difficulty with RLE as stance with decreased ability to bring LLE into posterior direction  Gait with cane, supervision with cues for increased step length, 50 ft x 4 reps With fatigue, decreased foot clearance      HOME EXERCISE PROGRAM Last updated: 05/17/22 Access Code: [email or web address] URL: [email or web address] Date: 05/17/2022 Prepared by: [facility]  Program Notes Using your rollator walker,  walk about 2-3 minutes in your hallway at home, focusing on taking long strides and standing up tall.  Exercises - Staggered Stance Forward Backward Weight Shift with Counter Support  - 1 x daily - 5 x weekly - 1-2 sets - 10 reps - Standing Hip Abduction with Resistance at Ankles and Counter Support  - 1 x daily - 5 x weekly - 2 sets - 10 reps - Marching with Resistance  - 1 x daily - 5 x weekly - 2 sets - 10 reps - Seated Ankle Dorsiflexion with Resistance  - 1 x daily - 5 x weekly - 2 sets - 10 reps  PATIENT EDUCATION: Education details: HEP update with specific instruction on safety with these exercises; edu on benefits of RW in community Person educated: Patient Education method: Consulting civil engineer, Demonstration, Tactile cues, Verbal cues, and Handouts Education comprehension: verbalized understanding and returned demonstration    ------------------------------------------------------------------- (Objective measures below from evaluation findings):  DIAGNOSTIC FINDINGS: MRI cervical spine (05/07/2022) - At C4-5 disc bulging, facet and ligamentum flavum hypertrophy with moderate spinal stenosis and severe bilateral foraminal stenosis; no cord signal changes. - At C5-6 disc bulging and facet hypertrophy with moderate spinal stenosis and severe bilateral foraminal stenosis; no cord signal changes. - At C6-7 disc bulging and facet hypertrophy with mild spinal stenosis and severe bilateral foraminal stenosis. - At C3-4 disc bulging with severe right foraminal  stenosis. lumbar spine  Lumbar spine MRI 02/18/2022: 1. Mild central canal stenosis L1-2 with mild progression since September 2022 MRI 2. Mild central canal stenosis L2-3. Mild progression of right subarticular stenosis L2-3 3. Mild central canal stenosis L3-4 with mild progression. Moderate right subarticular stenosis with mild progression 4. Moderate central canal stenosis L4-5 with moderate subarticular stenosis on the right,  unchanged. 5. Mild anterolisthesis L5-S1 due to severe facet degeneration. No significant neural impingement.  COGNITION: Overall cognitive status: Within functional limits for tasks assessed   SENSATION: Light touch: WFL  EDEMA:  Noted BLEs-pt states this is not new  POSTURE: rounded shoulders and forward head  LOWER EXTREMITY ROM:    R hip flexion slightly decreased against gravity in sitting; -10 degrees R ankle dflex, +8 degrees L ankle dflex  Active  Right Eval Left Eval  Hip flexion    Hip extension    Hip abduction    Hip adduction    Hip internal rotation    Hip external rotation    Knee flexion    Knee extension    Ankle dorsiflexion    Ankle plantarflexion    Ankle inversion    Ankle eversion     (Blank rows = not tested)  LOWER EXTREMITY MMT:    MMT Right Eval Left Eval  Hip flexion 3+ 4  Hip extension    Hip abduction 4 4  Hip adduction 4 4  Hip internal rotation    Hip external rotation    Knee flexion 3+ 4  Knee extension 4 4  Ankle dorsiflexion 3- 3+  Ankle plantarflexion    Ankle inversion    Ankle eversion    (Blank rows = not tested)  TRANSFERS: Assistive device utilized:  able to stand without UE support, but uses backs of legs at chair arms forward for momentum and None  Sit to stand: SBA Stand to sit: SBA  GAIT: Gait pattern: step through pattern, decreased step length- Right, decreased step length- Left, decreased stride length, decreased ankle dorsiflexion- Right, wide BOS, and poor foot clearance- Right Distance walked: 40 ft x 2 Assistive device utilized: Single point cane Level of assistance: SBA Comments: Decreased hip stability noted with gait-Trendelenburg pattern.  Brief trial of rollator-pt able to take longer strides  FUNCTIONAL TESTs:  5 times sit to stand: 34.78 sec with hands at knees Timed up and go (TUG): 32.94 sec Single limb stance:  < 1 sec each leg Tandem stance;  RLE posterior 11.4 sec, LLE posterior 13.5  sec  TODAY'S TREATMENT:  Initiated HEP-see below   PATIENT EDUCATION: Education details: PT POC, eval results, initial HEP Person educated: Patient Education method: Explanation, Demonstration, and Handouts Education comprehension: verbalized understanding, returned demonstration, and needs further education   HOME EXERCISE PROGRAM: Access Code: [email or web address] URL: [email or web address] Date: 05/09/2022 Prepared by: [facility]  Program Notes Using your rollator walker, walk about 2-3 minutes in your hallway at home, focusing on taking long strides and standing up tall.  Exercises - Staggered Stance Forward Backward Weight Shift with Counter Support  - 1 x daily - 5 x weekly - 1-2 sets - 10 reps - Standing Hip Abduction with Counter Support  - 1 x daily - 5 x weekly - 1-2 sets - 10 reps - Standing March with Counter Support  - 1 x daily - 5 x weekly - 1-2 sets - 10 reps    GOALS: Goals reviewed with patient? Yes  SHORT TERM GOALS: Target date: 06/07/2022  Pt will be independent with HEP for improved strength, balance, gait. Baseline: Goal status: IN PROGRESS  2.  Pt will improve 5x sit<>stand to less than or equal to 25 sec to demonstrate improved functional strength and transfer efficiency.  Baseline: 34.78 sec Goal status: IN PROGRESS  3.  Pt will improve TUG score to less than or equal to 25 sec for decreased fall risk. Baseline: 32.94 sec Goal status: IN PROGRESS   LONG TERM GOALS: Target date: 06/21/2022  Pt will be independent with progression of HEP for improved strength, balance, gait.  Baseline:  Goal status: IN PROGRESS  2.  Pt will improve 5x sit<>stand to less than or equal to 20 sec to demonstrate improved functional strength and transfer efficiency. Baseline: 34.78 sec Goal status:IN PROGRESS  3.  Pt will improve TUG score to less than or equal to 20 sec for decreased fall risk. Baseline: 32.94 Goal  status: IN PROGRESS  4.  Patient to demonstrate gait speed of 1.8 ft/sec in order to improve safe access to community.   Baseline: 1.3 ft/sec) Goal status: IN PROGRESS  5.  2MWT to be assessed and goal to be written as appropriate.  Improve 2WMT distance by at least 40 ft for improved gait efficiency and safety. Baseline: 160 ft 05/13/2022 Goal status: IN PROGRESS  ASSESSMENT:  CLINICAL IMPRESSION: Skilled PT session today focused on exercises for lower extremity strength and balance.  Less c/o today of lumbar pain, so focus of session primarily is strength and balance.  Pt requires UE support, but is able to perform with lesser UE support with cues.  For short distances, she is able to improve foot clearance and step length, but with fatigue, she reverts to Trendelenburg pattern, decreased stance time/foot clearance/step length.  She will continue to benefit from skilled PT towards goals for overall improved stability and decreased fall risk.  OBJECTIVE IMPAIRMENTS Abnormal gait, decreased balance, decreased mobility, difficulty walking, decreased ROM, decreased strength, and impaired flexibility.   ACTIVITY LIMITATIONS standing, transfers, and locomotion level  PARTICIPATION LIMITATIONS: shopping, community activity, and church  PERSONAL FACTORS 3+ comorbidities: see above-note extensive lumbar/cervical involvement (has already had PT this year for lumbar PT)  are also affecting patient's functional outcome.   REHAB POTENTIAL: Good  CLINICAL DECISION MAKING: Evolving/moderate complexity  EVALUATION COMPLEXITY: Moderate  PLAN: PT FREQUENCY: 2x/week  PT DURATION: 6 weeks plus eval visit  PLANNED INTERVENTIONS: Therapeutic exercises, Therapeutic activity, Neuromuscular re-education, Balance training, Gait training, Patient/Family education, Self Care, Orthotic/Fit training, DME instructions, and Manual therapy  PLAN FOR NEXT SESSION: Discuss pt's thoughts on foot-up brace/ AFO;  Work on ankle and hip strength.  Continue to practice rollator for more stable gait.      Mady Haagensen, PT 05/20/22 11:01 AM Phone: [phone] Fax: [phone]   [facility] at [facility] [place], [place], [place] [address] Phone # [phone] Fax # [phone]

## 2022-05-21 ENCOUNTER — Ambulatory Visit: Payer: Medicare Other | Admitting: Physical Therapy

## 2022-05-23 NOTE — Therapy (Signed)
OUTPATIENT PHYSICAL THERAPY NEURO TREATMENT NOTE   Patient Name: Lisa [name] MRN: [id number] DOB:1936-07-03, 86 y.o., female Today's Date: 05/24/2022   PCP: Lawerance Cruel, MD REFERRING PROVIDER: Marcial Pacas, MD   PT End of Session - 05/24/22 1139     Visit Number 5    Number of Visits 13    Date for PT Re-Evaluation 06/21/22    Authorization Type Medicare/[employer]    PT Start Time 1059    PT Stop Time 1140    PT Time Calculation (min) 41 min    Equipment Utilized During Treatment Gait belt    Activity Tolerance Patient tolerated treatment well;Patient limited by fatigue    Behavior During Therapy WFL for tasks assessed/performed                Past Medical History:  Diagnosis Date   Arthritis    DDD (degenerative disc disease), cervical    Deafness in right ear    Diverticulosis    Hearing loss in left ear    Heart murmur    Kidney stones    Psoriasis    Past Surgical History:  Procedure Laterality Date   APPENDECTOMY     BILATERAL SALPINGOOPHORECTOMY  07/22/1994   CATARACT EXTRACTION     CHOLECYSTECTOMY  07/22/2000   COLONOSCOPY  2003/2009/2015   gamma knife     TOTAL ABDOMINAL HYSTERECTOMY  07/22/1977   FIBROIDS    Patient Active Problem List   Diagnosis Date Noted   Cervical spondylosis 05/14/2022   Gait abnormality 04/30/2022   Advanced nonexudative age-related macular degeneration of both eyes without subfoveal involvement 04/04/2022   Peripheral neuropathy 12/18/2021   Anxiety 09/27/2020   Aortic valve disorder 09/27/2020   Arthritis 09/27/2020   Hyperthyroidism 09/27/2020   Thyromegaly 09/27/2020   Body mass index (BMI) 36.0-36.9, adult 09/27/2020   History of radiation exposure 09/27/2020   Idiopathic kyphoscoliosis 09/27/2020   Macular degeneration 09/27/2020   Other seasonal allergic rhinitis 09/27/2020   Unsteady gait 09/27/2020   Arthritis, multiple joint involvement 08/21/2020   Atrophy of vagina 08/21/2020   Gastro-esophageal  reflux disease without esophagitis 08/21/2020   Generalized anxiety disorder 08/21/2020   Mixed hyperlipidemia 08/21/2020   Osteoporosis 08/21/2020   Perennial allergic rhinitis with seasonal variation 08/21/2020   Psoriatic arthritis ([place]) 08/21/2020   Vitamin D deficiency 08/21/2020   Body mass index (BMI) 35.0-35.9, adult 05/08/2020   Lumbago with sciatica, right side 05/08/2020   Anterolisthesis of lumbar spine 05/08/2020   Other chronic pain 05/08/2020   Intermediate stage nonexudative age-related macular degeneration of both eyes 04/03/2020   Posterior vitreous detachment of right eye 04/03/2020   Posterior vitreous detachment of left eye 04/03/2020   Chronic secretory otitis media of right ear 09/23/2019   Sensorineural hearing loss (SNHL) of left ear with restricted hearing of right ear 06/09/2019   Multinodular goiter 11/07/2016   Neck pain 01/16/2015   Aortic stenosis 01/06/2014   Essential hypertension, benign 01/06/2014   Family history of coronary artery disease 01/06/2014   Diabetes ([place]) 01/06/2014   Chronic low back pain 07/05/2013   Displacement of lumbar intervertebral disc without myelopathy 07/05/2013   Lumbosacral radiculitis 07/05/2013   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 HEP is going well.  Pt accompanied by: self  PERTINENT HISTORY: See above; bulging disk in lumbar spine  PAIN:  Are you having  pain? Yes: NPRS scale: 3/10 Pain location: from neck to LB Pain description: soreness, sometimes sharp pain Aggravating factors: sitting or standing too long Relieving factors: exercises help  PRECAUTIONS: Fall  WEIGHT BEARING RESTRICTIONS No  FALLS: Has patient fallen in last 6 months? No Reports she has come close-R foot catches  LIVING ENVIRONMENT: Lives with: lives alone Lives in: House/apartment Stairs: Yes: Internal: 8, 5 steps; on right going up Has following equipment at home: Single point cane, Walker - 2 wheeled, Environmental consultant - 4 wheeled, and walkers were given to her; she doesn't typically use as they are too heavy to transport  PLOF: Independent, Independent with household mobility without device, and Independent with community mobility with device  PATIENT GOALS Pt's goals for therapy is to improve strength and avoid limiting my activities due to weakness  OBJECTIVE:    TODAY'S TREATMENT: 05/24/22 Activity Comments  Nustep L4 x 6 min  Allowed pt to pick "moderate pace"  ant/pos wt shifts Edu and demo required to understand movement pattern  side to side wt shifts Good stability   Staggered ant/pos wt shift Good ability to coordinate movements; cueing to reduce UE support and to separate feet  alt backwards step 2x10 Started with B Ues and weaned to no UE support; cueing to widen BOS   alt toe tap on 6" step 2x10 Able to perform with CGA and no UE support; good stability but limited amplitude hip flexion   sidestepping with ankle 2# Without UE support on II bars; cueing for longer steps   short step ups on 6" 2x5 each LE B UE support and CGA: much more limited control on R LE but no pain  mini squat 2x10 Good technique; able to reduce UE support without peompting             HOME EXERCISE PROGRAM Last updated: 05/17/22 Access Code: [email or web address] URL: [email or web address] Date: 05/17/2022 Prepared by: [facility]  Program Notes Using your rollator walker, walk about 2-3 minutes in your hallway at home, focusing on taking long strides and standing up tall.  Exercises - Staggered Stance Forward Backward Weight Shift with Counter Support  -  1 x daily - 5 x weekly - 1-2 sets - 10 reps - Standing Hip Abduction with Resistance at Ankles and Counter Support  - 1 x daily - 5 x weekly - 2 sets - 10 reps - Marching with Resistance  - 1 x daily - 5 x weekly - 2 sets - 10 reps - Seated Ankle Dorsiflexion with Resistance  - 1 x daily - 5 x weekly - 2 sets - 10 reps    ------------------------------------------------------------------- (Objective measures below from evaluation findings):  DIAGNOSTIC FINDINGS: MRI cervical spine (05/07/2022) - At C4-5 disc bulging, facet and ligamentum flavum hypertrophy with moderate spinal stenosis and severe bilateral foraminal stenosis; no cord signal changes. - At C5-6 disc bulging and facet hypertrophy with moderate spinal stenosis and severe bilateral foraminal stenosis; no cord signal changes. - At C6-7 disc bulging and facet hypertrophy with mild spinal stenosis and severe bilateral foraminal stenosis. - At C3-4 disc bulging with severe right foraminal stenosis. lumbar spine  Lumbar spine MRI 02/18/2022: 1. Mild central canal stenosis L1-2 with mild progression since September 2022 MRI 2. Mild central canal stenosis L2-3. Mild progression of right subarticular stenosis L2-3 3. Mild central canal stenosis L3-4 with mild progression. Moderate right subarticular stenosis with mild progression 4. Moderate central canal stenosis L4-5 with moderate subarticular stenosis on the right, unchanged. 5. Mild anterolisthesis L5-S1 due to severe facet degeneration. No significant neural impingement.  COGNITION: Overall cognitive status: Within functional limits for tasks assessed   SENSATION: Light touch: WFL  EDEMA:  Noted BLEs-pt states this is not new  POSTURE:  rounded shoulders and forward head  LOWER EXTREMITY ROM:    R hip flexion slightly decreased against gravity in sitting; -10 degrees R ankle dflex, +8 degrees L ankle dflex  Active  Right Eval Left Eval  Hip flexion    Hip extension    Hip abduction    Hip adduction    Hip internal rotation    Hip external rotation    Knee flexion    Knee extension    Ankle dorsiflexion    Ankle plantarflexion    Ankle inversion    Ankle eversion     (Blank rows = not tested)  LOWER EXTREMITY MMT:    MMT Right Eval Left Eval  Hip flexion 3+ 4  Hip extension    Hip abduction 4 4  Hip adduction 4 4  Hip internal rotation    Hip external rotation    Knee flexion 3+ 4  Knee extension 4 4  Ankle dorsiflexion 3- 3+  Ankle plantarflexion    Ankle inversion    Ankle eversion    (Blank rows = not tested)  TRANSFERS: Assistive device utilized:  able to stand without UE support, but uses backs of legs at chair arms forward for momentum and None  Sit to stand: SBA Stand to sit: SBA  GAIT: Gait pattern: step through pattern, decreased step length- Right, decreased step length- Left, decreased stride length, decreased ankle dorsiflexion- Right, wide BOS, and poor foot clearance- Right Distance walked: 40 ft x 2 Assistive device utilized: Single point cane Level of assistance: SBA Comments: Decreased hip stability noted with gait-Trendelenburg pattern.  Brief trial of rollator-pt able to take longer strides  FUNCTIONAL TESTs:  5 times sit to stand: 34.78 sec with hands at knees Timed up and go (TUG): 32.94 sec Single limb stance:  < 1 sec each leg Tandem stance;  RLE posterior 11.4 sec, LLE posterior 13.5 sec  TODAY'S TREATMENT:  Initiated HEP-see below   PATIENT EDUCATION: Education details: PT POC, eval results, initial HEP Person educated: Patient Education method: Explanation, Demonstration, and Handouts Education comprehension: verbalized understanding, returned demonstration,  and needs further education   HOME EXERCISE PROGRAM: Access Code: [email or web address] URL: [email or web address] Date: 05/09/2022 Prepared by: [facility]  Program Notes Using your rollator walker, walk about 2-3 minutes in your hallway at home, focusing on taking long strides and standing up tall.  Exercises - Staggered Stance Forward Backward Weight Shift with Counter Support  - 1 x daily - 5 x weekly - 1-2 sets - 10 reps - Standing Hip Abduction with Counter Support  - 1 x daily - 5 x weekly - 1-2 sets - 10 reps - Standing March with Counter Support  - 1 x daily - 5 x weekly - 1-2 sets - 10 reps    GOALS: Goals reviewed with patient? Yes  SHORT TERM GOALS: Target date: 06/07/2022  Pt will be independent with HEP for improved strength, balance, gait. Baseline: Goal status: IN PROGRESS  2.  Pt will improve 5x sit<>stand to less than or equal to 25 sec to demonstrate improved functional strength and transfer efficiency.  Baseline: 34.78 sec Goal status: IN PROGRESS  3.  Pt will improve TUG score to less than or equal to 25 sec for decreased fall risk. Baseline: 32.94 sec Goal status: IN PROGRESS   LONG TERM GOALS: Target date: 06/21/2022  Pt will be independent with progression of HEP for improved strength, balance, gait.  Baseline:  Goal status: IN PROGRESS  2.  Pt will improve 5x sit<>stand to less than or equal to 20 sec to demonstrate improved functional strength and transfer efficiency. Baseline: 34.78 sec Goal status:IN PROGRESS  3.  Pt will improve TUG score to less than or equal to 20 sec for decreased fall risk. Baseline: 32.94 Goal status: IN PROGRESS  4.  Patient to demonstrate gait speed of 1.8 ft/sec in order to improve safe access to community.   Baseline: 1.3 ft/sec) Goal status: IN PROGRESS  5.  2MWT to be assessed and goal to be written as appropriate.  Improve 2WMT distance by at least 40 ft for improved  gait efficiency and safety. Baseline: 160 ft 05/13/2022 Goal status: IN PROGRESS  ASSESSMENT:  CLINICAL IMPRESSION: Patient arrived to session without new complaints. Worked on balance activities in parallel bars for max safety and patient confidence. Patient demonstrated good effort to wean UE support as able with most balance activities today. Intermittent d/o R LE weakness/knee buckling required short seated rest breaks. R LE weakness was most prominent with step ups today, revealing limited eccentric control. Patient demonstrated good effort with today's session and without complaints upon leaving.   OBJECTIVE IMPAIRMENTS Abnormal gait, decreased balance, decreased mobility, difficulty walking, decreased ROM, decreased strength, and impaired flexibility.   ACTIVITY LIMITATIONS standing, transfers, and locomotion level  PARTICIPATION LIMITATIONS: shopping, community activity, and church  PERSONAL FACTORS 3+ comorbidities: see above-note extensive lumbar/cervical involvement (has already had PT this year for lumbar PT)  are also affecting patient's functional outcome.   REHAB POTENTIAL: Good  CLINICAL DECISION MAKING: Evolving/moderate complexity  EVALUATION COMPLEXITY: Moderate  PLAN: PT FREQUENCY: 2x/week  PT DURATION: 6 weeks plus eval visit  PLANNED INTERVENTIONS: Therapeutic exercises, Therapeutic activity, Neuromuscular re-education, Balance training, Gait training, Patient/Family education, Self Care, Orthotic/Fit training, DME instructions, and Manual therapy  PLAN FOR NEXT SESSION: Discuss pt's thoughts on foot-up  brace/ AFO; Work on ankle and hip strength.  Continue to practice rollator for more stable gait.      Janene Harvey, PT, DPT 05/24/22 11:43 AM  [facility] [address] [place], [place], [place] [address] Phone # [phone] Fax # [phone]

## 2022-05-24 ENCOUNTER — Ambulatory Visit: Payer: Medicare Other | Attending: Neurology | Admitting: Physical Therapy

## 2022-05-24 ENCOUNTER — Encounter: Payer: Self-pay | Admitting: Physical Therapy

## 2022-05-24 DIAGNOSIS — R2689 Other abnormalities of gait and mobility: Secondary | ICD-10-CM | POA: Insufficient documentation

## 2022-05-24 DIAGNOSIS — R2681 Unsteadiness on feet: Secondary | ICD-10-CM | POA: Diagnosis not present

## 2022-05-24 DIAGNOSIS — M6281 Muscle weakness (generalized): Secondary | ICD-10-CM | POA: Insufficient documentation

## 2022-05-24 NOTE — Therapy (Signed)
OUTPATIENT PHYSICAL THERAPY NEURO TREATMENT NOTE   Patient Name: Lisa [name] MRN: [id number] DOB:Mar 31, 1936, 86 y.o., female Today's Date: 05/27/2022   PCP: Lawerance Cruel, MD REFERRING PROVIDER: Marcial Pacas, MD   PT End of Session - 05/27/22 1058     Visit Number 6    Number of Visits 13    Date for PT Re-Evaluation 06/21/22    Authorization Type Medicare/[employer]    PT Start Time 1012    PT Stop Time 1057    PT Time Calculation (min) 45 min    Equipment Utilized During Treatment Gait belt    Activity Tolerance Patient tolerated treatment well    Behavior During Therapy WFL for tasks assessed/performed                 Past Medical History:  Diagnosis Date   Arthritis    DDD (degenerative disc disease), cervical    Deafness in right ear    Diverticulosis    Hearing loss in left ear    Heart murmur    Kidney stones    Psoriasis    Past Surgical History:  Procedure Laterality Date   APPENDECTOMY     BILATERAL SALPINGOOPHORECTOMY  07/22/1994   CATARACT EXTRACTION     CHOLECYSTECTOMY  07/22/2000   COLONOSCOPY  2003/2009/2015   gamma knife     TOTAL ABDOMINAL HYSTERECTOMY  07/22/1977   FIBROIDS    Patient Active Problem List   Diagnosis Date Noted   Cervical spondylosis 05/14/2022   Gait abnormality 04/30/2022   Advanced nonexudative age-related macular degeneration of both eyes without subfoveal involvement 04/04/2022   Peripheral neuropathy 12/18/2021   Anxiety 09/27/2020   Aortic valve disorder 09/27/2020   Arthritis 09/27/2020   Hyperthyroidism 09/27/2020   Thyromegaly 09/27/2020   Body mass index (BMI) 36.0-36.9, adult 09/27/2020   History of radiation exposure 09/27/2020   Idiopathic kyphoscoliosis 09/27/2020   Macular degeneration 09/27/2020   Other seasonal allergic rhinitis 09/27/2020   Unsteady gait 09/27/2020   Arthritis, multiple joint involvement 08/21/2020   Atrophy of vagina 08/21/2020   Gastro-esophageal reflux disease without  esophagitis 08/21/2020   Generalized anxiety disorder 08/21/2020   Mixed hyperlipidemia 08/21/2020   Osteoporosis 08/21/2020   Perennial allergic rhinitis with seasonal variation 08/21/2020   Psoriatic arthritis ([place]) 08/21/2020   Vitamin D deficiency 08/21/2020   Body mass index (BMI) 35.0-35.9, adult 05/08/2020   Lumbago with sciatica, right side 05/08/2020   Anterolisthesis of lumbar spine 05/08/2020   Other chronic pain 05/08/2020   Intermediate stage nonexudative age-related macular degeneration of both eyes 04/03/2020   Posterior vitreous detachment of right eye 04/03/2020   Posterior vitreous detachment of left eye 04/03/2020   Chronic secretory otitis media of right ear 09/23/2019   Sensorineural hearing loss (SNHL) of left ear with restricted hearing of right ear 06/09/2019   Multinodular goiter 11/07/2016   Neck pain 01/16/2015   Aortic stenosis 01/06/2014   Essential hypertension, benign 01/06/2014   Family history of coronary artery disease 01/06/2014   Diabetes ([place]) 01/06/2014   Chronic low back pain 07/05/2013   Displacement of lumbar intervertebral disc without myelopathy 07/05/2013   Lumbosacral radiculitis 07/05/2013   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 Noticed the same tightness in the R leg as before.  Pt accompanied by: self  PERTINENT HISTORY: See above; bulging disk in lumbar spine  PAIN:   Are you having pain? Yes: NPRS scale: 3-4/10 Pain location: from neck to LB Pain description: soreness, sometimes sharp pain Aggravating factors: sitting or standing too long Relieving factors: exercises help  PRECAUTIONS: Fall  WEIGHT BEARING RESTRICTIONS No  FALLS: Has patient fallen in last 6 months? No Reports she has come close-R foot catches  LIVING ENVIRONMENT: Lives with: lives alone Lives in: House/apartment Stairs: Yes: Internal: 8, 5 steps; on right going up Has following equipment at home: Single point cane, Walker - 2 wheeled, Environmental consultant - 4 wheeled, and walkers were given to her; she doesn't typically use as they are too heavy to transport  PLOF: Independent, Independent with household mobility without device, and Independent with community mobility with device  PATIENT GOALS Pt's goals for therapy is to improve strength and avoid limiting my activities due to weakness  OBJECTIVE:     TODAY'S TREATMENT: 05/27/22 Activity Comments  Nustep L4 x 6 min Ues/LEs Cues to maintain at least 55 SPM  Supine HS stretch with strap 2x30" PT assist; c/o sensation of LB pressure   R mod thomas stretch 2x30" with strap 2x30  Edu on expected sensation and reasoning behind this stretch  R quad set 10x5"  Good tolerance  R SLR 2x5 Limited ROM and eccentric control; no quad lag  Supine LTR x20 To tolerance   gait with rollator 200 ft Limited foot clearance d/t hip flexor wekaness; edu and practice using brakes for transfers   stair navigation with B/single handrail with cane L-LE dominant pattern; edu on proper use of cane with CGA            PATIENT EDUCATION: Education details: encouraged use of 4WW outside and instructed to keep it in her car for use out in the community rather than having to carry walker up/down stairs  Person educated: Patient Education method: Explanation, Demonstration, Tactile cues, and Verbal cues Education comprehension: verbalized understanding and  returned demonstration     HOME EXERCISE PROGRAM Last updated: 05/17/22 Access Code: [email or web address] URL: [email or web address] Date: 05/17/2022 Prepared by: [facility]  Program Notes Using your rollator walker, walk about 2-3 minutes in  your hallway at home, focusing on taking long strides and standing up tall.  Exercises - Staggered Stance Forward Backward Weight Shift with Counter Support  - 1 x daily - 5 x weekly - 1-2 sets - 10 reps - Standing Hip Abduction with Resistance at Ankles and Counter Support  - 1 x daily - 5 x weekly - 2 sets - 10 reps - Marching with Resistance  - 1 x daily - 5 x weekly - 2 sets - 10 reps - Seated Ankle Dorsiflexion with Resistance  - 1 x daily - 5 x weekly - 2 sets - 10 reps    ------------------------------------------------------------------- (Objective measures below from evaluation findings):  DIAGNOSTIC FINDINGS: MRI cervical spine (05/07/2022) - At C4-5 disc bulging, facet and ligamentum flavum hypertrophy with moderate spinal stenosis and severe bilateral foraminal stenosis; no cord signal changes. - At C5-6 disc bulging and facet hypertrophy with moderate spinal stenosis and severe bilateral foraminal stenosis; no cord signal changes. - At C6-7 disc bulging and facet hypertrophy with mild spinal stenosis and severe bilateral foraminal stenosis. - At C3-4 disc bulging with severe right foraminal stenosis. lumbar spine  Lumbar spine MRI 02/18/2022: 1. Mild central canal stenosis L1-2 with mild progression since September 2022 MRI 2. Mild central canal stenosis L2-3. Mild progression of right subarticular stenosis L2-3 3. Mild central canal stenosis L3-4 with mild progression. Moderate right subarticular stenosis with mild progression 4. Moderate central canal stenosis L4-5 with moderate subarticular stenosis on the right, unchanged. 5. Mild anterolisthesis L5-S1 due to severe facet degeneration.  No significant neural impingement.  COGNITION: Overall cognitive status: Within functional limits for tasks assessed   SENSATION: Light touch: WFL  EDEMA:  Noted BLEs-pt states this is not new  POSTURE: rounded shoulders and forward head  LOWER EXTREMITY ROM:    R hip flexion slightly decreased against gravity in sitting; -10 degrees R ankle dflex, +8 degrees L ankle dflex  Active  Right Eval Left Eval  Hip flexion    Hip extension    Hip abduction    Hip adduction    Hip internal rotation    Hip external rotation    Knee flexion    Knee extension    Ankle dorsiflexion    Ankle plantarflexion    Ankle inversion    Ankle eversion     (Blank rows = not tested)  LOWER EXTREMITY MMT:    MMT Right Eval Left Eval  Hip flexion 3+ 4  Hip extension    Hip abduction 4 4  Hip adduction 4 4  Hip internal rotation    Hip external rotation    Knee flexion 3+ 4  Knee extension 4 4  Ankle dorsiflexion 3- 3+  Ankle plantarflexion    Ankle inversion    Ankle eversion    (Blank rows = not tested)  TRANSFERS: Assistive device utilized:  able to stand without UE support, but uses backs of legs at chair arms forward for momentum and None  Sit to stand: SBA Stand to sit: SBA  GAIT: Gait pattern: step through pattern, decreased step length- Right, decreased step length- Left, decreased stride length, decreased ankle dorsiflexion- Right, wide BOS, and poor foot clearance- Right Distance walked: 40 ft x 2 Assistive device utilized: Single point cane Level of assistance: SBA Comments: Decreased hip stability noted with gait-Trendelenburg pattern.  Brief trial of rollator-pt able to take longer strides  FUNCTIONAL TESTs:  5 times sit to stand: 34.78 sec with hands at knees Timed  up and go (TUG): 32.94 sec Single limb stance:  < 1 sec each leg Tandem stance;  RLE posterior 11.4 sec, LLE posterior 13.5 sec  TODAY'S TREATMENT:  Initiated HEP-see below   PATIENT  EDUCATION: Education details: PT POC, eval results, initial HEP Person educated: Patient Education method: Explanation, Demonstration, and Handouts Education comprehension: verbalized understanding, returned demonstration, and needs further education   HOME EXERCISE PROGRAM: Access Code: [email or web address] URL: [email or web address] Date: 05/09/2022 Prepared by: [facility]  Program Notes Using your rollator walker, walk about 2-3 minutes in your hallway at home, focusing on taking long strides and standing up tall.  Exercises - Staggered Stance Forward Backward Weight Shift with Counter Support  - 1 x daily - 5 x weekly - 1-2 sets - 10 reps - Standing Hip Abduction with Counter Support  - 1 x daily - 5 x weekly - 1-2 sets - 10 reps - Standing March with Counter Support  - 1 x daily - 5 x weekly - 1-2 sets - 10 reps    GOALS: Goals reviewed with patient? Yes  SHORT TERM GOALS: Target date: 06/07/2022  Pt will be independent with HEP for improved strength, balance, gait. Baseline: Goal status: IN PROGRESS  2.  Pt will improve 5x sit<>stand to less than or equal to 25 sec to demonstrate improved functional strength and transfer efficiency.  Baseline: 34.78 sec Goal status: IN PROGRESS  3.  Pt will improve TUG score to less than or equal to 25 sec for decreased fall risk. Baseline: 32.94 sec Goal status: IN PROGRESS   LONG TERM GOALS: Target date: 06/21/2022  Pt will be independent with progression of HEP for improved strength, balance, gait.  Baseline:  Goal status: IN PROGRESS  2.  Pt will improve 5x sit<>stand to less than or equal to 20 sec to demonstrate improved functional strength and transfer efficiency. Baseline: 34.78 sec Goal status:IN PROGRESS  3.  Pt will improve TUG score to less than or equal to 20 sec for decreased fall risk. Baseline: 32.94 Goal status: IN PROGRESS  4.  Patient to demonstrate gait speed of  1.8 ft/sec in order to improve safe access to community.   Baseline: 1.3 ft/sec) Goal status: IN PROGRESS  5.  2MWT to be assessed and goal to be written as appropriate.  Improve 2WMT distance by at least 40 ft for improved gait efficiency and safety. Baseline: 160 ft 05/13/2022 Goal status: IN PROGRESS  ASSESSMENT:  CLINICAL IMPRESSION: Patient arrived to session with report of continued sensation of tightness in the R LE. Addressed this complaint with gentle LE stretching, which patient reported relief with. Proceeded with mat quad strengthening to address weakness in the R LE. Patient responded well to cues for form, but required limited reps d/t sensation of LB "pressure." Worked on gait training with [phone] as this provides improved stability and discussed safe way for patient to be able to use this device out in the community rather than cane. Patient agreeable and without complaints at end o session.   OBJECTIVE IMPAIRMENTS Abnormal gait, decreased balance, decreased mobility, difficulty walking, decreased ROM, decreased strength, and impaired flexibility.   ACTIVITY LIMITATIONS standing, transfers, and locomotion level  PARTICIPATION LIMITATIONS: shopping, community activity, and church  PERSONAL FACTORS 3+ comorbidities: see above-note extensive lumbar/cervical involvement (has already had PT this year for lumbar PT)  are also affecting patient's functional outcome.   REHAB POTENTIAL: Good  CLINICAL DECISION MAKING: Evolving/moderate complexity  EVALUATION COMPLEXITY: Moderate  PLAN: PT FREQUENCY: 2x/week  PT DURATION: 6 weeks plus eval visit  PLANNED INTERVENTIONS: Therapeutic exercises, Therapeutic activity, Neuromuscular re-education, Balance training, Gait training, Patient/Family education, Self Care, Orthotic/Fit training, DME instructions, and Manual therapy  PLAN FOR NEXT SESSION: Work on ankle and hip strength.  Continue to practice rollator for more stable gait.       Janene Harvey, PT, DPT 05/27/22 11:00 AM  [facility] [address] [place], [place], [place] [address] Phone # [phone] Fax # [phone]

## 2022-05-27 ENCOUNTER — Encounter: Payer: Self-pay | Admitting: Physical Therapy

## 2022-05-27 ENCOUNTER — Ambulatory Visit: Payer: Medicare Other | Admitting: Physical Therapy

## 2022-05-27 DIAGNOSIS — M6281 Muscle weakness (generalized): Secondary | ICD-10-CM | POA: Diagnosis not present

## 2022-05-27 DIAGNOSIS — R2681 Unsteadiness on feet: Secondary | ICD-10-CM | POA: Diagnosis not present

## 2022-05-27 DIAGNOSIS — R2689 Other abnormalities of gait and mobility: Secondary | ICD-10-CM | POA: Diagnosis not present

## 2022-05-28 NOTE — Therapy (Incomplete)
OUTPATIENT PHYSICAL THERAPY NEURO TREATMENT NOTE   Patient Name: Lisa [name] MRN: [id number] DOB:[birth year], 86 y.o., female Today's Date: 05/27/2022   PCP: Lawerance Cruel, MD REFERRING PROVIDER: Marcial Pacas, MD   PT End of Session - 05/27/22 1058     Visit Number 6    Number of Visits 13    Date for PT Re-Evaluation 06/21/22    Authorization Type Medicare/[employer]    PT Start Time 1012    PT Stop Time 1057    PT Time Calculation (min) 45 min    Equipment Utilized During Treatment Gait belt    Activity Tolerance Patient tolerated treatment well    Behavior During Therapy WFL for tasks assessed/performed                 Past Medical History:  Diagnosis Date   Arthritis    DDD (degenerative disc disease), cervical    Deafness in right ear    Diverticulosis    Hearing loss in left ear    Heart murmur    Kidney stones    Psoriasis    Past Surgical History:  Procedure Laterality Date   APPENDECTOMY     BILATERAL SALPINGOOPHORECTOMY  07/22/1994   CATARACT EXTRACTION     CHOLECYSTECTOMY  07/22/2000   COLONOSCOPY  2003/2009/2015   gamma knife     TOTAL ABDOMINAL HYSTERECTOMY  07/22/1977   FIBROIDS    Patient Active Problem List   Diagnosis Date Noted   Cervical spondylosis 05/14/2022   Gait abnormality 04/30/2022   Advanced nonexudative age-related macular degeneration of both eyes without subfoveal involvement 04/04/2022   Peripheral neuropathy 12/18/2021   Anxiety 09/27/2020   Aortic valve disorder 09/27/2020   Arthritis 09/27/2020   Hyperthyroidism 09/27/2020   Thyromegaly 09/27/2020   Body mass index (BMI) 36.0-36.9, adult 09/27/2020   History of radiation exposure 09/27/2020   Idiopathic kyphoscoliosis 09/27/2020   Macular degeneration 09/27/2020   Other seasonal allergic rhinitis 09/27/2020   Unsteady gait 09/27/2020   Arthritis, multiple joint involvement 08/21/2020   Atrophy of vagina 08/21/2020   Gastro-esophageal reflux disease without  esophagitis 08/21/2020   Generalized anxiety disorder 08/21/2020   Mixed hyperlipidemia 08/21/2020   Osteoporosis 08/21/2020   Perennial allergic rhinitis with seasonal variation 08/21/2020   Psoriatic arthritis ([place]) 08/21/2020   Vitamin D deficiency 08/21/2020   Body mass index (BMI) 35.0-35.9, adult 05/08/2020   Lumbago with sciatica, right side 05/08/2020   Anterolisthesis of lumbar spine 05/08/2020   Other chronic pain 05/08/2020   Intermediate stage nonexudative age-related macular degeneration of both eyes 04/03/2020   Posterior vitreous detachment of right eye 04/03/2020   Posterior vitreous detachment of left eye 04/03/2020   Chronic secretory otitis media of right ear 09/23/2019   Sensorineural hearing loss (SNHL) of left ear with restricted hearing of right ear 06/09/2019   Multinodular goiter 11/07/2016   Neck pain 01/16/2015   Aortic stenosis 01/06/2014   Essential hypertension, benign 01/06/2014   Family history of coronary artery disease 01/06/2014   Diabetes ([place]) 01/06/2014   Chronic low back pain 07/05/2013   Displacement of lumbar intervertebral disc without myelopathy 07/05/2013   Lumbosacral radiculitis 07/05/2013   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 Noticed the same tightness in the R leg as before.  Pt accompanied by: self  PERTINENT HISTORY: See above; bulging disk in lumbar spine  PAIN:   Are you having pain? Yes: NPRS scale: 3-4/10 Pain location: from neck to LB Pain description: soreness, sometimes sharp pain Aggravating factors: sitting or standing too long Relieving factors: exercises help  PRECAUTIONS: Fall  WEIGHT BEARING RESTRICTIONS No  FALLS: Has patient fallen in last 6 months? No Reports she has come close-R foot catches  LIVING ENVIRONMENT: Lives with: lives alone Lives in: House/apartment Stairs: Yes: Internal: 8, 5 steps; on right going up Has following equipment at home: Single point cane, Walker - 2 wheeled, Environmental consultant - 4 wheeled, and walkers were given to her; she doesn't typically use as they are too heavy to transport  PLOF: Independent, Independent with household mobility without device, and Independent with community mobility with device  PATIENT GOALS Pt's goals for therapy is to improve strength and avoid limiting my activities due to weakness  OBJECTIVE:    TODAY'S TREATMENT: 05/29/22 Activity Comments                          HOME EXERCISE PROGRAM Last updated: 05/17/22 Access Code: [email or web address] URL: [email or web address] Date: 05/17/2022 Prepared by: [facility]  Program Notes Using your rollator walker, walk about 2-3 minutes in your hallway at home, focusing on taking long strides and standing up tall.  Exercises - Staggered Stance Forward Backward Weight Shift with Counter Support  - 1 x daily - 5 x weekly - 1-2 sets - 10 reps - Standing Hip Abduction with Resistance at Ankles and Counter Support  - 1 x daily - 5 x weekly - 2 sets - 10 reps - Marching with Resistance  - 1 x daily - 5 x weekly - 2 sets - 10 reps - Seated Ankle Dorsiflexion with Resistance  - 1 x daily - 5 x weekly - 2 sets - 10 reps    ------------------------------------------------------------------- (Objective measures below from evaluation findings):  DIAGNOSTIC FINDINGS: MRI cervical spine  (05/07/2022) - At C4-5 disc bulging, facet and ligamentum flavum hypertrophy with moderate spinal stenosis and severe bilateral foraminal stenosis; no cord signal changes. - At C5-6 disc bulging and facet hypertrophy with moderate spinal  stenosis and severe bilateral foraminal stenosis; no cord signal changes. - At C6-7 disc bulging and facet hypertrophy with mild spinal stenosis and severe bilateral foraminal stenosis. - At C3-4 disc bulging with severe right foraminal stenosis. lumbar spine  Lumbar spine MRI 02/18/2022: 1. Mild central canal stenosis L1-2 with mild progression since September 2022 MRI 2. Mild central canal stenosis L2-3. Mild progression of right subarticular stenosis L2-3 3. Mild central canal stenosis L3-4 with mild progression. Moderate right subarticular stenosis with mild progression 4. Moderate central canal stenosis L4-5 with moderate subarticular stenosis on the right, unchanged. 5. Mild anterolisthesis L5-S1 due to severe facet degeneration. No significant neural impingement.  COGNITION: Overall cognitive status: Within functional limits for tasks assessed   SENSATION: Light touch: WFL  EDEMA:  Noted BLEs-pt states this is not new  POSTURE: rounded shoulders and forward head  LOWER EXTREMITY ROM:    R hip flexion slightly decreased against gravity in sitting; -10 degrees R ankle dflex, +8 degrees L ankle dflex  Active  Right Eval Left Eval  Hip flexion    Hip extension    Hip abduction    Hip adduction    Hip internal rotation    Hip external rotation    Knee flexion    Knee extension    Ankle dorsiflexion    Ankle plantarflexion    Ankle inversion    Ankle eversion     (Blank rows = not tested)  LOWER EXTREMITY MMT:    MMT Right Eval Left Eval  Hip flexion 3+ 4  Hip extension    Hip abduction 4 4  Hip adduction 4 4  Hip internal rotation    Hip external rotation    Knee flexion 3+ 4  Knee extension 4 4  Ankle dorsiflexion 3- 3+   Ankle plantarflexion    Ankle inversion    Ankle eversion    (Blank rows = not tested)  TRANSFERS: Assistive device utilized:  able to stand without UE support, but uses backs of legs at chair arms forward for momentum and None  Sit to stand: SBA Stand to sit: SBA  GAIT: Gait pattern: step through pattern, decreased step length- Right, decreased step length- Left, decreased stride length, decreased ankle dorsiflexion- Right, wide BOS, and poor foot clearance- Right Distance walked: 40 ft x 2 Assistive device utilized: Single point cane Level of assistance: SBA Comments: Decreased hip stability noted with gait-Trendelenburg pattern.  Brief trial of rollator-pt able to take longer strides  FUNCTIONAL TESTs:  5 times sit to stand: 34.78 sec with hands at knees Timed up and go (TUG): 32.94 sec Single limb stance:  < 1 sec each leg Tandem stance;  RLE posterior 11.4 sec, LLE posterior 13.5 sec  TODAY'S TREATMENT:  Initiated HEP-see below   PATIENT EDUCATION: Education details: PT POC, eval results, initial HEP Person educated: Patient Education method: Explanation, Demonstration, and Handouts Education comprehension: verbalized understanding, returned demonstration, and needs further education   HOME EXERCISE PROGRAM: Access Code: [email or web address] URL: [email or web address] Date: 05/09/2022 Prepared by: [facility]  Program Notes Using your rollator walker, walk about 2-3 minutes in your hallway at home, focusing on taking long strides and standing up tall.  Exercises - Staggered Stance Forward Backward Weight Shift with Counter Support  - 1 x daily - 5 x weekly - 1-2 sets - 10 reps - Standing Hip Abduction with Counter Support  - 1 x daily - 5 x weekly - 1-2  sets - 10 reps - Standing March with Counter Support  - 1 x daily - 5 x weekly - 1-2 sets - 10 reps    GOALS: Goals reviewed with patient? Yes  SHORT TERM GOALS: Target  date: 06/07/2022  Pt will be independent with HEP for improved strength, balance, gait. Baseline: Goal status: IN PROGRESS  2.  Pt will improve 5x sit<>stand to less than or equal to 25 sec to demonstrate improved functional strength and transfer efficiency.  Baseline: 34.78 sec Goal status: IN PROGRESS  3.  Pt will improve TUG score to less than or equal to 25 sec for decreased fall risk. Baseline: 32.94 sec Goal status: IN PROGRESS   LONG TERM GOALS: Target date: 06/21/2022  Pt will be independent with progression of HEP for improved strength, balance, gait.  Baseline:  Goal status: IN PROGRESS  2.  Pt will improve 5x sit<>stand to less than or equal to 20 sec to demonstrate improved functional strength and transfer efficiency. Baseline: 34.78 sec Goal status:IN PROGRESS  3.  Pt will improve TUG score to less than or equal to 20 sec for decreased fall risk. Baseline: 32.94 Goal status: IN PROGRESS  4.  Patient to demonstrate gait speed of 1.8 ft/sec in order to improve safe access to community.   Baseline: 1.3 ft/sec) Goal status: IN PROGRESS  5.  2MWT to be assessed and goal to be written as appropriate.  Improve 2WMT distance by at least 40 ft for improved gait efficiency and safety. Baseline: 160 ft 05/13/2022 Goal status: IN PROGRESS  ASSESSMENT:  CLINICAL IMPRESSION: Patient arrived to session with report of continued sensation of tightness in the R LE. Addressed this complaint with gentle LE stretching, which patient reported relief with. Proceeded with mat quad strengthening to address weakness in the R LE. Patient responded well to cues for form, but required limited reps d/t sensation of LB "pressure." Worked on gait training with [phone] as this provides improved stability and discussed safe way for patient to be able to use this device out in the community rather than cane. Patient agreeable and without complaints at end o session.   OBJECTIVE IMPAIRMENTS Abnormal  gait, decreased balance, decreased mobility, difficulty walking, decreased ROM, decreased strength, and impaired flexibility.   ACTIVITY LIMITATIONS standing, transfers, and locomotion level  PARTICIPATION LIMITATIONS: shopping, community activity, and church  PERSONAL FACTORS 3+ comorbidities: see above-note extensive lumbar/cervical involvement (has already had PT this year for lumbar PT)  are also affecting patient's functional outcome.   REHAB POTENTIAL: Good  CLINICAL DECISION MAKING: Evolving/moderate complexity  EVALUATION COMPLEXITY: Moderate  PLAN: PT FREQUENCY: 2x/week  PT DURATION: 6 weeks plus eval visit  PLANNED INTERVENTIONS: Therapeutic exercises, Therapeutic activity, Neuromuscular re-education, Balance training, Gait training, Patient/Family education, Self Care, Orthotic/Fit training, DME instructions, and Manual therapy  PLAN FOR NEXT SESSION: Work on ankle and hip strength.  Continue to practice rollator for more stable gait.      Janene Harvey, PT, DPT 05/27/22 11:00 AM  [facility] at [facility] [address] [place], [place], [place] [address] Phone # [phone] Fax # [phone]

## 2022-05-29 ENCOUNTER — Ambulatory Visit: Payer: Medicare Other | Admitting: Physical Therapy

## 2022-06-03 ENCOUNTER — Encounter: Payer: Self-pay | Admitting: Physical Therapy

## 2022-06-03 ENCOUNTER — Ambulatory Visit: Payer: Medicare Other | Admitting: Physical Therapy

## 2022-06-03 DIAGNOSIS — M6281 Muscle weakness (generalized): Secondary | ICD-10-CM | POA: Diagnosis not present

## 2022-06-03 DIAGNOSIS — R2689 Other abnormalities of gait and mobility: Secondary | ICD-10-CM

## 2022-06-03 DIAGNOSIS — R2681 Unsteadiness on feet: Secondary | ICD-10-CM | POA: Diagnosis not present

## 2022-06-03 NOTE — Therapy (Signed)
OUTPATIENT PHYSICAL THERAPY NEURO TREATMENT NOTE   Patient Name: Lisa [name] MRN: [id number] DOB:1935/12/25, 86 y.o., female Today's Date: 06/03/2022   PCP: Lawerance Cruel, MD REFERRING PROVIDER: Marcial Pacas, MD   PT End of Session - 06/03/22 1108     Visit Number 7    Number of Visits 13    Date for PT Re-Evaluation 06/21/22    Authorization Type Medicare/[employer]    PT Start Time 58    PT Stop Time 1144    PT Time Calculation (min) 38 min    Equipment Utilized During Treatment Gait belt    Activity Tolerance Patient tolerated treatment well    Behavior During Therapy WFL for tasks assessed/performed                 Past Medical History:  Diagnosis Date   Arthritis    DDD (degenerative disc disease), cervical    Deafness in right ear    Diverticulosis    Hearing loss in left ear    Heart murmur    Kidney stones    Psoriasis    Past Surgical History:  Procedure Laterality Date   APPENDECTOMY     BILATERAL SALPINGOOPHORECTOMY  07/22/1994   CATARACT EXTRACTION     CHOLECYSTECTOMY  07/22/2000   COLONOSCOPY  2003/2009/2015   gamma knife     TOTAL ABDOMINAL HYSTERECTOMY  07/22/1977   FIBROIDS    Patient Active Problem List   Diagnosis Date Noted   Cervical spondylosis 05/14/2022   Gait abnormality 04/30/2022   Advanced nonexudative age-related macular degeneration of both eyes without subfoveal involvement 04/04/2022   Peripheral neuropathy 12/18/2021   Anxiety 09/27/2020   Aortic valve disorder 09/27/2020   Arthritis 09/27/2020   Hyperthyroidism 09/27/2020   Thyromegaly 09/27/2020   Body mass index (BMI) 36.0-36.9, adult 09/27/2020   History of radiation exposure 09/27/2020   Idiopathic kyphoscoliosis 09/27/2020   Macular degeneration 09/27/2020   Other seasonal allergic rhinitis 09/27/2020   Unsteady gait 09/27/2020   Arthritis, multiple joint involvement 08/21/2020   Atrophy of vagina 08/21/2020   Gastro-esophageal reflux disease without  esophagitis 08/21/2020   Generalized anxiety disorder 08/21/2020   Mixed hyperlipidemia 08/21/2020   Osteoporosis 08/21/2020   Perennial allergic rhinitis with seasonal variation 08/21/2020   Psoriatic arthritis ([place]) 08/21/2020   Vitamin D deficiency 08/21/2020   Body mass index (BMI) 35.0-35.9, adult 05/08/2020   Lumbago with sciatica, right side 05/08/2020   Anterolisthesis of lumbar spine 05/08/2020   Other chronic pain 05/08/2020   Intermediate stage nonexudative age-related macular degeneration of both eyes 04/03/2020   Posterior vitreous detachment of right eye 04/03/2020   Posterior vitreous detachment of left eye 04/03/2020   Chronic secretory otitis media of right ear 09/23/2019   Sensorineural hearing loss (SNHL) of left ear with restricted hearing of right ear 06/09/2019   Multinodular goiter 11/07/2016   Neck pain 01/16/2015   Aortic stenosis 01/06/2014   Essential hypertension, benign 01/06/2014   Family history of coronary artery disease 01/06/2014   Diabetes ([place]) 01/06/2014   Chronic low back pain 07/05/2013   Displacement of lumbar intervertebral disc without myelopathy 07/05/2013   Lumbosacral radiculitis 07/05/2013   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  I do still have pain, but feel like the exercises are helping.   Pt accompanied by: self  PERTINENT HISTORY: See above; bulging disk in lumbar  spine  PAIN:  Are you having pain? Yes: NPRS scale: 4/10 Pain location: from neck to LB Pain description: soreness, sometimes sharp pain Aggravating factors: sitting or standing too long Relieving factors: exercises help  PRECAUTIONS: Fall  WEIGHT BEARING RESTRICTIONS No  FALLS: Has patient fallen in last 6 months? No Reports she has come close-R foot catches  LIVING ENVIRONMENT: Lives with: lives alone Lives in: House/apartment Stairs: Yes: Internal: 8, 5 steps; on right going up Has following equipment at home: Single point cane, Walker - 2 wheeled, Environmental consultant - 4 wheeled, and walkers were given to her; she doesn't typically use as they are too heavy to transport  PLOF: Independent, Independent with household mobility without device, and Independent with community mobility with device  PATIENT GOALS Pt's goals for therapy is to improve strength and avoid limiting my activities due to weakness  OBJECTIVE:    TODAY'S TREATMENT: 06/03/2022 Activity Comments  Seated exercises for warm-up -marching in place -LAQ -seated step out/in -sit to stand, 2 x 5 reps   Seated hip flexion with red theraband, 2 x 10 reps   Heel/toe raises 10 reps   Stagger stance rock forward and back, 10 reps For active ankle dorsiflexion  Gastroc stretch (runner's stretch position) 3 x 10-15 seconds each foot Cues for technique  Forward>back step and weightshift x 10 reps each leg BUE>1 UE support  Forward/back walk along counter x 3 reps Cues for stride length  Gait x 170 ft x 2 with rollator Cues for increased step length, foot clearance for heelstrike  Standing at locked rollator:  marching in place (knee to target for increased height) and forward kicks to target (for increased stance time)       Access Code: [email or web address] URL: [email or web address] Date: 06/03/2022-added to HEP Prepared by: [facility]  Program Notes Using your rollator walker,  walk about 2-3 minutes in your hallway at home, focusing on taking long strides and standing up tall.  Exercises - Staggered Stance Forward Backward Weight Shift with Counter Support  - 1 x daily - 5 x weekly - 1-2 sets - 10 reps - Standing Hip Abduction  with Resistance at Ankles and Counter Support  - 1 x daily - 5 x weekly - 2 sets - 10 reps - Marching with Resistance  - 1 x daily - 5 x weekly - 2 sets - 10 reps - Seated Ankle Dorsiflexion with Resistance  - 1 x daily - 5 x weekly - 2 sets - 10 reps - Staggered Stance Forward Backward Weight Shift with Counter Support  - 1-2 x daily - 7 x weekly - 1-2 sets - 10 reps  PATIENT EDUCATION: Education details: HEP addition Person educated: Patient Education method: Consulting civil engineer, Demonstration, and Handouts Education comprehension: verbalized understanding, returned demonstration, and needs further education     ------------------------------------------------------------------- (Objective measures below from evaluation findings):  DIAGNOSTIC FINDINGS: MRI cervical spine (05/07/2022) - At C4-5 disc bulging, facet and ligamentum flavum hypertrophy with moderate spinal stenosis and severe bilateral foraminal stenosis; no cord signal changes. - At C5-6 disc bulging and facet hypertrophy with moderate spinal stenosis and severe bilateral foraminal stenosis; no cord signal changes. - At C6-7 disc bulging and facet hypertrophy with mild spinal stenosis and severe bilateral foraminal stenosis. - At C3-4 disc bulging with severe right foraminal stenosis. lumbar spine  Lumbar spine MRI 02/18/2022: 1. Mild central canal stenosis L1-2 with mild progression since September 2022 MRI 2. Mild central canal stenosis L2-3. Mild progression of right subarticular stenosis L2-3 3. Mild central canal stenosis L3-4 with mild progression. Moderate right subarticular stenosis with mild progression 4. Moderate central canal stenosis L4-5 with moderate subarticular  stenosis on the right, unchanged. 5. Mild anterolisthesis L5-S1 due to severe facet degeneration. No significant neural impingement.  COGNITION: Overall cognitive status: Within functional limits for tasks assessed   SENSATION: Light touch: WFL  EDEMA:  Noted BLEs-pt states this is not new  POSTURE: rounded shoulders and forward head  LOWER EXTREMITY ROM:    R hip flexion slightly decreased against gravity in sitting; -10 degrees R ankle dflex, +8 degrees L ankle dflex  Active  Right Eval Left Eval  Hip flexion    Hip extension    Hip abduction    Hip adduction    Hip internal rotation    Hip external rotation    Knee flexion    Knee extension    Ankle dorsiflexion    Ankle plantarflexion    Ankle inversion    Ankle eversion     (Blank rows = not tested)  LOWER EXTREMITY MMT:    MMT Right Eval Left Eval  Hip flexion 3+ 4  Hip extension    Hip abduction 4 4  Hip adduction 4 4  Hip internal rotation    Hip external rotation    Knee flexion 3+ 4  Knee extension 4 4  Ankle dorsiflexion 3- 3+  Ankle plantarflexion    Ankle inversion    Ankle eversion    (Blank rows = not tested)  TRANSFERS: Assistive device utilized:  able to stand without UE support, but uses backs of legs at chair arms forward for momentum and None  Sit to stand: SBA Stand to sit: SBA  GAIT: Gait pattern: step through pattern, decreased step length- Right, decreased step length- Left, decreased stride length, decreased ankle dorsiflexion- Right, wide BOS, and poor foot clearance- Right Distance walked: 40 ft x 2 Assistive device utilized: Single point cane Level of assistance: SBA Comments: Decreased hip stability noted with gait-Trendelenburg pattern.  Brief trial of rollator-pt able to take longer strides  FUNCTIONAL TESTs:  5 times sit to  stand: 34.78 sec with hands at knees Timed up and go (TUG): 32.94 sec Single limb stance:  < 1 sec each leg Tandem stance;  RLE posterior 11.4  sec, LLE posterior 13.5 sec  TODAY'S TREATMENT:  Initiated HEP-see below   PATIENT EDUCATION: Education details: PT POC, eval results, initial HEP Person educated: Patient Education method: Explanation, Demonstration, and Handouts Education comprehension: verbalized understanding, returned demonstration, and needs further education   HOME EXERCISE PROGRAM: Access Code: [email or web address] URL: [email or web address] Date: 05/09/2022 Prepared by: [facility]  Program Notes Using your rollator walker, walk about 2-3 minutes in your hallway at home, focusing on taking long strides and standing up tall.  Exercises - Staggered Stance Forward Backward Weight Shift with Counter Support  - 1 x daily - 5 x weekly - 1-2 sets - 10 reps - Standing Hip Abduction with Counter Support  - 1 x daily - 5 x weekly - 1-2 sets - 10 reps - Standing March with Counter Support  - 1 x daily - 5 x weekly - 1-2 sets - 10 reps    GOALS: Goals reviewed with patient? Yes  SHORT TERM GOALS: Target date: 06/07/2022  Pt will be independent with HEP for improved strength, balance, gait. Baseline: Goal status: IN PROGRESS  2.  Pt will improve 5x sit<>stand to less than or equal to 25 sec to demonstrate improved functional strength and transfer efficiency.  Baseline: 34.78 sec Goal status: IN PROGRESS  3.  Pt will improve TUG score to less than or equal to 25 sec for decreased fall risk. Baseline: 32.94 sec Goal status: IN PROGRESS   LONG TERM GOALS: Target date: 06/21/2022  Pt will be independent with progression of HEP for improved strength, balance, gait.  Baseline:  Goal status: IN PROGRESS  2.  Pt will improve 5x sit<>stand to less than or equal to 20 sec to demonstrate improved functional strength and transfer efficiency. Baseline: 34.78 sec Goal status:IN PROGRESS  3.  Pt will improve TUG score to less than or equal to 20 sec for decreased fall  risk. Baseline: 32.94 Goal status: IN PROGRESS  4.  Patient to demonstrate gait speed of 1.8 ft/sec in order to improve safe access to community.   Baseline: 1.3 ft/sec) Goal status: IN PROGRESS  5.  2MWT to be assessed and goal to be written as appropriate.  Improve 2WMT distance by at least 40 ft for improved gait efficiency and safety. Baseline: 160 ft 05/13/2022 Goal status: IN PROGRESS  ASSESSMENT:  CLINICAL IMPRESSION: Pt presents to OPPT today with no significant concerns.  She does feel her exercises are helping and she is able to voice using strategies such as trunk flexibility exercises in the bed to help manage back pain.  She does report going to see neurosurgeon in the next few weeks.  Performed sitting and standing exercises for hip strength.  She does continue to improve walking pattern and foot clearance/step length with rollator, but with fatigue, note reverting back to smaller step pattern.  She will continue to benefit from skilled PT towards goals for improved functional mobility and decreased fall risk.  OBJECTIVE IMPAIRMENTS Abnormal gait, decreased balance, decreased mobility, difficulty walking, decreased ROM, decreased strength, and impaired flexibility.   ACTIVITY LIMITATIONS standing, transfers, and locomotion level  PARTICIPATION LIMITATIONS: shopping, community activity, and church  PERSONAL FACTORS 3+ comorbidities: see above-note extensive lumbar/cervical involvement (has already had PT this year for lumbar PT)  are also affecting  patient's functional outcome.   REHAB POTENTIAL: Good  CLINICAL DECISION MAKING: Evolving/moderate complexity  EVALUATION COMPLEXITY: Moderate  PLAN: PT FREQUENCY: 2x/week  PT DURATION: 6 weeks plus eval visit  PLANNED INTERVENTIONS: Therapeutic exercises, Therapeutic activity, Neuromuscular re-education, Balance training, Gait training, Patient/Family education, Self Care, Orthotic/Fit training, DME instructions, and  Manual therapy  PLAN FOR NEXT SESSION: Pt may be interested in taking a break from therapy after this week until she sees her MD/neurosurgeon.  Check STGs next visit and make any additional HEP updates.  Continue to reinforce rollator use for more stable gait.      Mady Haagensen, PT 06/03/22 11:56 AM Phone: [phone] Fax: [phone]   [facility] at [facility] [place], [place], [place] [address] Phone # [phone] Fax # [phone]

## 2022-06-03 NOTE — Therapy (Incomplete)
OUTPATIENT PHYSICAL THERAPY NEURO TREATMENT NOTE   Patient Name: Lisa [name] MRN: [id number] DOB:Apr 09, 1936, 86 y.o., female Today's Date: 05/27/2022   PCP: Lawerance Cruel, MD REFERRING PROVIDER: Marcial Pacas, MD   PT End of Session - 05/27/22 1058     Visit Number 6    Number of Visits 13    Date for PT Re-Evaluation 06/21/22    Authorization Type Medicare/AARP    PT Start Time 1012    PT Stop Time 1057    PT Time Calculation (min) 45 min    Equipment Utilized During Treatment Gait belt    Activity Tolerance Patient tolerated treatment well    Behavior During Therapy WFL for tasks assessed/performed                 Past Medical History:  Diagnosis Date   Arthritis    DDD (degenerative disc disease), cervical    Deafness in right ear    Diverticulosis    Hearing loss in left ear    Heart murmur    Kidney stones    Psoriasis    Past Surgical History:  Procedure Laterality Date   APPENDECTOMY     BILATERAL SALPINGOOPHORECTOMY  07/22/1994   CATARACT EXTRACTION     CHOLECYSTECTOMY  07/22/2000   COLONOSCOPY  2003/2009/2015   gamma knife     TOTAL ABDOMINAL HYSTERECTOMY  07/22/1977   FIBROIDS    Patient Active Problem List   Diagnosis Date Noted   Cervical spondylosis 05/14/2022   Gait abnormality 04/30/2022   Advanced nonexudative age-related macular degeneration of both eyes without subfoveal involvement 04/04/2022   Peripheral neuropathy 12/18/2021   Anxiety 09/27/2020   Aortic valve disorder 09/27/2020   Arthritis 09/27/2020   Hyperthyroidism 09/27/2020   Thyromegaly 09/27/2020   Body mass index (BMI) 36.0-36.9, adult 09/27/2020   History of radiation exposure 09/27/2020   Idiopathic kyphoscoliosis 09/27/2020   Macular degeneration 09/27/2020   Other seasonal allergic rhinitis 09/27/2020   Unsteady gait 09/27/2020   Arthritis, multiple joint involvement 08/21/2020   Atrophy of vagina 08/21/2020   Gastro-esophageal reflux disease without  esophagitis 08/21/2020   Generalized anxiety disorder 08/21/2020   Mixed hyperlipidemia 08/21/2020   Osteoporosis 08/21/2020   Perennial allergic rhinitis with seasonal variation 08/21/2020   Psoriatic arthritis ([place]) 08/21/2020   Vitamin D deficiency 08/21/2020   Body mass index (BMI) 35.0-35.9, adult 05/08/2020   Lumbago with sciatica, right side 05/08/2020   Anterolisthesis of lumbar spine 05/08/2020   Other chronic pain 05/08/2020   Intermediate stage nonexudative age-related macular degeneration of both eyes 04/03/2020   Posterior vitreous detachment of right eye 04/03/2020   Posterior vitreous detachment of left eye 04/03/2020   Chronic secretory otitis media of right ear 09/23/2019   Sensorineural hearing loss (SNHL) of left ear with restricted hearing of right ear 06/09/2019   Multinodular goiter 11/07/2016   Neck pain 01/16/2015   Aortic stenosis 01/06/2014   Essential hypertension, benign 01/06/2014   Family history of coronary artery disease 01/06/2014   Diabetes ([place]) 01/06/2014   Chronic low back pain 07/05/2013   Displacement of lumbar intervertebral disc without myelopathy 07/05/2013   Lumbosacral radiculitis 07/05/2013   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 Noticed the same tightness in the R leg as before.  Pt accompanied by: self  PERTINENT HISTORY: See above; bulging disk in lumbar spine  PAIN:   Are you having pain? Yes: NPRS scale: 3-4/10 Pain location: from neck to LB Pain description: soreness, sometimes sharp pain Aggravating factors: sitting or standing too long Relieving factors: exercises help  PRECAUTIONS: Fall  WEIGHT BEARING RESTRICTIONS No  FALLS: Has patient fallen in last 6 months? No Reports she has come close-R foot catches  LIVING ENVIRONMENT: Lives with: lives alone Lives in: House/apartment Stairs: Yes: Internal: 8, 5 steps; on right going up Has following equipment at home: Single point cane, Walker - 2 wheeled, Environmental consultant - 4 wheeled, and walkers were given to her; she doesn't typically use as they are too heavy to transport  PLOF: Independent, Independent with household mobility without device, and Independent with community mobility with device  PATIENT GOALS Pt's goals for therapy is to improve strength and avoid limiting my activities due to weakness  OBJECTIVE:    TODAY'S TREATMENT: 06/03/2022 Activity Comments                       TODAY'S TREATMENT: 05/27/22 Activity Comments  Nustep L4 x 6 min Ues/LEs Cues to maintain at least 55 SPM  Supine HS stretch with strap 2x30" PT assist; c/o sensation of LB pressure   R mod thomas stretch 2x30" with strap 2x30  Edu on expected sensation and reasoning behind this stretch  R quad set 10x5"  Good tolerance  R SLR 2x5 Limited ROM and eccentric control; no quad lag  Supine LTR x20 To tolerance   gait with rollator 200 ft Limited foot clearance d/t hip flexor wekaness; edu and practice using brakes for transfers   stair navigation with B/single handrail with cane L-LE dominant pattern; edu on proper use of cane with CGA            PATIENT EDUCATION: Education details: encouraged use of 4WW outside and instructed to keep it in her car for use out in the community rather than having to carry walker up/down stairs  Person educated: Patient Education method: Explanation, Demonstration, Tactile  cues, and Verbal cues Education comprehension: verbalized understanding and returned demonstration     HOME EXERCISE PROGRAM Last updated: 05/17/22 Access Code: [email or web address] URL:  [email or web address] Date: 05/17/2022 Prepared by: [facility]  Program Notes Using your rollator walker, walk about 2-3 minutes in your hallway at home, focusing on taking long strides and standing up tall.  Exercises - Staggered Stance Forward Backward Weight Shift with Counter Support  - 1 x daily - 5 x weekly - 1-2 sets - 10 reps - Standing Hip Abduction with Resistance at Ankles and Counter Support  - 1 x daily - 5 x weekly - 2 sets - 10 reps - Marching with Resistance  - 1 x daily - 5 x weekly - 2 sets - 10 reps - Seated Ankle Dorsiflexion with Resistance  - 1 x daily - 5 x weekly - 2 sets - 10 reps    ------------------------------------------------------------------- (Objective measures below from evaluation findings):  DIAGNOSTIC FINDINGS: MRI cervical spine (05/07/2022) - At C4-5 disc bulging, facet and ligamentum flavum hypertrophy with moderate spinal stenosis and severe bilateral foraminal stenosis; no cord signal changes. - At C5-6 disc bulging and facet hypertrophy with moderate spinal stenosis and severe bilateral foraminal stenosis; no cord signal changes. - At C6-7 disc bulging and facet hypertrophy with mild spinal stenosis and severe bilateral foraminal stenosis. - At C3-4 disc bulging with severe right foraminal stenosis. lumbar spine  Lumbar spine MRI 02/18/2022: 1. Mild central canal stenosis L1-2 with mild progression since September 2022 MRI 2. Mild central canal stenosis L2-3. Mild progression of right subarticular stenosis L2-3 3. Mild central canal stenosis L3-4 with mild progression. Moderate right subarticular stenosis with mild progression 4. Moderate central canal stenosis L4-5 with moderate subarticular stenosis on the right,  unchanged. 5. Mild anterolisthesis L5-S1 due to severe facet degeneration. No significant neural impingement.  COGNITION: Overall cognitive status: Within functional limits for tasks assessed   SENSATION: Light touch: WFL  EDEMA:  Noted BLEs-pt states this is not new  POSTURE: rounded shoulders and forward head  LOWER EXTREMITY ROM:    R hip flexion slightly decreased against gravity in sitting; -10 degrees R ankle dflex, +8 degrees L ankle dflex  Active  Right Eval Left Eval  Hip flexion    Hip extension    Hip abduction    Hip adduction    Hip internal rotation    Hip external rotation    Knee flexion    Knee extension    Ankle dorsiflexion    Ankle plantarflexion    Ankle inversion    Ankle eversion     (Blank rows = not tested)  LOWER EXTREMITY MMT:    MMT Right Eval Left Eval  Hip flexion 3+ 4  Hip extension    Hip abduction 4 4  Hip adduction 4 4  Hip internal rotation    Hip external rotation    Knee flexion 3+ 4  Knee extension 4 4  Ankle dorsiflexion 3- 3+  Ankle plantarflexion    Ankle inversion    Ankle eversion    (Blank rows = not tested)  TRANSFERS: Assistive device utilized:  able to stand without UE support, but uses backs of legs at chair arms forward for momentum and None  Sit to stand: SBA Stand to sit: SBA  GAIT: Gait pattern: step through pattern, decreased step length- Right, decreased step length- Left, decreased stride length, decreased ankle dorsiflexion- Right, wide BOS, and poor foot clearance- Right Distance walked: 40 ft x 2 Assistive device utilized: Single point cane Level of assistance: SBA Comments: Decreased hip stability noted with gait-Trendelenburg pattern.  Brief trial of rollator-pt able to take longer strides  FUNCTIONAL TESTs:  5 times sit to stand: 34.78 sec with hands at knees Timed up and go (TUG): 32.94 sec Single limb stance:  < 1 sec each leg Tandem stance;  RLE posterior 11.4 sec, LLE posterior 13.5  sec  TODAY'S TREATMENT:  Initiated HEP-see below   PATIENT EDUCATION: Education details: PT POC, eval results, initial HEP Person educated: Patient Education method: Explanation, Demonstration, and Handouts Education comprehension: verbalized understanding, returned demonstration, and needs further education   HOME EXERCISE PROGRAM: Access Code: [email or web address] URL: [email or web address] Date: 05/09/2022 Prepared by: [facility]  Program Notes Using your rollator walker, walk about 2-3 minutes in your hallway at home, focusing on taking long strides and standing up tall.  Exercises - Staggered Stance Forward Backward Weight Shift with Counter Support  - 1 x daily - 5 x weekly - 1-2 sets - 10 reps - Standing Hip Abduction with Counter Support  - 1 x daily - 5 x weekly - 1-2 sets - 10 reps - Standing March with Counter Support  - 1 x daily - 5 x weekly - 1-2 sets - 10 reps    GOALS: Goals reviewed with patient? Yes  SHORT TERM GOALS: Target date: 06/07/2022  Pt will be independent with HEP for improved strength, balance, gait. Baseline: Goal status: IN PROGRESS  2.  Pt will improve 5x sit<>stand to less than or equal to 25 sec to demonstrate improved functional strength and transfer efficiency.  Baseline: 34.78 sec Goal status: IN PROGRESS  3.  Pt will improve TUG score to less than or equal to 25 sec for decreased fall risk. Baseline: 32.94 sec Goal status: IN PROGRESS   LONG TERM GOALS: Target date: 06/21/2022  Pt will be independent with progression of HEP for improved strength, balance, gait.  Baseline:  Goal status: IN PROGRESS  2.  Pt will improve 5x sit<>stand to less than or equal to 20 sec to demonstrate improved functional strength and transfer efficiency. Baseline: 34.78 sec Goal status:IN PROGRESS  3.  Pt will improve TUG score to less than or equal to 20 sec for decreased fall risk. Baseline: 32.94 Goal  status: IN PROGRESS  4.  Patient to demonstrate gait speed of 1.8 ft/sec in order to improve safe access to community.   Baseline: 1.3 ft/sec) Goal status: IN PROGRESS  5.  2MWT to be assessed and goal to be written as appropriate.  Improve 2WMT distance by at least 40 ft for improved gait efficiency and safety. Baseline: 160 ft 05/13/2022 Goal status: IN PROGRESS  ASSESSMENT:  CLINICAL IMPRESSION: ***Patient arrived to session with report of continued sensation of tightness in the R LE. Addressed this complaint with gentle LE stretching, which patient reported relief with. Proceeded with mat quad strengthening to address weakness in the R LE. Patient responded well to cues for form, but required limited reps d/t sensation of LB "pressure." Worked on gait training with [phone] as this provides improved stability and discussed safe way for patient to be able to use this device out in the community rather than cane. Patient agreeable and without complaints at end o session.   OBJECTIVE IMPAIRMENTS Abnormal gait, decreased balance, decreased mobility, difficulty walking, decreased ROM, decreased strength, and impaired flexibility.   ACTIVITY LIMITATIONS standing, transfers, and locomotion level  PARTICIPATION LIMITATIONS: shopping, community activity, and church  PERSONAL FACTORS 3+ comorbidities: see above-note extensive lumbar/cervical involvement (has already had  PT this year for lumbar PT)  are also affecting patient's functional outcome.   REHAB POTENTIAL: Good  CLINICAL DECISION MAKING: Evolving/moderate complexity  EVALUATION COMPLEXITY: Moderate  PLAN: PT FREQUENCY: 2x/week  PT DURATION: 6 weeks plus eval visit  PLANNED INTERVENTIONS: Therapeutic exercises, Therapeutic activity, Neuromuscular re-education, Balance training, Gait training, Patient/Family education, Self Care, Orthotic/Fit training, DME instructions, and Manual therapy  PLAN FOR NEXT SESSION: ***Work on ankle and  hip strength.  Continue to practice rollator for more stable gait.      Mady Haagensen, PT 06/03/22 7:48 AM Phone: [phone] Fax: [phone]   [facility] at [facility] [place], [place], [place] [address] Phone # [phone] Fax # [phone]

## 2022-06-05 ENCOUNTER — Ambulatory Visit: Payer: Medicare Other | Admitting: Physical Therapy

## 2022-06-12 DIAGNOSIS — M47816 Spondylosis without myelopathy or radiculopathy, lumbar region: Secondary | ICD-10-CM | POA: Diagnosis not present

## 2022-06-18 DIAGNOSIS — L4059 Other psoriatic arthropathy: Secondary | ICD-10-CM | POA: Diagnosis not present

## 2022-06-20 DIAGNOSIS — R351 Nocturia: Secondary | ICD-10-CM | POA: Diagnosis not present

## 2022-06-20 DIAGNOSIS — R350 Frequency of micturition: Secondary | ICD-10-CM | POA: Diagnosis not present

## 2022-06-26 DIAGNOSIS — Z6835 Body mass index (BMI) 35.0-35.9, adult: Secondary | ICD-10-CM | POA: Diagnosis not present

## 2022-06-26 DIAGNOSIS — M5416 Radiculopathy, lumbar region: Secondary | ICD-10-CM | POA: Diagnosis not present

## 2022-06-26 DIAGNOSIS — M4802 Spinal stenosis, cervical region: Secondary | ICD-10-CM | POA: Diagnosis not present

## 2022-07-02 ENCOUNTER — Telehealth: Payer: Self-pay

## 2022-07-02 NOTE — Telephone Encounter (Signed)
..     Pre-operative Risk Assessment    Patient Name: Lisa [name]  DOB: Dec 07, 1935 MRN: [id number]      Request for Surgical Clearance    Procedure:   L3-4, L4-5 LUMBAR LAMINECTOMY  Date of Surgery:  Clearance TBD                                 Surgeon:  DR Earnie Larsson Surgeon's Group or Practice Name:  [facility] Phone number:  [phone] Fax number:  [phone]   Type of Clearance Requested:   - Medical    Type of Anesthesia:  General    Additional requests/questions:    Ramanda, Paules   07/02/2022, 10:32 AM

## 2022-07-03 DIAGNOSIS — M1712 Unilateral primary osteoarthritis, left knee: Secondary | ICD-10-CM | POA: Diagnosis not present

## 2022-07-03 DIAGNOSIS — M1711 Unilateral primary osteoarthritis, right knee: Secondary | ICD-10-CM | POA: Diagnosis not present

## 2022-07-03 DIAGNOSIS — M170 Bilateral primary osteoarthritis of knee: Secondary | ICD-10-CM | POA: Diagnosis not present

## 2022-07-03 NOTE — Telephone Encounter (Signed)
   Name: Lisa [name]  DOB: Sep 22, 1935  MRN: [id number]  Primary Cardiologist: Candee Furbish, MD  Chart reviewed as part of pre-operative protocol coverage. Because of Lisa [name]'s past medical history and time since last visit, she will require a follow-up phone-office visit in order to better assess preoperative cardiovascular risk.  Pre-op covering staff: - Please schedule appointment and call patient to inform them. If patient already had an upcoming appointment within acceptable timeframe, please add "pre-op clearance" to the appointment notes so provider is aware. - Please contact requesting surgeon's office via preferred method (i.e, phone, fax) to inform them of need for appointment prior to surgery.  Loel Dubonnet, NP  07/03/2022, 9:51 AM

## 2022-07-03 NOTE — Telephone Encounter (Signed)
Tried to call the pt to set up tele pre op appt but no answer.  

## 2022-07-05 ENCOUNTER — Other Ambulatory Visit: Payer: Self-pay | Admitting: Internal Medicine

## 2022-07-05 ENCOUNTER — Telehealth: Payer: Self-pay | Admitting: *Deleted

## 2022-07-05 DIAGNOSIS — E0520 Thyrotoxicosis with toxic multinodular goiter without thyrotoxic crisis or storm: Secondary | ICD-10-CM | POA: Diagnosis not present

## 2022-07-05 DIAGNOSIS — Z923 Personal history of irradiation: Secondary | ICD-10-CM | POA: Diagnosis not present

## 2022-07-05 NOTE — Telephone Encounter (Signed)
Done

## 2022-07-05 NOTE — Telephone Encounter (Signed)
I spoke with the pt and she has been scheduled to see Dr. Marlou Porch for pre op clearance 08/01/22 @ 1:30. Kelli Churn, RN has given the ok to use 7 day hold slot for the appt. The pt is grateful for the appt and the help and wanted me to let Dr. Marlou Porch and his nurse Jeannene Patella know she said thank you.

## 2022-07-05 NOTE — Telephone Encounter (Signed)
Patient is returning call to set up pre-op appt. Please call back

## 2022-07-05 NOTE — Telephone Encounter (Signed)
Left message to call back to schedule tele pre op appt.  

## 2022-07-11 ENCOUNTER — Encounter: Payer: Self-pay | Admitting: Physical Therapy

## 2022-07-11 NOTE — Therapy (Signed)
[facility] [address], [address] [place], [place], [address] Phone: [phone]   Fax:  [phone]  Patient Details  Name: Lisa [name] MRN: [id number] Date of Birth: 05/20/1936 Referring Provider:  No ref. provider found  Encounter Date: 07/11/2022  PHYSICAL THERAPY DISCHARGE SUMMARY  Visits from Start of Care: 7  Current functional level related to goals / functional outcomes: See  eval-STGs not fully able to be assessed, as pt did not return to therapy after 7th visit.   Remaining deficits: Strength, balance   Education / Equipment: Educated in HEP, benefits of rollator use   Patient agrees to discharge. Patient goals were not met. Patient is being discharged due to not returning since the last visit.  MARRIOTT,AMY W., PT 07/11/2022, 8:00 AM  [facility] [address], [address] [place], [place], [address] Phone: [phone]   Fax:  [phone] 

## 2022-07-17 DIAGNOSIS — H748X1 Other specified disorders of right middle ear and mastoid: Secondary | ICD-10-CM | POA: Diagnosis not present

## 2022-07-17 DIAGNOSIS — D447 Neoplasm of uncertain behavior of aortic body and other paraganglia: Secondary | ICD-10-CM | POA: Diagnosis not present

## 2022-07-17 DIAGNOSIS — H9190 Unspecified hearing loss, unspecified ear: Secondary | ICD-10-CM | POA: Diagnosis not present

## 2022-07-17 DIAGNOSIS — H6531 Chronic mucoid otitis media, right ear: Secondary | ICD-10-CM | POA: Diagnosis not present

## 2022-07-17 DIAGNOSIS — H90A22 Sensorineural hearing loss, unilateral, left ear, with restricted hearing on the contralateral side: Secondary | ICD-10-CM | POA: Diagnosis not present

## 2022-07-17 DIAGNOSIS — G9389 Other specified disorders of brain: Secondary | ICD-10-CM | POA: Diagnosis not present

## 2022-07-17 DIAGNOSIS — H905 Unspecified sensorineural hearing loss: Secondary | ICD-10-CM | POA: Diagnosis not present

## 2022-07-24 DIAGNOSIS — Z23 Encounter for immunization: Secondary | ICD-10-CM | POA: Diagnosis not present

## 2022-08-01 ENCOUNTER — Encounter: Payer: Self-pay | Admitting: Cardiology

## 2022-08-01 ENCOUNTER — Ambulatory Visit: Payer: Medicare Other | Attending: Cardiology | Admitting: Cardiology

## 2022-08-01 VITALS — BP 130/60 | HR 61 | Ht 60.0 in | Wt 197.8 lb

## 2022-08-01 DIAGNOSIS — I10 Essential (primary) hypertension: Secondary | ICD-10-CM | POA: Diagnosis not present

## 2022-08-01 DIAGNOSIS — I350 Nonrheumatic aortic (valve) stenosis: Secondary | ICD-10-CM | POA: Insufficient documentation

## 2022-08-01 DIAGNOSIS — Z8249 Family history of ischemic heart disease and other diseases of the circulatory system: Secondary | ICD-10-CM

## 2022-08-01 NOTE — Progress Notes (Signed)
Cardiology Office Note:    Date:  08/01/2022   ID:  Lisa [name], DOB 1936-02-04, MRN [id number]  PCP:  Lisa Cruel, MD   [facility] Cardiologist:  Lisa Furbish, MD     Referring MD: Lisa Cruel, MD     History of Present Illness:    Lisa [name] is a 87 y.o. female here for preoperative risk assessment prior to spinal surgery and follow-up of aortic stenosis, hypertension, as well as review of echocardiogram from 12/07/21.  She previously sustained a close fall where she grabbed onto the bed to break her fall.  Pulled several muscles.  Called EMS.  She did not have to go to the hospital.  She is also had a few dental caries treated.  Oral thrush treated.  Last year had radioactive iodine thyroid ablation.  Her brother had CABG 20 years ago.   Today, she is accompanied with her nefew today, Lisa [name], he is a patient of Dr. Percival [name].  she says she is doing well, but did not sleep well last night. She has been experiencing LE pain and weakness. She adds that she also has pain and weakness bilaterally in her thighs as well.  She has been to spinal surgeon who is offering surgery to help her.  She has been through many sessions of physical therapy over the past 4 years.   She remains compliant with the B-complex vitamins and folic acid supplements in addition to her blood pressure medications.   Mrs. Lisa [name] reports that  heart problems are common in her family.    Sinus pain at times. Right ear tumor. Dr. Cresenciano [name]. Tumor is stable on imaging.   Thyroid nodules. Pushing on trachea she states. Denies any chest pain.  She has had more difficulty with walking.  Has chronic lower extremity edema, legs do improve in the morning hours after sleeping, dependent edema.  Past Medical History:  Diagnosis Date   Arthritis    DDD (degenerative disc disease), cervical    Deafness in right ear    Diverticulosis    Hearing loss in left ear    Heart murmur    Kidney stones     Psoriasis     Past Surgical History:  Procedure Laterality Date   APPENDECTOMY     BILATERAL SALPINGOOPHORECTOMY  07/22/1994   CATARACT EXTRACTION     CHOLECYSTECTOMY  07/22/2000   COLONOSCOPY  2003/2009/2015   gamma knife     TOTAL ABDOMINAL HYSTERECTOMY  07/22/1977   FIBROIDS     Current Medications: Current Meds  Medication Sig   amLODipine (NORVASC) 5 MG tablet Take 5 mg by mouth daily.    Calcium Carbonate-Vit D-Min (CALTRATE PLUS PO) Take 1 tablet by mouth 2 (two) times daily.    Cholecalciferol (VITAMIN D) 2000 units tablet Take 2,000 Units by mouth daily.   Cyanocobalamin (B-12) 1000 MCG CAPS Take by mouth daily.   fluticasone (FLONASE) 50 MCG/ACT nasal spray Place 1 spray into both nostrils daily.    folic acid (FOLVITE) 1 MG tablet Take 1 mg by mouth daily.    losartan-hydrochlorothiazide (HYZAAR) 100-25 MG tablet TAKE 1 TABLET BY MOUTH EVERY DAY   meloxicam (MOBIC) 15 MG tablet Take 15 mg by mouth daily.   methotrexate (RHEUMATREX) 2.5 MG tablet Take 10 mg by mouth 2 (two) times a week.    Multiple Vitamins-Minerals (PRESERVISION AREDS) TABS Take 1 tablet by mouth daily.   nystatin (MYCOSTATIN) 100000 UNIT/ML suspension Take by mouth.  omeprazole (PRILOSEC) 20 MG capsule TAKE 1 CAPSULE BY MOUTH EVERY OTHER DAY   Polyethyl Glycol-Propyl Glycol (SYSTANE) 0.4-0.3 % SOLN See admin instructions.   triamcinolone (KENALOG) 0.1 % Apply 1 application topically 2 (two) times daily.     Allergies:   Doxycycline, Lisinopril, and Penicillins   Social History   Socioeconomic History   Marital status: Single    Spouse name: Not on file   Number of children: Not on file   Years of education: Not on file   Highest education level: Not on file  Occupational History   Not on file  Tobacco Use   Smoking status: Former   Smokeless tobacco: Never  Vaping Use   Vaping Use: Never used  Substance and Sexual Activity   Alcohol use: Yes    Comment: WINE   Drug use: No   Sexual  activity: Not on file  Other Topics Concern   Not on file  Social History Narrative   Not on file   Social Determinants of Health   Financial Resource Strain: Not on file  Food Insecurity: Not on file  Transportation Needs: Not on file  Physical Activity: Not on file  Stress: Not on file  Social Connections: Not on file     Family History: The patient's family history includes Goiter in her brother and mother; Heart disease in her brother, brother, father, mother, and paternal grandfather.  ROS:   Please see the history of present illness.   (+) LE Pain and weakness (+) Neuropathy in LE (+) Fatigue  All other systems reviewed and are negative.  EKGs/Labs/Other Studies Reviewed:    The following studies were reviewed today:  ECHO 12/07/2021:  IMPRESSIONS  1. Left ventricular ejection fraction, by estimation, is >75%. The left  ventricle has hyperdynamic function. The left ventricle has no regional  wall motion abnormalities. There is mild left ventricular hypertrophy.  Left ventricular diastolic parameters  are consistent with Grade I diastolic dysfunction (impaired relaxation).   2. Right ventricular systolic function is normal. The right ventricular  size is normal.   3. Left atrial size was mildly dilated.   4. The mitral valve is normal in structure. Trivial mitral valve  regurgitation. No evidence of mitral stenosis.   5. The aortic valve is tricuspid. Aortic valve regurgitation is mild.  Mild to moderate aortic valve stenosis.   6. The inferior vena cava is normal in size with greater than 50%  respiratory variability, suggesting right atrial pressure of 3 mmHg.    ECHO 01/11/2020:  1. Hyperdynamic LV systolic function; mild LVH; grade 1 diastolic  dysfunction; calcified aortic valve with mild AS (mean gradient 14 mmHg)  and mild AI; mild MR; mild LAE.   2. Left ventricular ejection fraction, by estimation, is >75%. The left  ventricle has hyperdynamic  function. The left ventricle has no regional  wall motion abnormalities. There is mild left ventricular hypertrophy.  Left ventricular diastolic parameters  are consistent with Grade I diastolic dysfunction (impaired relaxation).   3. Right ventricular systolic function is normal. The right ventricular  size is normal.   4. Left atrial size was mildly dilated.   5. The mitral valve is normal in structure. No evidence of mitral valve  regurgitation. No evidence of mitral stenosis.   6. The aortic valve is tricuspid. Aortic valve regurgitation is mild.  Mild aortic valve stenosis.   7. The inferior vena cava is normal in size with greater than 50%  respiratory variability, suggesting  right atrial pressure of 3 mmHg.    ECHO 01/01/2018: Study Conclusions   - Left ventricle: The cavity size was normal. There was mild    concentric hypertrophy. Systolic function was vigorous. The    estimated ejection fraction was in the range of 65% to 70%. Wall    motion was normal; there were no regional wall motion    abnormalities. Doppler parameters are consistent with abnormal    left ventricular relaxation (grade 1 diastolic dysfunction).    There was no evidence of elevated ventricular filling pressure by    Doppler parameters.  - Aortic valve: Trileaflet; moderately thickened, moderately    calcified leaflets. There was mild stenosis. Mean gradient (S):    12 mm Hg. Peak gradient (S): 25 mm Hg. Valve area (VTI): 2.48    cm^2. Valve area (Vmax): 2.17 cm^2. Valve area (Vmean): 2.31    cm^2.  - Mitral valve: Calcified annulus. Mildly thickened leaflets .  - Left atrium: The atrium was mildly dilated.  - Right ventricle: Systolic function was normal.  - Right atrium: The atrium was normal in size.  - Pulmonic valve: There was no regurgitation.  - Pulmonary arteries: Systolic pressure was within the normal    range.  - Inferior vena cava: The vessel was normal in size.  - Pericardium,  extracardiac: There was no pericardial effusion.     EKG: EKG is personally reviewed.   08/01/2022-normal sinus rhythm 61 poor R wave progression.  12/18/2021 EKG: Rate 61. Sinus Rhythm, inferior infarction pattern.  12/11/2020 EKG: Sinus rhythm 72 with sinus arrhythmia. Sinus rhythm 62 on 12/16/2019.  Recent Labs: No results found for requested labs within last 365 days.  Recent Lipid Panel No results found for: "CHOL", "TRIG", "HDL", "CHOLHDL", "VLDL", "LDLCALC", "LDLDIRECT"   Risk Assessment/Calculations:      Physical Exam:    VS:  BP 130/60   Pulse 61   Ht 5' (1.524 m)   Wt 197 lb 12.8 oz (89.7 kg)   LMP  (LMP Unknown)   SpO2 98%   BMI 38.63 kg/m     Wt Readings from Last 3 Encounters:  08/01/22 197 lb 12.8 oz (89.7 kg)  04/30/22 197 lb (89.4 kg)  12/18/21 194 lb (88 kg)     GEN:  Well nourished, well developed in no acute distress HEENT: Normal NECK: No JVD; radiation carotid bruits LYMPHATICS: No lymphadenopathy CARDIAC: RRR, 3/6 SM RUSB, no rubs, gallops RESPIRATORY:  Clear to auscultation without rales, wheezing or rhonchi  ABDOMEN: Soft, non-tender, non-distended MUSCULOSKELETAL:  Chronic edema; No deformity  SKIN: Warm and dry NEUROLOGIC:  Alert and oriented x 3 PSYCHIATRIC:  Normal affect   ASSESSMENT:    1. Nonrheumatic aortic valve stenosis   2. Essential hypertension, benign   3. Family history of coronary artery disease      PLAN:    In order of problems listed above:  Pre-op evaluation We will go ahead and check an echocardiogram to ensure that there is no significant progression in her aortic stenosis.  If this is reasonable, she may proceed with her surgery with low to moderate overall risk based mainly upon her age.  Aortic stenosis Echocardiogram 2023 shows mild to moderate aortic valve stenosis.  Not severe.  No indication for surgical or TAVR intervention at this time.  We will continue to monitor.  We will check echocardiogram  soon.  Murmur on exam.  Essential hypertension, benign Continue with current medication management, amlodipine 5 mg a day,  losartan hydrochlorothiazide 100/25 mg a day.  Judicious use of NSAIDs.  Blood pressure little bit elevated today.  She used to have a wrist cuff, this broke.  I have suggested an arm cuff for her.  No changes.  Family history of coronary artery disease  Her father, brother, 3 nephews all of had CAD.  Continue with aggressive secondary risk factor prevention.  She is on aspirin 81 mg every other day.  No changes.  Psoriatic arthritis ([place]) Has taken methotrexate.  Aspirin every other day.  Had thrush treated.  Peripheral neuropathy Overall has some leg weakness, leg pain.  Continue to work with physical therapy type exercises.  Distal pulses were excellent.     Medication Adjustments/Labs and Tests Ordered: Current medicines are reviewed at length with the patient today.  Concerns regarding medicines are outlined above.  Orders Placed This Encounter  Procedures   EKG 12-Lead   ECHOCARDIOGRAM COMPLETE   No orders of the defined types were placed in this encounter.   Patient Instructions  Medication Instructions:  The current medical regimen is effective;  continue present plan and medications.  *If you need a refill on your cardiac medications before your next appointment, please call your pharmacy*  Testing/Procedures: Your physician has requested that you have an echocardiogram. Echocardiography is a painless test that uses sound waves to create images of your heart. It provides your doctor with information about the size and shape of your heart and how well your heart's chambers and valves are working. This procedure takes approximately one hour. There are no restrictions for this procedure. Please do NOT wear cologne, perfume, aftershave, or lotions (deodorant is allowed). Please arrive 15 minutes prior to your appointment time.   Follow-Up: At [facility], you and your health needs are our priority.  As part of our continuing mission to provide you with exceptional heart care, we have created designated Provider Care Teams.  These Care Teams include your primary Cardiologist (physician) and Advanced Practice Providers (APPs -  Physician Assistants and Nurse Practitioners) who all work together to provide you with the care you need, when you need it.  We recommend signing up for the patient portal called "MyChart".  Sign up information is provided on this After Visit Summary.  MyChart is used to connect with patients for Virtual Visits (Telemedicine).  Patients are able to view lab/test results, encounter notes, upcoming appointments, etc.  Non-urgent messages can be sent to your provider as well.   To learn more about what you can do with MyChart, go to [email or web address].    Your next appointment:   As previously scheduled.     F/U in 1 year

## 2022-08-01 NOTE — Patient Instructions (Signed)
Medication Instructions:  The current medical regimen is effective;  continue present plan and medications.  *If you need a refill on your cardiac medications before your next appointment, please call your pharmacy*  Testing/Procedures: Your physician has requested that you have an echocardiogram. Echocardiography is a painless test that uses sound waves to create images of your heart. It provides your doctor with information about the size and shape of your heart and how well your heart's chambers and valves are working. This procedure takes approximately one hour. There are no restrictions for this procedure. Please do NOT wear cologne, perfume, aftershave, or lotions (deodorant is allowed). Please arrive 15 minutes prior to your appointment time.   Follow-Up: At [facility], you and your health needs are our priority.  As part of our continuing mission to provide you with exceptional heart care, we have created designated Provider Care Teams.  These Care Teams include your primary Cardiologist (physician) and Advanced Practice Providers (APPs -  Physician Assistants and Nurse Practitioners) who all work together to provide you with the care you need, when you need it.  We recommend signing up for the patient portal called "MyChart".  Sign up information is provided on this After Visit Summary.  MyChart is used to connect with patients for Virtual Visits (Telemedicine).  Patients are able to view lab/test results, encounter notes, upcoming appointments, etc.  Non-urgent messages can be sent to your provider as well.   To learn more about what you can do with MyChart, go to [email or web address].    Your next appointment:   As previously scheduled.

## 2022-08-22 ENCOUNTER — Other Ambulatory Visit: Payer: Medicare Other

## 2022-08-22 ENCOUNTER — Ambulatory Visit (HOSPITAL_COMMUNITY): Payer: Medicare Other | Attending: Cardiology

## 2022-08-22 DIAGNOSIS — I350 Nonrheumatic aortic (valve) stenosis: Secondary | ICD-10-CM | POA: Diagnosis not present

## 2022-08-22 DIAGNOSIS — I10 Essential (primary) hypertension: Secondary | ICD-10-CM | POA: Diagnosis not present

## 2022-08-22 LAB — ECHOCARDIOGRAM COMPLETE
AR max vel: 1.45 cm2
AV Area VTI: 1.55 cm2
AV Area mean vel: 1.36 cm2
AV Mean grad: 15 mmHg
AV Peak grad: 29.2 mmHg
Ao pk vel: 2.7 m/s
Area-P 1/2: 3.68 cm2
P 1/2 time: 594 msec
S' Lateral: 1.8 cm

## 2022-08-23 ENCOUNTER — Other Ambulatory Visit (HOSPITAL_COMMUNITY): Payer: Medicare Other

## 2022-08-23 ENCOUNTER — Ambulatory Visit
Admission: RE | Admit: 2022-08-23 | Discharge: 2022-08-23 | Disposition: A | Discharge status: Home / Self Care | Payer: Medicare Other | Source: Ambulatory Visit | Attending: Internal Medicine | Admitting: Internal Medicine

## 2022-08-23 DIAGNOSIS — E041 Nontoxic single thyroid nodule: Secondary | ICD-10-CM | POA: Diagnosis not present

## 2022-08-23 DIAGNOSIS — E0520 Thyrotoxicosis with toxic multinodular goiter without thyrotoxic crisis or storm: Secondary | ICD-10-CM

## 2022-08-26 DIAGNOSIS — H43811 Vitreous degeneration, right eye: Secondary | ICD-10-CM | POA: Diagnosis not present

## 2022-08-26 DIAGNOSIS — H43812 Vitreous degeneration, left eye: Secondary | ICD-10-CM | POA: Diagnosis not present

## 2022-08-26 DIAGNOSIS — H353133 Nonexudative age-related macular degeneration, bilateral, advanced atrophic without subfoveal involvement: Secondary | ICD-10-CM | POA: Diagnosis not present

## 2022-08-27 ENCOUNTER — Other Ambulatory Visit (HOSPITAL_COMMUNITY): Payer: Medicare Other

## 2022-09-02 ENCOUNTER — Telehealth: Payer: Self-pay | Admitting: *Deleted

## 2022-09-02 NOTE — Telephone Encounter (Signed)
   Pre-operative Risk Assessment    Patient Name: Lisa [name]  DOB: 1935/12/16 MRN: [id number]      Request for Surgical Clearance    Procedure:   L3-4, L4-5 LUMBAR LAMINECTOMY   Date of Surgery:  Clearance TBD                                 Surgeon:  DR. Earnie Larsson  Surgeon's Group or Practice Name:  [facility] Phone number:  [phone] Fax number:  [phone] ATTN: Lorriane Shire EXT [id number]   Type of Clearance Requested:   - Medical ; ASA    Type of Anesthesia:  General    Additional requests/questions:    Jiles Prows   09/02/2022, 1:43 PM

## 2022-09-02 NOTE — Telephone Encounter (Signed)
   Patient Name: ARRIEL VICTOR  DOB: 1936-07-04 MRN: [id number]  Primary Cardiologist: Candee Furbish, MD  Chart reviewed as part of pre-operative protocol coverage. Given past medical history and time since last visit, based on ACC/AHA guidelines, SANDER SPECKMAN is at acceptable risk for the planned procedure without further cardiovascular testing.  Patient had 2D echo completed per Dr. Marlou Porch that showed no progression of aortic stenosis.She may proceed with her surgery with low to moderate overall risk based mainly upon her age.   Regarding ASA therapy may be stopped 5-7 days prior to surgery with a plan to resume it as soon as felt to be feasible from a surgical standpoint in the post-operative period.   I will route this recommendation to the requesting party via Epic fax function and remove from pre-op pool.  Please call with questions.  Mable Fill, Marissa Nestle, NP 09/02/2022, 1:51 PM

## 2022-09-05 DIAGNOSIS — N898 Other specified noninflammatory disorders of vagina: Secondary | ICD-10-CM | POA: Diagnosis not present

## 2022-09-05 DIAGNOSIS — R350 Frequency of micturition: Secondary | ICD-10-CM | POA: Diagnosis not present

## 2022-09-05 DIAGNOSIS — E1169 Type 2 diabetes mellitus with other specified complication: Secondary | ICD-10-CM | POA: Diagnosis not present

## 2022-09-05 DIAGNOSIS — Z6837 Body mass index (BMI) 37.0-37.9, adult: Secondary | ICD-10-CM | POA: Diagnosis not present

## 2022-09-05 DIAGNOSIS — Z23 Encounter for immunization: Secondary | ICD-10-CM | POA: Diagnosis not present

## 2022-09-09 ENCOUNTER — Ambulatory Visit (INDEPENDENT_AMBULATORY_CARE_PROVIDER_SITE_OTHER): Payer: Medicare Other | Admitting: Podiatry

## 2022-09-09 ENCOUNTER — Encounter: Payer: Self-pay | Admitting: Podiatry

## 2022-09-09 DIAGNOSIS — E119 Type 2 diabetes mellitus without complications: Secondary | ICD-10-CM | POA: Diagnosis not present

## 2022-09-09 DIAGNOSIS — M79674 Pain in right toe(s): Secondary | ICD-10-CM

## 2022-09-09 DIAGNOSIS — M79675 Pain in left toe(s): Secondary | ICD-10-CM | POA: Diagnosis not present

## 2022-09-09 DIAGNOSIS — B351 Tinea unguium: Secondary | ICD-10-CM

## 2022-09-09 NOTE — Progress Notes (Signed)
This patient returns to my office for at risk foot care.  This patient requires this care by a professional since this patient will be at risk due to having diabetes.  This patient is unable to cut nails himself since the patient cannot reach his nails.These nails are painful walking and wearing shoes.  .  This patient presents for at risk foot care today.  General Appearance  Alert, conversant and in no acute stress.  Vascular  Dorsalis pedis and posterior tibial  pulses are palpable  bilaterally.  Capillary return is within normal limits  bilaterally. Temperature is within normal limits  bilaterally.  Neurologic  Senn-Weinstein monofilament wire test within normal limits  bilaterally. Muscle power within normal limits bilaterally.  Nails Thick disfigured discolored nails with subungual debris  Hallux nails  B/L.[name] Kitchen No evidence of bacterial infection or drainage bilaterally.  Orthopedic  No limitations of motion  feet .  No crepitus or effusions noted.  No bony pathology or digital deformities noted. ADV 4th toe right foot.  Skin  normotropic skin with no porokeratosis noted bilaterally.  No signs of infections or ulcers noted.     Onychomycosis  Pain in right toes  Pain in left toes  Consent was obtained for treatment procedures.   Mechanical debridement of nails 1-5  bilaterally performed with a nail nipper.  Filed with dremel without incident.    Return office visit  3 months                  Told patient to return for periodic foot care and evaluation due to potential at risk complications.   Gardiner Barefoot DPM

## 2022-09-16 ENCOUNTER — Other Ambulatory Visit: Payer: Self-pay | Admitting: Neurosurgery

## 2022-09-16 ENCOUNTER — Ambulatory Visit: Payer: Medicare Other | Admitting: Podiatry

## 2022-09-17 ENCOUNTER — Ambulatory Visit: Payer: Medicare Other | Admitting: Adult Health

## 2022-09-17 DIAGNOSIS — M1991 Primary osteoarthritis, unspecified site: Secondary | ICD-10-CM | POA: Diagnosis not present

## 2022-09-17 DIAGNOSIS — R7989 Other specified abnormal findings of blood chemistry: Secondary | ICD-10-CM | POA: Diagnosis not present

## 2022-09-17 DIAGNOSIS — Z6837 Body mass index (BMI) 37.0-37.9, adult: Secondary | ICD-10-CM | POA: Diagnosis not present

## 2022-09-17 DIAGNOSIS — M5136 Other intervertebral disc degeneration, lumbar region: Secondary | ICD-10-CM | POA: Diagnosis not present

## 2022-09-17 DIAGNOSIS — L4059 Other psoriatic arthropathy: Secondary | ICD-10-CM | POA: Diagnosis not present

## 2022-09-17 DIAGNOSIS — E041 Nontoxic single thyroid nodule: Secondary | ICD-10-CM | POA: Diagnosis not present

## 2022-09-17 DIAGNOSIS — E669 Obesity, unspecified: Secondary | ICD-10-CM | POA: Diagnosis not present

## 2022-09-17 DIAGNOSIS — R5382 Chronic fatigue, unspecified: Secondary | ICD-10-CM | POA: Diagnosis not present

## 2022-09-17 DIAGNOSIS — M810 Age-related osteoporosis without current pathological fracture: Secondary | ICD-10-CM | POA: Diagnosis not present

## 2022-10-01 DIAGNOSIS — M5416 Radiculopathy, lumbar region: Secondary | ICD-10-CM | POA: Diagnosis not present

## 2022-10-01 DIAGNOSIS — Z6834 Body mass index (BMI) 34.0-34.9, adult: Secondary | ICD-10-CM | POA: Diagnosis not present

## 2022-10-04 DIAGNOSIS — L4059 Other psoriatic arthropathy: Secondary | ICD-10-CM | POA: Diagnosis not present

## 2022-10-07 NOTE — Pre-Procedure Instructions (Signed)
Surgical Instructions    Your procedure is scheduled on October 14, 2022.  Report to [facility] Main Entrance "A" at 08:30 A.M., then check in with the Admitting office.  Call this number if you have problems the morning of surgery:  [phone]   If you have any questions prior to your surgery date call [phone]: Open Monday-Friday 8am-4pm If you experience any cold or flu symptoms such as cough, fever, chills, shortness of breath, etc. between now and your scheduled surgery, please notify us at the above number     Remember:  Do not eat or drink after midnight the night before your surgery    Take these medicines the morning of surgery with A SIP OF WATER:  Amlodipine (Norvasc) Fluticasone (Flonase) Omeprazole (Prilosec)  If needed: Acetaminophen (Tylenol) Polyethyl Glycol-Propyl Glycol (SYSTANE) eye drops   As of today, STOP taking any Aspirin (unless otherwise instructed by your surgeon) Aleve, Naproxen, Ibuprofen, Motrin, Advil, Goody's, BC's, all herbal medications, fish oil, and all vitamins. This includes Meloxicam (Mobic).           Do not wear jewelry or makeup. Do not wear lotions, powders, perfumes/cologne or deodorant. Do not shave 48 hours prior to surgery.   Do not bring valuables to the hospital. Do not wear nail polish, gel polish, artificial nails, or any other type of covering on natural nails (fingers and toes) If you have artificial nails or gel coating that need to be removed by a nail salon, please have this removed prior to surgery. Artificial nails or gel coating may interfere with anesthesia's ability to adequately monitor your vital signs.  [facility] is not responsible for any belongings or valuables.    Do NOT Smoke (Tobacco/Vaping)  24 hours prior to your procedure  If you use a CPAP at night, you may bring your mask for your overnight stay.   Contacts, glasses, hearing aids, dentures or partials may not be worn into surgery, please bring  cases for these belongings   For patients admitted to the hospital, discharge time will be determined by your treatment team.   Patients discharged the day of surgery will not be allowed to drive home, and someone needs to stay with them for 24 hours.   SURGICAL WAITING ROOM VISITATION Patients having surgery or a procedure may have no more than 2 support people in the waiting area - these visitors may rotate.   Children under the age of 50 must have an adult with them who is not the patient. If the patient needs to stay at the hospital during part of their recovery, the visitor guidelines for inpatient rooms apply. Pre-op nurse will coordinate an appropriate time for 1 support person to accompany patient in pre-op.  This support person may not rotate.   Please refer to [email or web address] for the visitor guidelines for Inpatients (after your surgery is over and you are in a regular room).    Special instructions:    Oral Hygiene is also important to reduce your risk of infection.  Remember - BRUSH YOUR TEETH THE MORNING OF SURGERY WITH YOUR REGULAR TOOTHPASTE   [facility]- Preparing For Surgery  Before surgery, you can play an important role. Because skin is not sterile, your skin needs to be as free of germs as possible. You can reduce the number of germs on your skin by washing with CHG (chlorahexidine gluconate) Soap before surgery.  CHG is an antiseptic cleaner which kills germs and bonds with the skin to  continue killing germs even after washing.     Please do not use if you have an allergy to CHG or antibacterial soaps. If your skin becomes reddened/irritated stop using the CHG.  Do not shave (including legs and underarms) for at least 48 hours prior to first CHG shower. It is OK to shave your face.  Please follow these instructions carefully.     Shower the NIGHT BEFORE SURGERY and the MORNING OF SURGERY with CHG Soap.   If  you chose to wash your hair, wash your hair first as usual with your normal shampoo. After you shampoo, rinse your hair and body thoroughly to remove the shampoo.  Then [employer] and genitals (private parts) with your normal soap and rinse thoroughly to remove soap.  After that Use CHG Soap as you would any other liquid soap. You can apply CHG directly to the skin and wash gently with a scrungie or a clean washcloth.   Apply the CHG Soap to your body ONLY FROM THE NECK DOWN.  Do not use on open wounds or open sores. Avoid contact with your eyes, ears, mouth and genitals (private parts). Wash Face and genitals (private parts)  with your normal soap.   Wash thoroughly, paying special attention to the area where your surgery will be performed.  Thoroughly rinse your body with warm water from the neck down.  DO NOT shower/wash with your normal soap after using and rinsing off the CHG Soap.  Pat yourself dry with a CLEAN TOWEL.  Wear CLEAN PAJAMAS to bed the night before surgery  Place CLEAN SHEETS on your bed the night before your surgery  DO NOT SLEEP WITH PETS.   Day of Surgery:  Take a shower with CHG soap. Wear Clean/Comfortable clothing the morning of surgery Do not apply any deodorants/lotions.   Remember to brush your teeth WITH YOUR REGULAR TOOTHPASTE.    If you received a COVID test during your pre-op visit, it is requested that you wear a mask when out in public, stay away from anyone that may not be feeling well, and notify your surgeon if you develop symptoms. If you have been in contact with anyone that has tested positive in the last 10 days, please notify your surgeon.    Please read over the following fact sheets that you were given.

## 2022-10-08 ENCOUNTER — Other Ambulatory Visit: Payer: Self-pay

## 2022-10-08 ENCOUNTER — Encounter (HOSPITAL_COMMUNITY): Payer: Self-pay

## 2022-10-08 ENCOUNTER — Encounter (HOSPITAL_COMMUNITY)
Admission: RE | Admit: 2022-10-08 | Discharge: 2022-10-08 | Disposition: A | Discharge status: Home / Self Care | Payer: Medicare Other | Source: Ambulatory Visit | Attending: Neurosurgery | Admitting: Neurosurgery

## 2022-10-08 VITALS — BP 149/69 | HR 98 | Temp 98.3°F | Resp 18 | Ht 60.0 in | Wt 188.8 lb

## 2022-10-08 DIAGNOSIS — M4802 Spinal stenosis, cervical region: Secondary | ICD-10-CM | POA: Diagnosis not present

## 2022-10-08 DIAGNOSIS — Z90722 Acquired absence of ovaries, bilateral: Secondary | ICD-10-CM | POA: Diagnosis not present

## 2022-10-08 DIAGNOSIS — R011 Cardiac murmur, unspecified: Secondary | ICD-10-CM | POA: Insufficient documentation

## 2022-10-08 DIAGNOSIS — M47814 Spondylosis without myelopathy or radiculopathy, thoracic region: Secondary | ICD-10-CM | POA: Insufficient documentation

## 2022-10-08 DIAGNOSIS — E0520 Thyrotoxicosis with toxic multinodular goiter without thyrotoxic crisis or storm: Secondary | ICD-10-CM | POA: Diagnosis not present

## 2022-10-08 DIAGNOSIS — I10 Essential (primary) hypertension: Secondary | ICD-10-CM | POA: Diagnosis not present

## 2022-10-08 DIAGNOSIS — K219 Gastro-esophageal reflux disease without esophagitis: Secondary | ICD-10-CM | POA: Diagnosis not present

## 2022-10-08 DIAGNOSIS — Z90710 Acquired absence of both cervix and uterus: Secondary | ICD-10-CM | POA: Diagnosis not present

## 2022-10-08 DIAGNOSIS — Z9049 Acquired absence of other specified parts of digestive tract: Secondary | ICD-10-CM | POA: Insufficient documentation

## 2022-10-08 DIAGNOSIS — M48061 Spinal stenosis, lumbar region without neurogenic claudication: Secondary | ICD-10-CM | POA: Insufficient documentation

## 2022-10-08 DIAGNOSIS — D447 Neoplasm of uncertain behavior of aortic body and other paraganglia: Secondary | ICD-10-CM | POA: Insufficient documentation

## 2022-10-08 DIAGNOSIS — I080 Rheumatic disorders of both mitral and aortic valves: Secondary | ICD-10-CM | POA: Diagnosis not present

## 2022-10-08 DIAGNOSIS — Z87891 Personal history of nicotine dependence: Secondary | ICD-10-CM | POA: Insufficient documentation

## 2022-10-08 DIAGNOSIS — Z01812 Encounter for preprocedural laboratory examination: Secondary | ICD-10-CM | POA: Diagnosis not present

## 2022-10-08 DIAGNOSIS — M4317 Spondylolisthesis, lumbosacral region: Secondary | ICD-10-CM | POA: Diagnosis not present

## 2022-10-08 DIAGNOSIS — M47812 Spondylosis without myelopathy or radiculopathy, cervical region: Secondary | ICD-10-CM | POA: Insufficient documentation

## 2022-10-08 DIAGNOSIS — M50321 Other cervical disc degeneration at C4-C5 level: Secondary | ICD-10-CM | POA: Diagnosis not present

## 2022-10-08 DIAGNOSIS — Z01818 Encounter for other preprocedural examination: Secondary | ICD-10-CM

## 2022-10-08 HISTORY — DX: Nontoxic multinodular goiter: E04.2

## 2022-10-08 HISTORY — DX: Pneumonia, unspecified organism: J18.9

## 2022-10-08 HISTORY — DX: Essential (primary) hypertension: I10

## 2022-10-08 HISTORY — DX: Unspecified macular degeneration: H35.30

## 2022-10-08 HISTORY — DX: Gastro-esophageal reflux disease without esophagitis: K21.9

## 2022-10-08 HISTORY — DX: Thyrotoxicosis, unspecified without thyrotoxic crisis or storm: E05.90

## 2022-10-08 HISTORY — DX: Neoplasm of uncertain behavior of aortic body and other paraganglia: D44.7

## 2022-10-08 LAB — BASIC METABOLIC PANEL
Anion gap: 9 (ref 5–15)
BUN: 16 mg/dL (ref 8–23)
CO2: 29 mmol/L (ref 22–32)
Calcium: 9.2 mg/dL (ref 8.9–10.3)
Chloride: 100 mmol/L (ref 98–111)
Creatinine, Ser: 0.65 mg/dL (ref 0.44–1.00)
GFR, Estimated: >60 mL/min (ref >60)
Glucose, Bld: 148 mg/dL — ABNORMAL HIGH (ref 70–99)
Potassium: 3.4 mmol/L — ABNORMAL LOW (ref 3.5–5.1)
Sodium: 138 mmol/L (ref 135–145)

## 2022-10-08 LAB — CBC
HCT: 43 % (ref 36.0–46.0)
Hemoglobin: 14.4 g/dL (ref 12.0–15.0)
MCH: 31.2 pg (ref 26.0–34.0)
MCHC: 33.5 g/dL (ref 30.0–36.0)
MCV: 93.1 fL (ref 80.0–100.0)
Platelets: 289 10*3/uL (ref 150–400)
RBC: 4.62 MIL/uL (ref 3.87–5.11)
RDW: 13.4 % (ref 11.5–15.5)
WBC: 6 10*3/uL (ref 4.0–10.5)
nRBC: 0 % (ref 0.0–0.2)

## 2022-10-08 LAB — SURGICAL PCR SCREEN
MRSA, PCR: NEGATIVE
Staphylococcus aureus: NEGATIVE

## 2022-10-08 NOTE — Progress Notes (Addendum)
PCP - Dr. Harrington Challenger at [facility], [place], [place] Cardiologist - Dr. Candee Furbish  PPM/ICD - n/a Device Orders - n/a Rep Notified - n/a  Chest x-ray - n/a EKG - 08/01/22 Stress Test - 2009 ECHO - 08/22/22 Cardiac Cath - n/a  Sleep Study - denies CPAP - n/a  Fasting Blood Sugar - n/a Checks Blood Sugar _____ times a day- n/a  Last dose of GLP1 agonist-  n/a GLP1 instructions: n/a  Blood Thinner Instructions: n/a Aspirin Instructions: n/a  ERAS Protcol - NPO  COVID TEST- n/a   Anesthesia review: Yes. Clearance note from Dr. Marlou Porch on 08/01/22 and Ambrose Pancoast, NP on 09/02/22.  Patient denies shortness of breath, fever, cough and chest pain at PAT appointment   All instructions explained to the patient, with a verbal understanding of the material. Patient agrees to go over the instructions while at home for a better understanding. Patient also instructed to self quarantine after being tested for COVID-19. The opportunity to ask questions was provided.

## 2022-10-09 ENCOUNTER — Encounter (HOSPITAL_COMMUNITY): Payer: Self-pay

## 2022-10-09 DIAGNOSIS — I10 Essential (primary) hypertension: Secondary | ICD-10-CM | POA: Diagnosis not present

## 2022-10-09 DIAGNOSIS — E782 Mixed hyperlipidemia: Secondary | ICD-10-CM | POA: Diagnosis not present

## 2022-10-09 DIAGNOSIS — E1169 Type 2 diabetes mellitus with other specified complication: Secondary | ICD-10-CM | POA: Diagnosis not present

## 2022-10-09 DIAGNOSIS — L4050 Arthropathic psoriasis, unspecified: Secondary | ICD-10-CM | POA: Diagnosis not present

## 2022-10-09 NOTE — Progress Notes (Addendum)
Anesthesia Chart Review:  Case: [id number] Date/Time: 10/14/22 1145   Procedure: Laminectomy and Foraminotomy - right - L3-L4 - L4-L5 (Right: Back)   Anesthesia type: General   Pre-op diagnosis: Stenosis   Location: [facility] OR ROOM 20 / [facility]   Surgeons: Earnie Larsson, MD       DISCUSSION: Patient is an 87 year old female scheduled for the above procedure.   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  She saw cardiologist Dr. Marlou Porch on 08/01/22 for follow-up and preoperative evaluation. She has known mild-moderate AS, so echo ordered. This was done on 08/22/22 and showed normal LVEF, stable mild-moderate AS/AI. Follow-up preoperative cardiology input outlined by Rebekah Chesterfield, NP on 09/02/22, "Given past medical history and time since last visit, based on ACC/AHA guidelines, Lisa [name] is at acceptable risk for the planned procedure without further cardiovascular testing.  Patient had 2D echo completed per Dr. Marlou Porch that showed no progression of aortic stenosis.She may proceed with her surgery with low to moderate overall risk based mainly upon her age.    Regarding ASA therapy may be stopped 5-7 days prior to surgery with a plan to resume it as soon as felt to be feasible from a surgical standpoint in the post-operative period."  Anesthesia team to evaluate on the day of surgery. BMI is nearly 37. She has a multi-nodular goiter which is followed by endocrinology and general surgery. 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    VS: BP (!)  149/69   Pulse 98   Temp 36.8 C   Resp 18   Ht 5' (1.524 m)   Wt 85.6 kg   LMP  (LMP Unknown)   SpO2 98%   BMI 36.87 kg/m    PROVIDERS: Lawerance Cruel, MD or Marda Stalker, [place] is PCP [facility]) Candee Furbish, MD is cardiologist Delrae Rend, MD is endocrinologist Vicie Mutters, MD is ENT Armandina Gemma, MD is general surgeon. Evaluation 05/15/22 for multinodular goiter.  FNA of left thyroid nodule was benign.  She was not having any significant compressive symptoms.  Repeat thyroid lab studies were normal, so monitoring with ultrasound imaging planned at that time.   LABS: Labs reviewed: Acceptable for surgery. (all labs ordered are listed, but only abnormal results are displayed)  Labs Reviewed  BASIC METABOLIC PANEL - Abnormal; Notable for the following components:      Result Value   Potassium 3.4 (*)    Glucose, Bld 148 (*)    All other components within normal limits  SURGICAL PCR SCREEN  CBC     IMAGES: US Thyroid 08/23/22: IMPRESSION: 1. Enlarged and heterogeneous multinodular thyroid gland. 2. Stable appearance of nodules labeled 1 and 2, which were previously biopsied. Correlate with biopsy results. 3. Nodule labeled 3 (1.4 cm TR 4) is a new thyroid nodule in the inferior left thyroid lobe, not discretely visualized on previous examinations. This nodule meets criteria for follow-up ultrasound in 1 year. A total follow-up of 5 years is recommended, with this exam serving as a baseline.   The above is in keeping with the ACR TI-RADS recommendations - J Am Coll Radiol  [id number].    MRI Temporal bone 07/18/22 ([facility]): IMPRESSION:  1. Stable size and configuration of right jugular Paraganglioma  since 2019. And right mastoid effusion is chronic and probably  post-treatment related.  However, subtle enhancement along the walls of the right internal  auditory canal has developed since 2019, but is nonspecific. And  other bilateral  internal auditory structures appear to remain  normal.  2. Otherwise normal for age MRI appearance of the Brain.    MRI C-spine 05/07/22: IMPRESSION: - At C4-5 disc bulging, facet and ligamentum flavum hypertrophy with moderate spinal stenosis and severe bilateral foraminal stenosis; no cord signal changes. - At C5-6 disc bulging and facet hypertrophy with moderate spinal stenosis and severe bilateral foraminal stenosis; no cord signal changes. - At C6-7 disc bulging and facet hypertrophy with mild spinal stenosis and severe bilateral foraminal stenosis. - At C3-4 disc bulging with severe right foraminal stenosis.    MRI L Spine 02/18/22: IMPRESSION: 1. Mild central canal stenosis L1-2 with mild progression since September 2022 MRI 2. Mild central canal stenosis L2-3. Mild progression of right subarticular stenosis L2-3 3. Mild central canal stenosis L3-4 with mild progression. Moderate right subarticular stenosis with mild progression 4. Moderate central canal stenosis L4-5 with moderate subarticular stenosis on the right, unchanged. 5. Mild anterolisthesis L5-S1 due to severe facet degeneration. No significant neural impingement.    MRI T-spine 02/18/22: IMPRESSION: Mild thoracic degenerative change without significant stenosis   Multiple thyroid nodules. These have been evaluated previously with last ultrasound thyroid 2018. Prior radioactive iodine treatment also performed for hyperthyroidism. Thyroid ultrasound recommended (ref: J Am Coll Radiol. 2015 Feb;12(2): 143-50). -  Enlargement of the right lobe of the thyroid with multiple nodules. Right-sided nodules measure 15 mm, 17 mm, 16 mm. 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 15 mm left thyroid nodule.   EKG: 08/01/2022: Normal sinus rhythm.  Cannot rule out anterior infarct, age undetermined.   CV: Echo 08/22/22: IMPRESSIONS   1. Left ventricular ejection fraction, by estimation, is 70 to 75%. The  left ventricle has  hyperdynamic function. The left ventricle has no  regional wall motion abnormalities. There is mild concentric left  ventricular hypertrophy. Left ventricular  diastolic parameters are consistent with Grade I diastolic dysfunction  (impaired relaxation).   2. The aortic valve is tricuspid. There is moderate calcification of the  aortic valve. There is severe thickening of the aortic valve. Aortic valve  regurgitation is mild to moderate. Mild to moderate aortic valve stenosis.  Aortic valve area, by VTI  measures 1.55 cm. Aortic valve mean gradient measures 15.0 mmHg. Aortic  valve Vmax measures 2.70 m/s.   3. Right ventricular systolic function is normal. The right ventricular  size is normal. There is normal pulmonary artery systolic pressure. The  estimated right ventricular systolic pressure is Q000111Q mmHg.   4. Left atrial size was mildly dilated.   5. The mitral valve is grossly normal. Trivial mitral valve  regurgitation. Moderate mitral annular calcification.   6. The inferior vena cava is normal in size with greater than 50%  respiratory variability, suggesting right atrial pressure of 3 mmHg.   Comparison(s): Prior TTE in 11/2021 with mean AoV gradient 17.71mmHg, Vmax  3.76m/s which appears relatively stable on current study.   Past Medical History:  Diagnosis Date   Arthritis    DDD (degenerative disc disease), cervical    Deafness in right ear    Diverticulosis    GERD (gastroesophageal reflux disease)  Hearing loss in left ear    Heart murmur    Hypertension    Kidney stones    Macular degeneration    Multiple thyroid nodules    Pneumonia    Psoriasis     Past Surgical History:  Procedure Laterality Date   APPENDECTOMY     BILATERAL SALPINGOOPHORECTOMY  07/22/1994   CATARACT EXTRACTION     CHOLECYSTECTOMY  07/22/2000   COLONOSCOPY  2003/2009/2015   gamma knife     TOTAL ABDOMINAL HYSTERECTOMY  07/22/1977   FIBROIDS     MEDICATIONS:  acetaminophen  (TYLENOL) 650 MG CR tablet   amLODipine (NORVASC) 5 MG tablet   Cholecalciferol (VITAMIN D) 2000 units tablet   fluticasone (FLONASE) 50 MCG/ACT nasal spray   folic acid (FOLVITE) 1 MG tablet   losartan-hydrochlorothiazide (HYZAAR) 100-25 MG tablet   meloxicam (MOBIC) 15 MG tablet   methotrexate (RHEUMATREX) 2.5 MG tablet   omeprazole (PRILOSEC) 20 MG capsule   phenylephrine-shark liver oil-mineral oil-petrolatum (PREPARATION H) 0.25-14-74.9 % rectal ointment   Polyethyl Glycol-Propyl Glycol (SYSTANE) 0.4-0.3 % SOLN   polyethylene glycol (MIRALAX / GLYCOLAX) 17 g packet   zinc oxide (BALMEX) 11.3 % CREA cream   No current facility-administered medications for this encounter.    Myra Gianotti, PA-C Surgical Short Stay/Anesthesiology [facility] Phone [phone] [facility] Phone [phone] 10/09/2022 3:39 PM

## 2022-10-09 NOTE — Anesthesia Preprocedure Evaluation (Addendum)
Anesthesia Evaluation  Patient identified by MRN, date of birth, ID band Patient awake    Reviewed: Allergy & Precautions, NPO status , Patient's Chart, lab work & pertinent test results  History of Anesthesia Complications Negative for: history of anesthetic complications  Airway Mallampati: II  TM Distance: >3 FB Neck ROM: Full    Dental  (+) Dental Advisory Given   Pulmonary former smoker   breath sounds clear to auscultation       Cardiovascular hypertension, Pt. on medications + Valvular Problems/Murmurs (mod, mean gradient 15 mmHg) AI and AS  Rhythm:Regular Rate:Normal + Systolic murmurs and + Diastolic murmurs Echo [id number]: IMPRESSIONS  1. Left ventricular ejection fraction, by estimation, is 70 to 75%. The  left ventricle has hyperdynamic function. The left ventricle has no  regional wall motion abnormalities. There is mild concentric left  ventricular hypertrophy. Left ventricular  diastolic parameters are consistent with Grade I diastolic dysfunction  (impaired relaxation).  2. The aortic valve is tricuspid. There is moderate calcification of the  aortic valve. There is severe thickening of the aortic valve. Aortic valve  regurgitation is mild to moderate. Mild to moderate aortic valve stenosis.  Aortic valve area, by VTI  measures 1.55 cm. Aortic valve mean gradient measures 15.0 mmHg. Aortic  valve Vmax measures 2.70 m/s.  3. Right ventricular systolic function is normal. The right ventricular  size is normal. There is normal pulmonary artery systolic pressure. The  estimated right ventricular systolic pressure is Q000111Q mmHg.  4. Left atrial size was mildly dilated.  5. The mitral valve is grossly normal. Trivial mitral valve  regurgitation. Moderate mitral annular calcification.  6. The inferior vena cava is normal in size with greater than 50%  respiratory variability, suggesting right atrial pressure of 3  mmHg.  - Comparison(s): Prior TTE in 11/2021 with mean AoV gradient 17.58mmHg, Vmax  3.36m/s which appears relatively stable on current study.     Neuro/Psych   Anxiety     Back pain    GI/Hepatic ,GERD  Medicated,,  Endo/Other  diabetes (diet controlled) Hyperthyroidism   Renal/GU      Musculoskeletal  (+) Arthritis ,    Abdominal   Peds  Hematology negative hematology ROS (+)   Anesthesia Other Findings   Reproductive/Obstetrics                             Anesthesia Physical Anesthesia Plan  ASA: 3  Anesthesia Plan: General   Post-op Pain Management: Tylenol PO (pre-op)*   Induction: Intravenous  PONV Risk Score and Plan: 3 and Ondansetron, Dexamethasone and Treatment may vary due to age or medical condition  Airway Management Planned: Oral ETT  Additional Equipment: Arterial line  Intra-op Plan:   Post-operative Plan: Extubation in OR  Informed Consent: I have reviewed the patients History and Physical, chart, labs and discussed the procedure including the risks, benefits and alternatives for the proposed anesthesia with the patient or authorized representative who has indicated his/her understanding and acceptance.     Dental advisory given  Plan Discussed with: CRNA and Surgeon  Anesthesia Plan Comments: (PAT note written 10/09/2022 by Myra Gianotti, PA-C. Mild-moderate AI/AS followed by Dr. Marlou Porch.  She does have multinodular goiter, tracheal deviation to the left on July 2023 MRI T-spine.)       Anesthesia Quick Evaluation

## 2022-10-14 ENCOUNTER — Ambulatory Visit (HOSPITAL_COMMUNITY): Payer: Medicare Other | Admitting: Vascular Surgery

## 2022-10-14 ENCOUNTER — Other Ambulatory Visit: Payer: Self-pay

## 2022-10-14 ENCOUNTER — Encounter (HOSPITAL_COMMUNITY): Payer: Self-pay | Admitting: Neurosurgery

## 2022-10-14 ENCOUNTER — Observation Stay (HOSPITAL_COMMUNITY)
Admission: RE | Admit: 2022-10-14 | Discharge: 2022-10-16 | Disposition: A | Discharge status: Skilled Nursing Facility | Payer: Medicare Other | Attending: Neurosurgery | Admitting: Neurosurgery

## 2022-10-14 ENCOUNTER — Encounter (HOSPITAL_COMMUNITY)
Admission: RE | Disposition: A | Discharge status: Skilled Nursing Facility | Payer: Self-pay | Source: Home / Self Care | Attending: Neurosurgery

## 2022-10-14 ENCOUNTER — Ambulatory Visit (HOSPITAL_COMMUNITY): Payer: Medicare Other

## 2022-10-14 ENCOUNTER — Ambulatory Visit (HOSPITAL_BASED_OUTPATIENT_CLINIC_OR_DEPARTMENT_OTHER): Payer: Medicare Other | Admitting: Certified Registered Nurse Anesthetist

## 2022-10-14 DIAGNOSIS — M4726 Other spondylosis with radiculopathy, lumbar region: Secondary | ICD-10-CM | POA: Diagnosis not present

## 2022-10-14 DIAGNOSIS — Z87891 Personal history of nicotine dependence: Secondary | ICD-10-CM | POA: Insufficient documentation

## 2022-10-14 DIAGNOSIS — E039 Hypothyroidism, unspecified: Secondary | ICD-10-CM | POA: Diagnosis not present

## 2022-10-14 DIAGNOSIS — M48061 Spinal stenosis, lumbar region without neurogenic claudication: Secondary | ICD-10-CM

## 2022-10-14 DIAGNOSIS — I10 Essential (primary) hypertension: Secondary | ICD-10-CM | POA: Insufficient documentation

## 2022-10-14 DIAGNOSIS — Z79899 Other long term (current) drug therapy: Secondary | ICD-10-CM | POA: Insufficient documentation

## 2022-10-14 DIAGNOSIS — M48062 Spinal stenosis, lumbar region with neurogenic claudication: Secondary | ICD-10-CM | POA: Insufficient documentation

## 2022-10-14 DIAGNOSIS — M5126 Other intervertebral disc displacement, lumbar region: Secondary | ICD-10-CM | POA: Diagnosis not present

## 2022-10-14 HISTORY — PX: LUMBAR LAMINECTOMY/DECOMPRESSION MICRODISCECTOMY: SHX5026

## 2022-10-14 SURGERY — LUMBAR LAMINECTOMY/DECOMPRESSION MICRODISCECTOMY 2 LEVELS
Anesthesia: General | Site: Back | Laterality: Right

## 2022-10-14 MED ORDER — ACETAMINOPHEN 500 MG PO TABS: 1000.0000 mg | ORAL_TABLET | Freq: Once | ORAL | Status: DC

## 2022-10-14 MED ORDER — HYDROCHLOROTHIAZIDE 25 MG PO TABS
25.0000 mg | ORAL_TABLET | Freq: Every day | ORAL | Status: DC
  Administered 2022-10-14 – 2022-10-16 (×3): 25 mg via ORAL
  Filled 2022-10-14 (×3): qty 1

## 2022-10-14 MED ORDER — LOSARTAN POTASSIUM 50 MG PO TABS
100.0000 mg | ORAL_TABLET | Freq: Every day | ORAL | Status: DC
  Administered 2022-10-14 – 2022-10-16 (×3): 100 mg via ORAL
  Filled 2022-10-14 (×3): qty 2

## 2022-10-14 MED ORDER — ONDANSETRON HCL 4 MG/2ML IJ SOLN
INTRAMUSCULAR | Status: AC
  Filled 2022-10-14: qty 4

## 2022-10-14 MED ORDER — OXYCODONE HCL 5 MG PO TABS
ORAL_TABLET | ORAL | Status: AC
  Filled 2022-10-14: qty 1

## 2022-10-14 MED ORDER — ONDANSETRON HCL 4 MG PO TABS: 4.0000 mg | ORAL_TABLET | Freq: Four times a day (QID) | ORAL | Status: DC | PRN

## 2022-10-14 MED ORDER — PROPOFOL 10 MG/ML IV BOLUS
INTRAVENOUS | Status: DC | PRN
  Administered 2022-10-14: 30 mg via INTRAVENOUS
  Administered 2022-10-14: 120 mg via INTRAVENOUS

## 2022-10-14 MED ORDER — AMLODIPINE BESYLATE 5 MG PO TABS
5.0000 mg | ORAL_TABLET | Freq: Every day | ORAL | Status: DC
  Administered 2022-10-15 – 2022-10-16 (×2): 5 mg via ORAL
  Filled 2022-10-14 (×2): qty 1

## 2022-10-14 MED ORDER — CYCLOBENZAPRINE HCL 5 MG PO TABS
5.0000 mg | ORAL_TABLET | Freq: Three times a day (TID) | ORAL | Status: DC | PRN
  Administered 2022-10-15: 5 mg via ORAL
  Filled 2022-10-14: qty 1

## 2022-10-14 MED ORDER — HYDROMORPHONE HCL 1 MG/ML IJ SOLN: 1.0000 mg | INTRAMUSCULAR | Status: DC | PRN

## 2022-10-14 MED ORDER — HYDROCODONE-ACETAMINOPHEN 10-325 MG PO TABS
1.0000 | ORAL_TABLET | ORAL | Status: DC | PRN
  Administered 2022-10-15: 1 via ORAL
  Filled 2022-10-14: qty 1

## 2022-10-14 MED ORDER — DEXAMETHASONE SODIUM PHOSPHATE 10 MG/ML IJ SOLN
INTRAMUSCULAR | Status: AC
  Filled 2022-10-14: qty 2

## 2022-10-14 MED ORDER — EPHEDRINE SULFATE-NACL 50-0.9 MG/10ML-% IV SOSY
PREFILLED_SYRINGE | INTRAVENOUS | Status: DC | PRN
  Administered 2022-10-14: 5 mg via INTRAVENOUS

## 2022-10-14 MED ORDER — THROMBIN 20000 UNITS EX SOLR
CUTANEOUS | Status: DC | PRN
  Administered 2022-10-14: 20 mL via TOPICAL

## 2022-10-14 MED ORDER — ROCURONIUM BROMIDE 10 MG/ML (PF) SYRINGE
PREFILLED_SYRINGE | INTRAVENOUS | Status: DC | PRN
  Administered 2022-10-14: 60 mg via INTRAVENOUS

## 2022-10-14 MED ORDER — ORAL CARE MOUTH RINSE: 15.0000 mL | Freq: Once | OROMUCOSAL | Status: AC

## 2022-10-14 MED ORDER — MIDAZOLAM HCL 2 MG/2ML IJ SOLN: 0.5000 mg | Freq: Once | INTRAMUSCULAR | Status: DC | PRN

## 2022-10-14 MED ORDER — PROMETHAZINE HCL 25 MG/ML IJ SOLN: 6.2500 mg | INTRAMUSCULAR | Status: DC | PRN

## 2022-10-14 MED ORDER — ACETAMINOPHEN 650 MG RE SUPP: 650.0000 mg | RECTAL | Status: DC | PRN

## 2022-10-14 MED ORDER — GLYCOPYRROLATE PF 0.2 MG/ML IJ SOSY
PREFILLED_SYRINGE | INTRAMUSCULAR | Status: DC | PRN
  Administered 2022-10-14 (×2): .1 mg via INTRAVENOUS

## 2022-10-14 MED ORDER — HYDROMORPHONE HCL 1 MG/ML IJ SOLN: 0.2500 mg | INTRAMUSCULAR | Status: DC | PRN

## 2022-10-14 MED ORDER — FENTANYL CITRATE (PF) 250 MCG/5ML IJ SOLN
INTRAMUSCULAR | Status: AC
  Filled 2022-10-14: qty 5

## 2022-10-14 MED ORDER — HYDROCODONE-ACETAMINOPHEN 5-325 MG PO TABS
1.0000 | ORAL_TABLET | ORAL | Status: DC | PRN
  Administered 2022-10-14 – 2022-10-16 (×5): 1 via ORAL
  Filled 2022-10-14 (×5): qty 1

## 2022-10-14 MED ORDER — KETOROLAC TROMETHAMINE 15 MG/ML IJ SOLN
7.5000 mg | Freq: Four times a day (QID) | INTRAMUSCULAR | Status: AC
  Administered 2022-10-14 – 2022-10-15 (×4): 7.5 mg via INTRAVENOUS
  Filled 2022-10-14 (×4): qty 1

## 2022-10-14 MED ORDER — VANCOMYCIN HCL IN DEXTROSE 1-5 GM/200ML-% IV SOLN
1000.0000 mg | INTRAVENOUS | Status: AC
  Administered 2022-10-14: 1000 mg via INTRAVENOUS
  Filled 2022-10-14: qty 200

## 2022-10-14 MED ORDER — POLYVINYL ALCOHOL 1.4 % OP SOLN
1.0000 [drp] | Freq: Three times a day (TID) | OPHTHALMIC | Status: DC | PRN
  Administered 2022-10-15: 1 [drp] via OPHTHALMIC
  Filled 2022-10-14: qty 15

## 2022-10-14 MED ORDER — THROMBIN 5000 UNITS EX SOLR
CUTANEOUS | Status: AC
  Filled 2022-10-14: qty 10000

## 2022-10-14 MED ORDER — SODIUM CHLORIDE 0.9% FLUSH: 3.0000 mL | INTRAVENOUS | Status: DC | PRN

## 2022-10-14 MED ORDER — PANTOPRAZOLE SODIUM 40 MG PO TBEC
40.0000 mg | DELAYED_RELEASE_TABLET | Freq: Every day | ORAL | Status: DC
  Administered 2022-10-15 – 2022-10-16 (×2): 40 mg via ORAL
  Filled 2022-10-14 (×3): qty 1

## 2022-10-14 MED ORDER — CHLORHEXIDINE GLUCONATE 0.12 % MT SOLN
15.0000 mL | Freq: Once | OROMUCOSAL | Status: AC
  Administered 2022-10-14: 15 mL via OROMUCOSAL
  Filled 2022-10-14: qty 15

## 2022-10-14 MED ORDER — FLUTICASONE PROPIONATE 50 MCG/ACT NA SUSP
1.0000 | Freq: Every day | NASAL | Status: DC
  Administered 2022-10-14 – 2022-10-16 (×3): 1 via NASAL
  Filled 2022-10-14: qty 16

## 2022-10-14 MED ORDER — CHLORHEXIDINE GLUCONATE CLOTH 2 % EX PADS: 6.0000 | MEDICATED_PAD | Freq: Once | CUTANEOUS | Status: DC

## 2022-10-14 MED ORDER — ONDANSETRON HCL 4 MG/2ML IJ SOLN: 4.0000 mg | Freq: Four times a day (QID) | INTRAMUSCULAR | Status: DC | PRN

## 2022-10-14 MED ORDER — ACETAMINOPHEN 325 MG PO TABS: 650.0000 mg | ORAL_TABLET | ORAL | Status: DC | PRN

## 2022-10-14 MED ORDER — LIDOCAINE 2% (20 MG/ML) 5 ML SYRINGE
INTRAMUSCULAR | Status: DC | PRN
  Administered 2022-10-14: 50 mg via INTRAVENOUS

## 2022-10-14 MED ORDER — MENTHOL 3 MG MT LOZG
1.0000 | LOZENGE | OROMUCOSAL | Status: DC | PRN
  Filled 2022-10-14: qty 9

## 2022-10-14 MED ORDER — ONDANSETRON HCL 4 MG/2ML IJ SOLN
INTRAMUSCULAR | Status: DC | PRN
  Administered 2022-10-14: 4 mg via INTRAVENOUS

## 2022-10-14 MED ORDER — DEXAMETHASONE SODIUM PHOSPHATE 10 MG/ML IJ SOLN
INTRAMUSCULAR | Status: DC | PRN
  Administered 2022-10-14: 5 mg via INTRAVENOUS

## 2022-10-14 MED ORDER — FENTANYL CITRATE (PF) 250 MCG/5ML IJ SOLN
INTRAMUSCULAR | Status: DC | PRN
  Administered 2022-10-14: 150 ug via INTRAVENOUS

## 2022-10-14 MED ORDER — CEFAZOLIN SODIUM-DEXTROSE 1-4 GM/50ML-% IV SOLN
1.0000 g | Freq: Three times a day (TID) | INTRAVENOUS | Status: AC
  Administered 2022-10-14 – 2022-10-15 (×2): 1 g via INTRAVENOUS
  Filled 2022-10-14 (×2): qty 50

## 2022-10-14 MED ORDER — LACTATED RINGERS IV SOLN: INTRAVENOUS | Status: DC

## 2022-10-14 MED ORDER — OXYCODONE HCL 5 MG PO TABS
5.0000 mg | ORAL_TABLET | Freq: Once | ORAL | Status: AC | PRN
  Administered 2022-10-14: 5 mg via ORAL

## 2022-10-14 MED ORDER — BUPIVACAINE HCL (PF) 0.25 % IJ SOLN
INTRAMUSCULAR | Status: AC
  Filled 2022-10-14: qty 30

## 2022-10-14 MED ORDER — OXYCODONE HCL 5 MG/5ML PO SOLN: 5.0000 mg | Freq: Once | ORAL | Status: AC | PRN

## 2022-10-14 MED ORDER — ROCURONIUM BROMIDE 10 MG/ML (PF) SYRINGE
PREFILLED_SYRINGE | INTRAVENOUS | Status: AC
  Filled 2022-10-14: qty 20

## 2022-10-14 MED ORDER — PHENOL 1.4 % MT LIQD: 1.0000 | OROMUCOSAL | Status: DC | PRN

## 2022-10-14 MED ORDER — FOLIC ACID 1 MG PO TABS
1.0000 mg | ORAL_TABLET | Freq: Every day | ORAL | Status: DC
  Administered 2022-10-14 – 2022-10-16 (×3): 1 mg via ORAL
  Filled 2022-10-14 (×3): qty 1

## 2022-10-14 MED ORDER — LIDOCAINE 2% (20 MG/ML) 5 ML SYRINGE
INTRAMUSCULAR | Status: AC
  Filled 2022-10-14: qty 10

## 2022-10-14 MED ORDER — VITAMIN D 25 MCG (1000 UNIT) PO TABS
2000.0000 [IU] | ORAL_TABLET | ORAL | Status: DC
  Administered 2022-10-15: 2000 [IU] via ORAL
  Filled 2022-10-14: qty 2

## 2022-10-14 MED ORDER — LOSARTAN POTASSIUM-HCTZ 100-25 MG PO TABS: 1.0000 | ORAL_TABLET | Freq: Every day | ORAL | Status: DC

## 2022-10-14 MED ORDER — PHENYLEPHRINE HCL-NACL 20-0.9 MG/250ML-% IV SOLN
INTRAVENOUS | Status: DC | PRN
  Administered 2022-10-14: 20 ug/min via INTRAVENOUS

## 2022-10-14 MED ORDER — THROMBIN 20000 UNITS EX SOLR
CUTANEOUS | Status: AC
  Filled 2022-10-14: qty 20000

## 2022-10-14 MED ORDER — SODIUM CHLORIDE 0.9% FLUSH
3.0000 mL | Freq: Two times a day (BID) | INTRAVENOUS | Status: DC
  Administered 2022-10-14 – 2022-10-15 (×3): 3 mL via INTRAVENOUS

## 2022-10-14 MED ORDER — 0.9 % SODIUM CHLORIDE (POUR BTL) OPTIME
TOPICAL | Status: DC | PRN
  Administered 2022-10-14: 1000 mL

## 2022-10-14 MED ORDER — SUGAMMADEX SODIUM 200 MG/2ML IV SOLN
INTRAVENOUS | Status: DC | PRN
  Administered 2022-10-14: 160 mg via INTRAVENOUS

## 2022-10-14 MED ORDER — PROPOFOL 10 MG/ML IV BOLUS
INTRAVENOUS | Status: AC
  Filled 2022-10-14: qty 20

## 2022-10-14 MED ORDER — BUPIVACAINE HCL (PF) 0.25 % IJ SOLN
INTRAMUSCULAR | Status: DC | PRN
  Administered 2022-10-14: 20 mL

## 2022-10-14 MED ORDER — SODIUM CHLORIDE 0.9 % IV SOLN
250.0000 mL | INTRAVENOUS | Status: DC
  Administered 2022-10-14: 250 mL via INTRAVENOUS

## 2022-10-14 SURGICAL SUPPLY — 50 items
ADH SKN CLS APL DERMABOND .7 (GAUZE/BANDAGES/DRESSINGS) ×1
APL SKNCLS STERI-STRIP NONHPOA (GAUZE/BANDAGES/DRESSINGS) ×1
BAG COUNTER SPONGE SURGICOUNT (BAG) ×2 IMPLANT
BAG DECANTER FOR FLEXI CONT (MISCELLANEOUS) ×2 IMPLANT
BAG SPNG CNTER NS LX DISP (BAG) ×1
BAND INSRT 18 STRL LF DISP RB (MISCELLANEOUS) ×2
BAND RUBBER #18 3X1/16 STRL (MISCELLANEOUS) ×4 IMPLANT
BENZOIN TINCTURE PRP APPL 2/3 (GAUZE/BANDAGES/DRESSINGS) ×2 IMPLANT
BLADE CLIPPER SURG (BLADE) IMPLANT
BUR CUTTER 7.0 ROUND (BURR) ×2 IMPLANT
CANISTER SUCT 3000ML PPV (MISCELLANEOUS) ×2 IMPLANT
DERMABOND ADVANCED .7 DNX12 (GAUZE/BANDAGES/DRESSINGS) ×2 IMPLANT
DRAPE HALF SHEET 40X57 (DRAPES) IMPLANT
DRAPE LAPAROTOMY 100X72X124 (DRAPES) ×2 IMPLANT
DRAPE MICROSCOPE SLANT 54X150 (MISCELLANEOUS) ×2 IMPLANT
DRAPE SURG 17X23 STRL (DRAPES) ×4 IMPLANT
DRSG OPSITE POSTOP 4X6 (GAUZE/BANDAGES/DRESSINGS) IMPLANT
DURAPREP 26ML APPLICATOR (WOUND CARE) ×2 IMPLANT
ELECT REM PT RETURN 9FT ADLT (ELECTROSURGICAL) ×1
ELECTRODE REM PT RTRN 9FT ADLT (ELECTROSURGICAL) ×2 IMPLANT
GAUZE 4X4 16PLY ~~LOC~~+RFID DBL (SPONGE) IMPLANT
GAUZE SPONGE 4X4 12PLY STRL (GAUZE/BANDAGES/DRESSINGS) ×2 IMPLANT
GLOVE BIO SURGEON STRL SZ 6.5 (GLOVE) ×2 IMPLANT
GLOVE BIOGEL PI IND STRL 6.5 (GLOVE) ×2 IMPLANT
GLOVE ECLIPSE 9.0 STRL (GLOVE) ×2 IMPLANT
GLOVE EXAM NITRILE XL STR (GLOVE) IMPLANT
GOWN STRL REUS W/ TWL LRG LVL3 (GOWN DISPOSABLE) IMPLANT
GOWN STRL REUS W/ TWL XL LVL3 (GOWN DISPOSABLE) ×2 IMPLANT
GOWN STRL REUS W/TWL 2XL LVL3 (GOWN DISPOSABLE) IMPLANT
GOWN STRL REUS W/TWL LRG LVL3 (GOWN DISPOSABLE)
GOWN STRL REUS W/TWL XL LVL3 (GOWN DISPOSABLE) ×1
KIT BASIN OR (CUSTOM PROCEDURE TRAY) ×2 IMPLANT
KIT TURNOVER KIT B (KITS) ×2 IMPLANT
NDL SPNL 22GX3.5 QUINCKE BK (NEEDLE) IMPLANT
NEEDLE HYPO 22GX1.5 SAFETY (NEEDLE) ×2 IMPLANT
NEEDLE SPNL 22GX3.5 QUINCKE BK (NEEDLE) IMPLANT
NS IRRIG 1000ML POUR BTL (IV SOLUTION) ×2 IMPLANT
PACK LAMINECTOMY NEURO (CUSTOM PROCEDURE TRAY) ×2 IMPLANT
PAD ARMBOARD 7.5X6 YLW CONV (MISCELLANEOUS) ×6 IMPLANT
SOL ELECTROSURG ANTI STICK (MISCELLANEOUS)
SOLUTION ELECTROSURG ANTI STCK (MISCELLANEOUS) ×2 IMPLANT
SPIKE FLUID TRANSFER (MISCELLANEOUS) ×2 IMPLANT
SPONGE SURGIFOAM ABS GEL 100 (HEMOSTASIS) IMPLANT
SPONGE SURGIFOAM ABS GEL SZ50 (HEMOSTASIS) ×2 IMPLANT
STRIP CLOSURE SKIN 1/2X4 (GAUZE/BANDAGES/DRESSINGS) ×2 IMPLANT
SUT VIC AB 2-0 CT1 18 (SUTURE) ×2 IMPLANT
SUT VIC AB 3-0 SH 8-18 (SUTURE) ×2 IMPLANT
TOWEL GREEN STERILE (TOWEL DISPOSABLE) ×2 IMPLANT
TOWEL GREEN STERILE FF (TOWEL DISPOSABLE) ×2 IMPLANT
WATER STERILE IRR 1000ML POUR (IV SOLUTION) ×2 IMPLANT

## 2022-10-14 NOTE — Brief Op Note (Signed)
10/14/2022  1:59 PM  PATIENT:  Lisa [name]  87 y.o. female  PRE-OPERATIVE DIAGNOSIS:  Stenosis  POST-OPERATIVE DIAGNOSIS:  Stenosis  PROCEDURE:  Procedure(s): Laminectomy and Foraminotomy - right - Lumbar three-Lumbar four - Lumbar four-Lumbar five (Right)  SURGEON:  Surgeon(s) and Role:    Earnie Larsson, MD - Primary  PHYSICIAN ASSISTANT:   ASSISTANTS: NONE   ANESTHESIA:   general  EBL:  100 mL   BLOOD ADMINISTERED:none  DRAINS: none   LOCAL MEDICATIONS USED:  MARCAINE     SPECIMEN:  No Specimen  DISPOSITION OF SPECIMEN:  PATHOLOGY  COUNTS:  YES  TOURNIQUET:  * No tourniquets in log *  DICTATION: .Dragon Dictation  PLAN OF CARE: Admit for overnight observation  PATIENT DISPOSITION:  PACU - hemodynamically stable.   Delay start of Pharmacological VTE agent (>24hrs) due to surgical blood loss or risk of bleeding: yes

## 2022-10-14 NOTE — Anesthesia Postprocedure Evaluation (Signed)
Anesthesia Post Note  Patient: Lisa [name]  Procedure(s) Performed: Laminectomy and Foraminotomy - right - Lumbar three-Lumbar four - Lumbar four-Lumbar five (Right: Back)     Patient location during evaluation: PACU Anesthesia Type: General Level of consciousness: awake and alert, patient cooperative and oriented Pain management: pain level controlled Vital Signs Assessment: post-procedure vital signs reviewed and stable Respiratory status: spontaneous breathing, nonlabored ventilation and respiratory function stable Cardiovascular status: blood pressure returned to baseline and stable Postop Assessment: no apparent nausea or vomiting Anesthetic complications: no   No notable events documented.  Last Vitals:  Vitals:   10/14/22 1445 10/14/22 1521  BP: (!) 144/54 (!) 142/62  Pulse: 60 68  Resp: 19 20  Temp: (!) 36.3 C (!) 36.4 C  SpO2: 100% 96%    Last Pain:  Vitals:   10/14/22 1521  TempSrc: Oral  PainSc:                  Torian Thoennes,E. Alondra Sahni

## 2022-10-14 NOTE — Transfer of Care (Signed)
Immediate Anesthesia Transfer of Care Note  Patient: Lisa [name]  Procedure(s) Performed: Laminectomy and Foraminotomy - right - Lumbar three-Lumbar four - Lumbar four-Lumbar five (Right: Back)  Patient Location: PACU  Anesthesia Type:General  Level of Consciousness: drowsy and patient cooperative  Airway & Oxygen Therapy: Patient Spontanous Breathing and Patient connected to nasal cannula oxygen  Post-op Assessment: Report given to RN, Post -op Vital signs reviewed and stable, and Patient moving all extremities X 4  Post vital signs: Reviewed and stable  Last Vitals:  Vitals Value Taken Time  BP 144/54   Temp    Pulse 72 10/14/22 1416  Resp 12   SpO2 96 % 10/14/22 1416  Vitals shown include unvalidated device data.  Last Pain:  Vitals:   10/14/22 1018  TempSrc:   PainSc: 1       Patients Stated Pain Goal: 0 ([id number])  Complications: No notable events documented.

## 2022-10-14 NOTE — H&P (Signed)
Lisa [name] is an 87 y.o. female.   Chief Complaint: Right leg pain HPI: 87 year old female with right lower extremity pain numbness and some intermittent weakness failing conservative management.  Workup demonstrates evidence of significant right-sided L3-4 and L4-5 spondylosis with lateral recess stenosis and ongoing compression of her right L4 and L5 nerve roots respectively.  Patient has failed conservative management presents now for lumbar decompressive surgery in hopes improving her symptoms.  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   Pneumonia    Psoriasis     Past Surgical History:  Procedure Laterality Date   APPENDECTOMY     BILATERAL SALPINGOOPHORECTOMY  07/22/1994   CATARACT EXTRACTION     CHOLECYSTECTOMY  07/22/2000   COLONOSCOPY  2003/2009/2015   gamma knife     TOTAL ABDOMINAL HYSTERECTOMY  07/22/1977   FIBROIDS     Family History  Problem Relation Age of Onset   Heart disease Mother    Goiter Mother    Heart disease Father    Heart disease Brother    Goiter Brother    Heart disease Paternal Grandfather    Heart disease Brother    Social History:  reports that she has quit smoking. She has never used smokeless tobacco. She reports that she does not currently use alcohol. She reports that she does not use drugs.  Allergies:  Allergies  Allergen Reactions   Lisinopril Cough   Penicillins     whelp   Doxycycline Rash    Medications Prior to Admission  Medication Sig Dispense Refill   acetaminophen (TYLENOL) 650 MG CR  tablet Take 1,300 mg by mouth every 8 (eight) hours as needed for pain.     amLODipine (NORVASC) 5 MG tablet Take 5 mg by mouth daily.      Cholecalciferol (VITAMIN D) 2000 units tablet Take 2,000 Units by mouth 4 (four) times a week.     fluticasone (FLONASE) 50 MCG/ACT nasal spray Place 1 spray into both nostrils daily.      folic acid (FOLVITE) 1 MG tablet Take 1 mg by mouth daily.      losartan-hydrochlorothiazide (HYZAAR) 100-25 MG tablet TAKE 1 TABLET BY MOUTH EVERY DAY 90 tablet 2   meloxicam (MOBIC) 15 MG tablet Take 15 mg by mouth daily as needed for pain.     methotrexate (RHEUMATREX) 2.5 MG tablet Take 7.5-10 mg by mouth See admin instructions. Take 7.5 mg by mouth on Monday and take 10 mg on Sunday     omeprazole (PRILOSEC) 20 MG capsule Take 20 mg by mouth daily.     phenylephrine-shark liver oil-mineral oil-petrolatum (PREPARATION H) 0.25-14-74.9 % rectal ointment Place 1 Application rectally 2 (two) times daily as needed for hemorrhoids.     Polyethyl Glycol-Propyl Glycol (SYSTANE) 0.4-0.3 % SOLN Place 1 drop into both eyes 3 (three) times daily as needed (dry eyes).     polyethylene glycol (MIRALAX / GLYCOLAX) 17 g packet Take 17 g  by mouth daily as needed for moderate constipation.     zinc oxide (BALMEX) 11.3 % CREA cream Apply 1 Application topically 2 (two) times daily.      No results found for this or any previous visit (from the past 48 hour(s)). No results found.  Pertinent items noted in HPI and remainder of comprehensive ROS otherwise negative.  Blood pressure (!) 155/71, pulse 92, temperature 98.2 F (36.8 C), temperature source Oral, resp. rate 18, height 5' (1.524 m), weight 85.3 kg, SpO2 94 %.  Patient is awake and alert.  She is oriented and appropriate.  Speech is fluent.  Judgment insight are intact.  Cranial nerve function normal by.  Motor examination reveals mild decreased strength in her right quadriceps muscle group otherwise motor strength intact.   Sensory examination with decrease sensation pinprick and light touch in her right L4 and L5 dermatomes.  Deep tendon rhexis hypoactive but symmetric.  Gait antalgic.  Posture mildly flexed.  Social head ears eyes nose throat is unremarkable chest and abdomen are benign.  Extremities are free from injury or deformity. Assessment/Plan Right L3-4 and right L4-5 spondylosis with stenosis and radiculopathy.  Plan right L3-4 and right L4-5 decompressive laminotomies and foraminotomies.  Risks and benefits been explained.  Patient wishes to proceed.  Cooper Render Jayion Schneck 10/14/2022, 12:18 PM

## 2022-10-14 NOTE — Anesthesia Procedure Notes (Signed)
Procedure Name: Intubation Date/Time: 10/14/2022 12:59 PM  Performed by: Annye Asa, MDPre-anesthesia Checklist: Patient identified, Emergency Drugs available, Suction available and Patient being monitored Patient Re-evaluated:Patient Re-evaluated prior to induction Oxygen Delivery Method: Circle system utilized Preoxygenation: Pre-oxygenation with 100% oxygen Induction Type: IV induction Ventilation: Mask ventilation without difficulty Laryngoscope Size: Mac and 3 Grade View: Grade I Tube type: Oral Number of attempts: 1 Airway Equipment and Method: Stylet and Oral airway Placement Confirmation: ETT inserted through vocal cords under direct vision, positive ETCO2 and breath sounds checked- equal and bilateral Secured at: 21 cm Tube secured with: Tape Dental Injury: Teeth and Oropharynx as per pre-operative assessment  Comments: Performed by Ellsworth Lennox NP student with Dr. Glennon Mac

## 2022-10-14 NOTE — Anesthesia Procedure Notes (Signed)
Arterial Line Insertion Start/End3/25/2024 11:18 AM, 10/14/2022 11:24 AM Performed by: Annye Asa, MD, anesthesiologist  Patient location: Pre-op. Preanesthetic checklist: patient identified, IV checked, risks and benefits discussed, surgical consent, monitors and equipment checked, pre-op evaluation, timeout performed and anesthesia consent Lidocaine 1% used for infiltration Right, radial was placed Catheter size: 20 G Hand hygiene performed , maximum sterile barriers used  and Seldinger technique used Allen's test indicative of satisfactory collateral circulation Attempts: 1 Procedure performed using ultrasound guided technique. Ultrasound Notes:anatomy identified, needle tip was noted to be adjacent to the nerve/plexus identified, no ultrasound evidence of intravascular and/or intraneural injection and image(s) printed for medical record Following insertion, dressing applied and Biopatch. Post procedure assessment: normal  Post procedure complications: second provider assisted. Patient tolerated the procedure well with no immediate complications.

## 2022-10-14 NOTE — Op Note (Signed)
Date of procedure: 10/14/2022  Date of dictation: Same  Service: Neurosurgery  Preoperative diagnosis: Right L3-4 and right L4-5 spondylosis with lateral recess stenosis and radiculopathy.  Postoperative diagnosis: Same  Procedure Name: Right L3-4 and right L4-5 decompressive laminotomies and foraminotomies.  Surgeon:Ediberto Sens A.Kalijah Westfall, M.D.  Asst. Surgeon: none  Anesthesia: General  Indication: 87 year old female with right lower extremity radicular pain numbness and weakness failing conservative management and workup demonstrates evidence of multilevel disc degeneration with associated spondylosis and severe right-sided L3-4 and L4-5 lateral recess stenosis with ongoing compression of her right L4 and L5 nerve roots respectively.  Patient presents now for decompressive surgery.  Operative note: After induction of anesthesia, patient positioned prone on the Wilson frame and properly padded.  Lumbar region prepped and draped sterilely.  Incision made overlying L4.  Dissection performed to the right.  Retractor placed.  X-ray taken.  L4-5 level was exposed.  Decompressive laminotomy was then performed using high-speed drill and Kerrison rongeurs to remove the inferior aspect lamina of L4, the medial aspect the L4-5 facet joint, and the superior rim of the L5 lamina.  Ligament flavum elevated and resected.  Foraminotomies complete on the course exiting L4 and L5 nerve roots.  At this point a very thorough decompression had been achieved.  There was no evidence of injury to the thecal sac and nerve roots.  Dissection redirected 1 level cephalad.  Retractor placed.  Laminotomy performed in a similar fashion.  Once again ligament flavum elevated and resected.  Foraminotomies completed along the course exiting L3 and L4 nerve roots.  Disc base examined.  No evidence of soft herniation or ongoing compression.  Wound was then irrigated.  Gelfoam was placed topically.  Wound was then closed in layers with Vicryl  sutures.  Steri-Strips and sterile dressing were applied.  No apparent complications.  Patient tolerated the procedure well and she returns to the recovery room postop.None

## 2022-10-15 ENCOUNTER — Encounter (HOSPITAL_COMMUNITY): Payer: Self-pay | Admitting: Neurosurgery

## 2022-10-15 DIAGNOSIS — I10 Essential (primary) hypertension: Secondary | ICD-10-CM | POA: Diagnosis not present

## 2022-10-15 DIAGNOSIS — M48062 Spinal stenosis, lumbar region with neurogenic claudication: Secondary | ICD-10-CM | POA: Diagnosis not present

## 2022-10-15 DIAGNOSIS — M4726 Other spondylosis with radiculopathy, lumbar region: Secondary | ICD-10-CM | POA: Diagnosis not present

## 2022-10-15 DIAGNOSIS — Z79899 Other long term (current) drug therapy: Secondary | ICD-10-CM | POA: Diagnosis not present

## 2022-10-15 DIAGNOSIS — E039 Hypothyroidism, unspecified: Secondary | ICD-10-CM | POA: Diagnosis not present

## 2022-10-15 DIAGNOSIS — Z87891 Personal history of nicotine dependence: Secondary | ICD-10-CM | POA: Diagnosis not present

## 2022-10-15 MED ORDER — DOCUSATE SODIUM 100 MG PO CAPS
100.0000 mg | ORAL_CAPSULE | Freq: Two times a day (BID) | ORAL | Status: DC
  Administered 2022-10-16: 100 mg via ORAL
  Filled 2022-10-15: qty 1

## 2022-10-15 NOTE — TOC Initial Note (Signed)
Transition of Care [facility]) - Initial/Assessment Note    Patient Details  Name: Lisa [name] MRN: [id number] Date of Birth: 05/08/1936  Transition of Care [facility]) CM/SW Contact:    Geralynn Ochs, LCSW Phone Number: 10/15/2022, 10:29 AM  Clinical Narrative:        Patient from home alone, recommended for SNF placement, preference for [facility]. Patient will qualify for SNF under [facility] waiver. CSW completed referral and sent to [facility] for review.            Expected Discharge Plan: Skilled Nursing Facility Barriers to Discharge: Continued Medical Work up   Patient Goals and CMS Choice Patient states their goals for this hospitalization and ongoing recovery are:: wants to go to rehab CMS Medicare.gov Compare Post Acute Care list provided to:: Patient Choice offered to / list presented to : Patient [place] ownership interest in [facility].provided to:: Patient    Expected Discharge Plan and Services     Post Acute Care Choice: [place] Living arrangements for the past 2 months: Single Family Home                                      Prior Living Arrangements/Services Living arrangements for the past 2 months: Single Family Home Lives with:: Self Patient language and need for interpreter reviewed:: No Do you feel safe going back to the place where you live?: Yes      Need for Family Participation in Patient Care: No (Comment) Care giver support system in place?: No (comment)   Criminal Activity/Legal Involvement Pertinent to Current Situation/Hospitalization: No - Comment as needed  Activities of Daily Living      Permission Sought/Granted Permission sought to share information with : Facility Art therapist granted to share information with : Yes, Verbal Permission Granted     Permission granted to share info w AGENCY: SNF        Emotional Assessment Appearance:: Appears stated  age Attitude/Demeanor/Rapport: Engaged Affect (typically observed): Appropriate Orientation: : Oriented to Self, Oriented to Place, Oriented to  Time, Oriented to Situation Alcohol / Substance Use: Not Applicable Psych Involvement: No (comment)  Admission diagnosis:  Lumbar stenosis with neurogenic claudication [M48.062] Patient Active Problem List   Diagnosis Date Noted   Lumbar stenosis with neurogenic claudication 10/14/2022   Cervical spondylosis 05/14/2022   Gait abnormality 04/30/2022   Advanced nonexudative age-related macular degeneration of both eyes without subfoveal involvement 04/04/2022   Peripheral neuropathy 12/18/2021   Anxiety 09/27/2020   Aortic valve disorder 09/27/2020   Arthritis 09/27/2020   Hyperthyroidism 09/27/2020   Thyromegaly 09/27/2020   Body mass index (BMI) 36.0-36.9, adult 09/27/2020   History of radiation exposure 09/27/2020   Idiopathic kyphoscoliosis 09/27/2020   Macular degeneration 09/27/2020   Other seasonal allergic rhinitis 09/27/2020   Unsteady gait 09/27/2020   Arthritis, multiple joint involvement 08/21/2020   Atrophy of vagina 08/21/2020   Gastro-esophageal reflux disease without esophagitis 08/21/2020   Generalized anxiety disorder 08/21/2020   Mixed hyperlipidemia 08/21/2020   Osteoporosis 08/21/2020   Perennial allergic rhinitis with seasonal variation 08/21/2020   Psoriatic arthritis ([place]) 08/21/2020   Vitamin D deficiency 08/21/2020   Body mass index (BMI) 35.0-35.9, adult 05/08/2020   Lumbago with sciatica, right side 05/08/2020   Anterolisthesis of lumbar spine 05/08/2020   Other chronic pain 05/08/2020   Intermediate stage nonexudative age-related macular degeneration of both eyes 04/03/2020  Posterior vitreous detachment of right eye 04/03/2020   Posterior vitreous detachment of left eye 04/03/2020   Chronic secretory otitis media of right ear 09/23/2019   Sensorineural hearing loss (SNHL) of left ear with restricted  hearing of right ear 06/09/2019   Multinodular goiter 11/07/2016   Neck pain 01/16/2015   Aortic stenosis 01/06/2014   Essential hypertension, benign 01/06/2014   Family history of coronary artery disease 01/06/2014   Diabetes ([place]) 01/06/2014   Chronic low back pain 07/05/2013   Displacement of lumbar intervertebral disc without myelopathy 07/05/2013   Lumbosacral radiculitis 07/05/2013   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

## 2022-10-15 NOTE — Progress Notes (Signed)
Physical Therapy Treatment Patient Details Name: Lisa [name] MRN: [id number] DOB: 23-Oct-1935 Today's Date: 10/15/2022   History of Present Illness Pt is a 87 y/o female who presents s/p right L3-4 and right L4-5 decompressive laminotomies and foraminotomies. PMH significant for macular dengeneration, arthritic hands    PT Comments    Pt progressing with post-op mobility. She was able to demonstrate transfers and ambulation with gross min guard assist to min assist and RW for support. Pt was educated on precautions, positioning recommendations, and appropriate activity progression. Will continue to follow.      Recommendations for follow up therapy are one component of a multi-disciplinary discharge planning process, led by the attending physician.  Recommendations may be updated based on patient status, additional functional criteria and insurance authorization.  Follow Up Recommendations  Can patient physically be transported by private vehicle: Yes    Assistance Recommended at Discharge Frequent or constant Supervision/Assistance  Patient can return home with the following A little help with walking and/or transfers;A little help with bathing/dressing/bathroom;Assistance with cooking/housework;Assist for transportation;Help with stairs or ramp for entrance   Equipment Recommendations  Other (comment) (TBD by next venue of care)    Recommendations for Other Services       Precautions / Restrictions Precautions Precautions: Back;Fall Precaution Booklet Issued: Yes (comment) Precaution Comments: Reviewed handout and pt was cued for precautions during functional mobility. Required Braces or Orthoses:  (no brace required per orders) Restrictions Weight Bearing Restrictions: No     Mobility  Bed Mobility Overal bed mobility: Needs Assistance Bed Mobility: Rolling, Sit to Sidelying Rolling: Min guard Sidelying to sit: Min guard     Sit to sidelying: Min assist General bed  mobility comments: Assist for LE elevation back up into bed at end of session. HOB flat but use of rails required.    Transfers Overall transfer level: Needs assistance Equipment used: Rolling walker (2 wheels) Transfers: Sit to/from Stand Sit to Stand: Min assist           General transfer comment: VC's for hand placement on seated surface for safety. Increased time to prepare to stand with difficulty initiating.    Ambulation/Gait Ambulation/Gait assistance: Min guard Gait Distance (Feet): 100 Feet Assistive device: Rolling walker (2 wheels) Gait Pattern/deviations: Step-through pattern, Decreased stride length, Trunk flexed, Narrow base of support, Antalgic Gait velocity: Decreased Gait velocity interpretation: <1.31 ft/sec, indicative of household ambulator   General Gait Details: Slow and guarded but without overt LOB. Pt able to tolerate increased ambulation distance this session. VC's throughout for improved posture, closer walker proximity, and forward gaze.   Stairs             Wheelchair Mobility    Modified Rankin (Stroke Patients Only)       Balance Overall balance assessment: Mild deficits observed, not formally tested                                          Cognition Arousal/Alertness: Awake/alert Behavior During Therapy: WFL for tasks assessed/performed Overall Cognitive Status: Within Functional Limits for tasks assessed                                          Exercises      General Comments  Pertinent Vitals/Pain Pain Assessment Pain Assessment: Faces Faces Pain Scale: Hurts little more Pain Location: incisional Pain Descriptors / Indicators: Aching, Sore Pain Intervention(s): Limited activity within patient's tolerance, Monitored during session, Repositioned    Home Living Family/patient expects to be discharged to:: Private residence Living Arrangements: Alone Available Help at  Discharge: Friend(s);Available PRN/intermittently Type of Home: House       Alternate Level Stairs-Number of Steps: flight Home Layout: Two level;Bed/bath upstairs;1/2 bath on main level        Prior Function            PT Goals (current goals can now be found in the care plan section) Acute Rehab PT Goals Patient Stated Goal: Be able to return to her home alone PT Goal Formulation: With patient Time For Goal Achievement: 10/22/22 Potential to Achieve Goals: Good Progress towards PT goals: Progressing toward goals    Frequency    Min 5X/week      PT Plan Current plan remains appropriate    Co-evaluation              AM-PAC PT "6 Clicks" Mobility   Outcome Measure  Help needed turning from your back to your side while in a flat bed without using bedrails?: A Little Help needed moving from lying on your back to sitting on the side of a flat bed without using bedrails?: A Little Help needed moving to and from a bed to a chair (including a wheelchair)?: A Little Help needed standing up from a chair using your arms (e.g., wheelchair or bedside chair)?: A Little Help needed to walk in hospital room?: A Little Help needed climbing 3-5 steps with a railing? : Total 6 Click Score: 16    End of Session Equipment Utilized During Treatment: Gait belt Activity Tolerance: Patient limited by fatigue Patient left: in bed;with call bell/phone within reach;with family/visitor present Nurse Communication: Mobility status PT Visit Diagnosis: Unsteadiness on feet (R26.81);Pain Pain - part of body:  (back)     Time: [id number] PT Time Calculation (min) (ACUTE ONLY): 22 min  Charges:  $Gait Training: 8-22 mins                     Rolinda Roan, PT, DPT Acute Rehabilitation Services Secure Chat Preferred Office: [phone]    Thelma Comp 10/15/2022, 3:37 PM

## 2022-10-15 NOTE — Evaluation (Signed)
Occupational Therapy Evaluation Patient Details Name: Lisa [name] MRN: [id number] DOB: [birth year] Today's Date: 10/15/2022   History of Present Illness Pt is a 87 yo female s/p right L3-4 and right L4-5 decompressive laminotomies and foraminotomies. PHMX: macular dengeneration, arthritic hands   Clinical Impression   This 87 yo female admitted and underwent above presents to acute OT with PLOF of being totally independent to Mod I with all basic ADLs and IADLs. She currently needs min-min guard A for mobility and setup/S-mod A for basic ADLs. She will continue to benefit from acute OT with follow up at SNF.      Recommendations for follow up therapy are one component of a multi-disciplinary discharge planning process, led by the attending physician.  Recommendations may be updated based on patient status, additional functional criteria and insurance authorization.   Assistance Recommended at Discharge Frequent or constant Supervision/Assistance  Patient can return home with the following A little help with bathing/dressing/bathroom;A little help with walking and/or transfers;Assistance with cooking/housework;Help with stairs or ramp for entrance;Assist for transportation    Functional Status Assessment  Patient has had a recent decline in their functional status and demonstrates the ability to make significant improvements in function in a reasonable and predictable amount of time.  Equipment Recommendations  None recommended by OT       Precautions / Restrictions Precautions Precautions: Back Precaution Booklet Issued: Yes (comment) Required Braces or Orthoses:  (no brace required per orders) Restrictions Weight Bearing Restrictions: No      Mobility Bed Mobility               General bed mobility comments: up with PT when I entered room    Transfers Overall transfer level: Needs assistance Equipment used: Rolling walker (2 wheels) Transfers: Sit to/from Stand Sit to  Stand: Min guard                  Balance Overall balance assessment: Mild deficits observed, not formally tested                                         ADL either performed or assessed with clinical judgement   ADL Overall ADL's : Needs assistance/impaired Eating/Feeding: Independent;Sitting   Grooming: Min guard;Standing;Wash/dry hands   Upper Body Bathing: Set up;Supervision/ safety;Sitting   Lower Body Bathing: Moderate assistance Lower Body Bathing Details (indicate cue type and reason): min guard A sit<>stand Upper Body Dressing : Set up;Supervision/safety;Sitting   Lower Body Dressing: Moderate assistance;With adaptive equipment Lower Body Dressing Details (indicate cue type and reason): min guard A sit<>stand Toilet Transfer: Minimal assistance;Ambulation;Rolling walker (2 wheels);Grab bars;BSC/3in1 (over toilet)   Toileting- Clothing Manipulation and Hygiene: Minimal assistance Toileting - Clothing Manipulation Details (indicate cue type and reason): min guard A sit<>stand             Vision Baseline Vision/History: 6 Macular Degeneration Ability to See in Adequate Light: 0 Adequate (with glasses) Patient Visual Report: No change from baseline              Pertinent Vitals/Pain Pain Assessment Pain Assessment: 0-10 Pain Score: 5  Pain Location: incisional Pain Descriptors / Indicators: Aching, Sore Pain Intervention(s): Limited activity within patient's tolerance, Monitored during session, Repositioned     Hand Dominance Right   Extremity/Trunk Assessment Upper Extremity Assessment Upper Extremity Assessment: Overall WFL for tasks assessed  Communication Communication Communication: No difficulties   Cognition Arousal/Alertness: Awake/alert Behavior During Therapy: WFL for tasks assessed/performed Overall Cognitive Status: Within Functional Limits for tasks assessed                                                   Home Living Family/patient expects to be discharged to:: Private residence Living Arrangements: Alone Available Help at Discharge: Friend(s);Available PRN/intermittently Type of Home: House       Home Layout: Two level;Bed/bath upstairs;1/2 bath on main level     Bathroom Shower/Tub: Tub/shower unit   Bathroom Toilet: Handicapped height                Prior Functioning/Environment Prior Level of Function : Independent/Modified Independent                        OT Problem List: Decreased strength;Decreased range of motion;Impaired balance (sitting and/or standing);Impaired vision/perception;Decreased knowledge of use of DME or AE;Decreased knowledge of precautions;Impaired UE functional use;Obesity;Pain      OT Treatment/Interventions: Self-care/ADL training;DME and/or AE instruction;Patient/family education;Balance training    OT Goals(Current goals can be found in the care plan section) Acute Rehab OT Goals Patient Stated Goal: to be able to do more for myself before I go home alone OT Goal Formulation: With patient Time For Goal Achievement: 10/29/22 Potential to Achieve Goals: Good  OT Frequency: Min 2X/week       AM-PAC OT "6 Clicks" Daily Activity     Outcome Measure Help from another person eating meals?: None Help from another person taking care of personal grooming?: A Little Help from another person toileting, which includes using toliet, bedpan, or urinal?: A Little Help from another person bathing (including washing, rinsing, drying)?: A Lot Help from another person to put on and taking off regular upper body clothing?: A Little Help from another person to put on and taking off regular lower body clothing?: A Lot 6 Click Score: 17   End of Session Equipment Utilized During Treatment: Gait belt;Rolling walker (2 wheels) Nurse Communication: Mobility status (need for SNF)  Activity Tolerance: Patient tolerated  treatment well Patient left: in chair;with call bell/phone within reach  OT Visit Diagnosis: Unsteadiness on feet (R26.81);Other abnormalities of gait and mobility (R26.89);Muscle weakness (generalized) (M62.81);Pain;Low vision, both eyes (H54.2) Pain - part of body:  (incisional)                Time: [id number] OT Time Calculation (min): 32 min Charges:  OT General Charges $OT Visit: 1 Visit OT Evaluation $OT Eval Moderate Complexity: 1 Mod OT Treatments $Self Care/Home Management : 8-22 mins  [place] Office [phone]    Almon Register 10/15/2022, 9:37 AM

## 2022-10-15 NOTE — Progress Notes (Signed)
Postop day 1.  Back pain well-controlled.  No radicular pain.  No numbness or weakness.  Patient standing and ambulating with assistance.  Vital signs are stable.  Awake and alert.  Oriented and appropriate.  Motor and sensory function intact to direct testing.  Wound clean and dry.  Abdomen soft.  Elderly woman status post two-level lumbar decompressive surgery.  Patient lives alone and does not feel that she will thrive alone as she recuperates from surgery.  Patient requests skilled nursing facility placement.  We will arrange.

## 2022-10-15 NOTE — NC FL2 (Addendum)
[place] LEVEL OF CARE FORM     IDENTIFICATION  Patient Name: Lisa [name] Birthdate: 03/18/1936 Sex: female Admission Date (Current Location): 10/14/2022  [facility] and [place] Number:  Herbalist and Address:  [facility] [place], [place] [address] [place], [place] [address]      Provider Number: [id number]  Attending Physician Name and Address:  Earnie Larsson, MD  Relative Name and Phone Number:       Current Level of Care: Hospital Recommended Level of Care: [place] Prior Approval Number:    Date Approved/Denied:   PASRR Number: [id number]  Discharge Plan: SNF    Current Diagnoses: Patient Active Problem List   Diagnosis Date Noted   Lumbar stenosis with neurogenic claudication 10/14/2022   Cervical spondylosis 05/14/2022   Gait abnormality 04/30/2022   Advanced nonexudative age-related macular degeneration of both eyes without subfoveal involvement 04/04/2022   Peripheral neuropathy 12/18/2021   Anxiety 09/27/2020   Aortic valve disorder 09/27/2020   Arthritis 09/27/2020   Hyperthyroidism 09/27/2020   Thyromegaly 09/27/2020   Body mass index (BMI) 36.0-36.9, adult 09/27/2020   History of radiation exposure 09/27/2020   Idiopathic kyphoscoliosis 09/27/2020   Macular degeneration 09/27/2020   Other seasonal allergic rhinitis 09/27/2020   Unsteady gait 09/27/2020   Arthritis, multiple joint involvement 08/21/2020   Atrophy of vagina 08/21/2020   Gastro-esophageal reflux disease without esophagitis 08/21/2020   Generalized anxiety disorder 08/21/2020   Mixed hyperlipidemia 08/21/2020   Osteoporosis 08/21/2020   Perennial allergic rhinitis with seasonal variation 08/21/2020   Psoriatic arthritis ([place]) 08/21/2020   Vitamin D deficiency 08/21/2020   Body mass index (BMI) 35.0-35.9, adult 05/08/2020   Lumbago with sciatica, right side 05/08/2020   Anterolisthesis of lumbar spine 05/08/2020   Other chronic  pain 05/08/2020   Intermediate stage nonexudative age-related macular degeneration of both eyes 04/03/2020   Posterior vitreous detachment of right eye 04/03/2020   Posterior vitreous detachment of left eye 04/03/2020   Chronic secretory otitis media of right ear 09/23/2019   Sensorineural hearing loss (SNHL) of left ear with restricted hearing of right ear 06/09/2019   Multinodular goiter 11/07/2016   Neck pain 01/16/2015   Aortic stenosis 01/06/2014   Essential hypertension, benign 01/06/2014   Family history of coronary artery disease 01/06/2014   Diabetes ([place]) 01/06/2014   Chronic low back pain 07/05/2013   Displacement of lumbar intervertebral disc without myelopathy 07/05/2013   Lumbosacral radiculitis 07/05/2013   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    Orientation RESPIRATION BLADDER Height & Weight     Self, Time, Situation, Place  Normal Continent Weight: 188 lb (85.3 kg) Height:  5' (152.4 cm)  BEHAVIORAL SYMPTOMS/MOOD NEUROLOGICAL BOWEL NUTRITION STATUS      Continent Diet (regular)  AMBULATORY STATUS COMMUNICATION OF NEEDS Skin   Limited Assist Verbally Surgical wounds (closed back, honeycomb dressing: change PRN)                       Personal Care Assistance Level of Assistance  Bathing, Feeding, Dressing Bathing Assistance: Limited assistance Feeding assistance: Independent Dressing Assistance: Limited assistance     Functional Limitations Info  Hearing   Hearing Info: Impaired (deaf right ear, hard of hearing left ear)      [place]  PT (By licensed PT), OT (By licensed OT)     PT Frequency: 5x/wk OT Frequency: 5x/wk  Contractures Contractures Info: Not present    Additional Factors Info  Code Status, Allergies Code Status Info: Full Allergies Info: Lisinopril, Penicillins, Doxycycline           Current Medications (10/15/2022):  This is the current hospital active medication list Current  Facility-Administered Medications  Medication Dose Route Frequency Provider Last Rate Last Admin   0.9 %  sodium chloride infusion  250 mL Intravenous Continuous Earnie Larsson, MD 1 mL/hr at 10/14/22 1643 250 mL at 10/14/22 1643   acetaminophen (TYLENOL) tablet 650 mg  650 mg Oral Q4H PRN Earnie Larsson, MD       Or   acetaminophen (TYLENOL) suppository 650 mg  650 mg Rectal Q4H PRN Earnie Larsson, MD       amLODipine (NORVASC) tablet 5 mg  5 mg Oral Daily Earnie Larsson, MD   5 mg at 10/15/22 [id number]   cholecalciferol (VITAMIN D3) 25 MCG (1000 UNIT) tablet 2,000 Units  2,000 Units Oral Once per day on Sun Tue Thu Sat Earnie Larsson, MD   2,000 Units at 10/15/22 0805   cyclobenzaprine (FLEXERIL) tablet 5-10 mg  5-10 mg Oral TID PRN Earnie Larsson, MD       fluticasone Asencion Islam) 50 MCG/ACT nasal spray 1 spray  1 spray Each Nare Daily Earnie Larsson, MD   1 spray at [id number]   folic acid (FOLVITE) tablet 1 mg  1 mg Oral Daily Earnie Larsson, MD   1 mg at 10/15/22 0909   losartan (COZAAR) tablet 100 mg  100 mg Oral Daily Madueme, Elvira C, RPH   100 mg at 10/15/22 [id number]   And   hydrochlorothiazide (HYDRODIURIL) tablet 25 mg  25 mg Oral Daily Madueme, Elvira C, RPH   25 mg at 10/15/22 0909   HYDROcodone-acetaminophen ([place]) 10-325 MG per tablet 1-2 tablet  1-2 tablet Oral Q4H PRN Earnie Larsson, MD   1 tablet at 10/15/22 0753   HYDROcodone-acetaminophen (NORCO/VICODIN) 5-325 MG per tablet 1 tablet  1 tablet Oral Q4H PRN Earnie Larsson, MD   1 tablet at 10/15/22 0150   HYDROmorphone (DILAUDID) injection 1 mg  1 mg Intravenous Q2H PRN Earnie Larsson, MD       ketorolac (TORADOL) 15 MG/ML injection 7.5 mg  7.5 mg Intravenous Q6H Earnie Larsson, MD   7.5 mg at 10/15/22 [id number]   menthol-cetylpyridinium (CEPACOL) lozenge 3 mg  1 lozenge Oral PRN Earnie Larsson, MD       Or   phenol (CHLORASEPTIC) mouth spray 1 spray  1 spray Mouth/Throat PRN Earnie Larsson, MD       ondansetron [facility]) tablet 4 mg  4 mg Oral Q6H PRN Earnie Larsson, MD       Or    ondansetron (ZOFRAN) injection 4 mg  4 mg Intravenous Q6H PRN Earnie Larsson, MD       pantoprazole (PROTONIX) EC tablet 40 mg  40 mg Oral Daily Earnie Larsson, MD   40 mg at 10/15/22 [id number]   polyvinyl alcohol (LIQUIFILM TEARS) 1.4 % ophthalmic solution 1 drop  1 drop Both Eyes TID PRN Earnie Larsson, MD       sodium chloride flush (NS) 0.9 % injection 3 mL  3 mL Intravenous Q12H Earnie Larsson, MD   3 mL at 10/15/22 0915   sodium chloride flush (NS) 0.9 % injection 3 mL  3 mL Intravenous PRN Earnie Larsson, MD         Discharge Medications: Please see discharge summary for a list of discharge medications.  Relevant Imaging Results:  Relevant Lab Results:   Additional Information SS#: [id number]  Geralynn Ochs, LCSW

## 2022-10-15 NOTE — Evaluation (Signed)
Physical Therapy Evaluation  Patient Details Name: Lisa [name] MRN: [id number] DOB: 06-28-1936 Today's Date: 10/15/2022  History of Present Illness  Pt is a 87 y/o female who presents s/p right L3-4 and right L4-5 decompressive laminotomies and foraminotomies. PMH significant for macular dengeneration, arthritic hands   Clinical Impression  Pt admitted with above diagnosis. At the time of PT eval, pt was able to demonstrate transfers and ambulation with gross min guard assist to min assist and RW for support. Recommend post-acute therapy <3 hours a day to maximize functional independence and safety prior to return home alone. Pt currently with functional limitations due to the deficits listed below (see PT Problem List). Pt will benefit from skilled PT to increase their independence and safety with mobility to allow discharge to the venue listed below.         Recommendations for follow up therapy are one component of a multi-disciplinary discharge planning process, led by the attending physician.  Recommendations may be updated based on patient status, additional functional criteria and insurance authorization.  Follow Up Recommendations Can patient physically be transported by private vehicle: Yes     Assistance Recommended at Discharge Frequent or constant Supervision/Assistance  Patient can return home with the following  A little help with walking and/or transfers;A little help with bathing/dressing/bathroom;Assistance with cooking/housework;Assist for transportation;Help with stairs or ramp for entrance    Equipment Recommendations Other (comment) (TBD by next venue of care)  Recommendations for Other Services       Functional Status Assessment Patient has had a recent decline in their functional status and demonstrates the ability to make significant improvements in function in a reasonable and predictable amount of time.     Precautions / Restrictions Precautions Precautions:  Back;Fall Precaution Booklet Issued: Yes (comment) Required Braces or Orthoses:  (no brace required per orders) Restrictions Weight Bearing Restrictions: No      Mobility  Bed Mobility Overal bed mobility: Needs Assistance Bed Mobility: Rolling, Sidelying to Sit Rolling: Min guard Sidelying to sit: Min guard       General bed mobility comments: Increased time and effort to transition to EOB. VC's throughout for log roll technique and hands on guarding throughout for safety and to guide pt through log roll.    Transfers Overall transfer level: Needs assistance Equipment used: Rolling walker (2 wheels) Transfers: Sit to/from Stand Sit to Stand: Min assist           General transfer comment: VC's for hand placement on seated surface for safety. Increased time to prepare to stand with difficulty initiating.    Ambulation/Gait Ambulation/Gait assistance: Min guard Gait Distance (Feet): 60 Feet Assistive device: Rolling walker (2 wheels) Gait Pattern/deviations: Step-through pattern, Decreased stride length, Trunk flexed, Narrow base of support, Antalgic Gait velocity: Decreased Gait velocity interpretation: <1.31 ft/sec, indicative of household ambulator   General Gait Details: Slow and uncoordinated. Pt initially with very short, erratic steps. Able to improve step/stride length with cues but effortful and with pause before pt advanced next LE.  Stairs            Wheelchair Mobility    Modified Rankin (Stroke Patients Only)       Balance Overall balance assessment: Mild deficits observed, not formally tested  Pertinent Vitals/Pain Pain Assessment Pain Assessment: Faces Faces Pain Scale: Hurts little more Pain Location: incisional Pain Descriptors / Indicators: Aching, Sore Pain Intervention(s): Limited activity within patient's tolerance, Monitored during session, Repositioned    Home Living  Family/patient expects to be discharged to:: Private residence Living Arrangements: Alone Available Help at Discharge: Friend(s);Available PRN/intermittently Type of Home: House       Alternate Level Stairs-Number of Steps: flight Home Layout: Two level;Bed/bath upstairs;1/2 bath on main level        Prior Function Prior Level of Function : Independent/Modified Independent                     Hand Dominance   Dominant Hand: Right    Extremity/Trunk Assessment   Upper Extremity Assessment Upper Extremity Assessment: Defer to OT evaluation    Lower Extremity Assessment Lower Extremity Assessment: Generalized weakness    Cervical / Trunk Assessment Cervical / Trunk Assessment: Back Surgery  Communication   Communication: No difficulties  Cognition Arousal/Alertness: Awake/alert Behavior During Therapy: WFL for tasks assessed/performed Overall Cognitive Status: Within Functional Limits for tasks assessed                                          General Comments      Exercises     Assessment/Plan    PT Assessment Patient needs continued PT services  PT Problem List Decreased strength;Decreased activity tolerance;Decreased balance;Decreased mobility;Decreased knowledge of use of DME;Decreased safety awareness;Decreased knowledge of precautions;Pain       PT Treatment Interventions DME instruction;Gait training;Stair training;Functional mobility training;Therapeutic activities;Therapeutic exercise;Balance training;Patient/family education    PT Goals (Current goals can be found in the Care Plan section)  Acute Rehab PT Goals Patient Stated Goal: Be able to return to her home alone PT Goal Formulation: With patient Time For Goal Achievement: 10/22/22 Potential to Achieve Goals: Good    Frequency Min 5X/week     Co-evaluation               AM-PAC PT "6 Clicks" Mobility  Outcome Measure Help needed turning from your back to  your side while in a flat bed without using bedrails?: A Little Help needed moving from lying on your back to sitting on the side of a flat bed without using bedrails?: A Little Help needed moving to and from a bed to a chair (including a wheelchair)?: A Little Help needed standing up from a chair using your arms (e.g., wheelchair or bedside chair)?: A Little Help needed to walk in hospital room?: A Little Help needed climbing 3-5 steps with a railing? : Total 6 Click Score: 16    End of Session Equipment Utilized During Treatment: Gait belt Activity Tolerance: Patient limited by fatigue Patient left: Other (comment) (Pt left in bathroom with OT present) Nurse Communication: Mobility status PT Visit Diagnosis: Unsteadiness on feet (R26.81);Pain Pain - part of body:  (back)    Time: [id number] PT Time Calculation (min) (ACUTE ONLY): 35 min   Charges:   PT Evaluation $PT Eval Low Complexity: 1 Low PT Treatments $Gait Training: 8-22 mins        Lisa [name], PT, DPT Acute Rehabilitation Services Secure Chat Preferred Office: [phone]   Thelma Comp 10/15/2022, 10:32 AM

## 2022-10-16 DIAGNOSIS — M4726 Other spondylosis with radiculopathy, lumbar region: Secondary | ICD-10-CM | POA: Diagnosis not present

## 2022-10-16 DIAGNOSIS — Z87891 Personal history of nicotine dependence: Secondary | ICD-10-CM | POA: Diagnosis not present

## 2022-10-16 DIAGNOSIS — M1990 Unspecified osteoarthritis, unspecified site: Secondary | ICD-10-CM | POA: Diagnosis not present

## 2022-10-16 DIAGNOSIS — M6281 Muscle weakness (generalized): Secondary | ICD-10-CM | POA: Diagnosis not present

## 2022-10-16 DIAGNOSIS — F411 Generalized anxiety disorder: Secondary | ICD-10-CM | POA: Diagnosis not present

## 2022-10-16 DIAGNOSIS — H3530 Unspecified macular degeneration: Secondary | ICD-10-CM | POA: Diagnosis not present

## 2022-10-16 DIAGNOSIS — M419 Scoliosis, unspecified: Secondary | ICD-10-CM | POA: Diagnosis not present

## 2022-10-16 DIAGNOSIS — K219 Gastro-esophageal reflux disease without esophagitis: Secondary | ICD-10-CM | POA: Diagnosis not present

## 2022-10-16 DIAGNOSIS — Z79899 Other long term (current) drug therapy: Secondary | ICD-10-CM | POA: Diagnosis not present

## 2022-10-16 DIAGNOSIS — M48062 Spinal stenosis, lumbar region with neurogenic claudication: Secondary | ICD-10-CM | POA: Diagnosis not present

## 2022-10-16 DIAGNOSIS — M47812 Spondylosis without myelopathy or radiculopathy, cervical region: Secondary | ICD-10-CM | POA: Diagnosis not present

## 2022-10-16 DIAGNOSIS — H6531 Chronic mucoid otitis media, right ear: Secondary | ICD-10-CM | POA: Diagnosis not present

## 2022-10-16 DIAGNOSIS — G629 Polyneuropathy, unspecified: Secondary | ICD-10-CM | POA: Diagnosis not present

## 2022-10-16 DIAGNOSIS — M810 Age-related osteoporosis without current pathological fracture: Secondary | ICD-10-CM | POA: Diagnosis not present

## 2022-10-16 DIAGNOSIS — E782 Mixed hyperlipidemia: Secondary | ICD-10-CM | POA: Diagnosis not present

## 2022-10-16 DIAGNOSIS — F419 Anxiety disorder, unspecified: Secondary | ICD-10-CM | POA: Diagnosis not present

## 2022-10-16 DIAGNOSIS — R2681 Unsteadiness on feet: Secondary | ICD-10-CM | POA: Diagnosis not present

## 2022-10-16 DIAGNOSIS — E559 Vitamin D deficiency, unspecified: Secondary | ICD-10-CM | POA: Diagnosis not present

## 2022-10-16 DIAGNOSIS — R2689 Other abnormalities of gait and mobility: Secondary | ICD-10-CM | POA: Diagnosis not present

## 2022-10-16 DIAGNOSIS — E039 Hypothyroidism, unspecified: Secondary | ICD-10-CM | POA: Diagnosis not present

## 2022-10-16 DIAGNOSIS — I10 Essential (primary) hypertension: Secondary | ICD-10-CM | POA: Diagnosis not present

## 2022-10-16 DIAGNOSIS — E119 Type 2 diabetes mellitus without complications: Secondary | ICD-10-CM | POA: Diagnosis not present

## 2022-10-16 DIAGNOSIS — I359 Nonrheumatic aortic valve disorder, unspecified: Secondary | ICD-10-CM | POA: Diagnosis not present

## 2022-10-16 MED ORDER — HYDROCODONE-ACETAMINOPHEN 5-325 MG PO TABS: 1.0000 | ORAL_TABLET | ORAL | 0 refills | Status: AC | PRN

## 2022-10-16 NOTE — TOC Transition Note (Signed)
Transition of Care [facility]) - CM/SW Discharge Note   Patient Details  Name: Lisa [name] MRN: [id number] Date of Birth: 09/24/1935  Transition of Care [facility]) CM/SW Contact:  Geralynn Ochs, LCSW Phone Number: 10/16/2022, 10:44 AM   Clinical Narrative:   Patient stable for discharge. CSW sent discharge information to [facility]. CSW confirmed bed availability. CSW notified patient and nephew at bedside, agreeable to transport. Family will provide transportation.   Nurse to call report to [phone], Room 503B.    Final next level of care: Skilled Nursing Facility Barriers to Discharge: Barriers Resolved   Patient Goals and CMS Choice CMS Medicare.gov Compare Post Acute Care list provided to:: Patient Choice offered to / list presented to : Patient  Discharge Placement                Patient chooses bed at: [facility] Patient to be transferred to facility by: [facility] Name of family member notified: Self Patient and family notified of of transfer: 10/16/22  Discharge Plan and Services Additional resources added to the After Visit Summary for       Post Acute Care Choice: [facility]                               Social Determinants of Health (SDOH) Interventions SDOH Screenings   Tobacco Use: Medium Risk (10/15/2022)     Readmission Risk Interventions     No data to display

## 2022-10-16 NOTE — Discharge Instructions (Addendum)
Wound Care Keep incision covered and dry for three days.  Do not put any creams, lotions, or ointments on incision. Leave steri-strips on back.  They will fall off by themselves. Activity Walk each and every day, increasing distance each day. No lifting greater than 5 lbs.  Avoid excessive back bending..   Diet Resume your normal diet.   Call Your Doctor If Any of These Occur Redness, drainage, or swelling at the wound.  Temperature greater than 101 degrees. Severe pain not relieved by pain medication. Incision starts to come apart. Follow Up Appt Call [phone]) for problems.

## 2022-10-16 NOTE — Progress Notes (Signed)
Patient being discharged to [facility].  Patient to be transported by [employer]  IV removed with the catheter intact.  Discharge instructions and prescription information placed in the packet for discharge.  Report called to receiving Nurse at [facility].

## 2022-10-16 NOTE — Discharge Summary (Signed)
Physician Discharge Summary  Patient ID: Lisa [name] MRN: [id number] DOB/AGE: October 19, 1935 87 y.o.  Admit date: 10/14/2022 Discharge date: 10/16/2022  Admission Diagnoses:  Discharge Diagnoses:  Principal Problem:   Lumbar stenosis with neurogenic claudication   Discharged Condition: good  Hospital Course: Patient admitted to the hospital where she underwent uncomplicated two-level lumbar decompressive surgery.  Postoperatively doing well.  Standing ambulating and voiding with minimal assistance.  Preoperative radicular pain much improved.  Patient lives alone and needs extra assistance while she convalescence.  Plan is for discharge to a skilled nursing facility.  Consults:   Significant Diagnostic Studies:   Treatments:   Discharge Exam: Blood pressure (!) 151/53, pulse 66, temperature 97.7 F (36.5 C), temperature source Oral, resp. rate 16, height 5' (1.524 m), weight 85.3 kg, SpO2 93 %. Awake and alert.  Oriented and appropriate.  Motor and sensory function intact.  Wound clean and dry.  Chest and abdomen benign.  Disposition: Discharge disposition: 03-Skilled Nursing Facility        Allergies as of 10/16/2022       Reactions   Lisinopril Cough   Penicillins    whelp   Doxycycline Rash        Medication List     TAKE these medications    acetaminophen 650 MG CR tablet Commonly known as: TYLENOL Take 1,300 mg by mouth every 8 (eight) hours as needed for pain.   amLODipine 5 MG tablet Commonly known as: NORVASC Take 5 mg by mouth daily.   fluticasone 50 MCG/ACT nasal spray Commonly known as: FLONASE Place 1 spray into both nostrils daily.   folic acid 1 MG tablet Commonly known as: FOLVITE Take 1 mg by mouth daily.   HYDROcodone-acetaminophen 5-325 MG tablet Commonly known as: NORCO/VICODIN Take 1 tablet by mouth every 4 (four) hours as needed for moderate pain ((score 4 to 6)).   losartan-hydrochlorothiazide 100-25 MG tablet Commonly known as:  HYZAAR TAKE 1 TABLET BY MOUTH EVERY DAY   meloxicam 15 MG tablet Commonly known as: MOBIC Take 15 mg by mouth daily as needed for pain.   methotrexate 2.5 MG tablet Commonly known as: RHEUMATREX Take 7.5-10 mg by mouth See admin instructions. Take 7.5 mg by mouth on Monday and take 10 mg on Sunday   omeprazole 20 MG capsule Commonly known as: PRILOSEC Take 20 mg by mouth daily.   phenylephrine-shark liver oil-mineral oil-petrolatum 0.25-14-74.9 % rectal ointment Commonly known as: PREPARATION H Place 1 Application rectally 2 (two) times daily as needed for hemorrhoids.   polyethylene glycol 17 g packet Commonly known as: MIRALAX / GLYCOLAX Take 17 g by mouth daily as needed for moderate constipation.   Systane 0.4-0.3 % Soln Generic drug: Polyethyl Glycol-Propyl Glycol Place 1 drop into both eyes 3 (three) times daily as needed (dry eyes).   Vitamin D 50 MCG (2000 UT) tablet Take 2,000 Units by mouth 4 (four) times a week.   zinc oxide 11.3 % Crea cream Commonly known as: BALMEX Apply 1 Application topically 2 (two) times daily.         SignedCharlie Pitter 10/16/2022, 9:47 AM

## 2022-10-16 NOTE — Progress Notes (Signed)
Physical Therapy Treatment Patient Details Name: NASTEHO URAM MRN: [id number] DOB: 12-Sep-1935 Today's Date: 10/16/2022   History of Present Illness Pt is a 87 y/o female who presents s/p right L3-4 and right L4-5 decompressive laminotomies and foraminotomies. PMH significant for macular dengeneration, arthritic hands    PT Comments    Pt progressing towards physical therapy goals. Was able to perform transfers and ambulation with gross min guard assist to min assist with RW for support. Pt with decreased recall of precautions, and poor maintenance of precautions during functional mobility even when cued. Pt continues to be appropriate for post-acute rehab <3 hours a day to maximize functional independence, safety, and return to PLOF.     Recommendations for follow up therapy are one component of a multi-disciplinary discharge planning process, led by the attending physician.  Recommendations may be updated based on patient status, additional functional criteria and insurance authorization.  Follow Up Recommendations  Can patient physically be transported by private vehicle: Yes    Assistance Recommended at Discharge Frequent or constant Supervision/Assistance  Patient can return home with the following A little help with walking and/or transfers;A little help with bathing/dressing/bathroom;Assistance with cooking/housework;Assist for transportation;Help with stairs or ramp for entrance   Equipment Recommendations  Other (comment) (TBD by next venue of care)    Recommendations for Other Services       Precautions / Restrictions Precautions Precautions: Back;Fall Precaution Booklet Issued: Yes (comment) Precaution Comments: Reviewed handout and pt was cued for precautions during functional mobility. Required Braces or Orthoses:  (no brace required per orders) Restrictions Weight Bearing Restrictions: No     Mobility  Bed Mobility Overal bed mobility: Needs Assistance Bed  Mobility: Rolling, Sit to Sidelying, Sidelying to Sit Rolling: Min guard Sidelying to sit: Min guard     Sit to sidelying: Min assist General bed mobility comments: Assist for LE elevation back up into bed at end of session. HOB flat but use of rails required.    Transfers Overall transfer level: Needs assistance Equipment used: Rolling walker (2 wheels) Transfers: Sit to/from Stand Sit to Stand: Min guard           General transfer comment: VC's for hand placement on seated surface for safety.    Ambulation/Gait Ambulation/Gait assistance: Min guard Gait Distance (Feet): 175 Feet Assistive device: Rolling walker (2 wheels) Gait Pattern/deviations: Step-through pattern, Decreased stride length, Trunk flexed, Narrow base of support, Antalgic Gait velocity: Decreased Gait velocity interpretation: <1.31 ft/sec, indicative of household ambulator   General Gait Details: Slow and guarded but without overt LOB. Pt able to tolerate increased ambulation distance this session. VC's throughout for improved posture, closer walker proximity, and forward gaze.   Stairs             Wheelchair Mobility    Modified Rankin (Stroke Patients Only)       Balance Overall balance assessment: Mild deficits observed, not formally tested                                          Cognition Arousal/Alertness: Awake/alert Behavior During Therapy: WFL for tasks assessed/performed Overall Cognitive Status: Impaired/Different from baseline Area of Impairment: Memory                     Memory: Decreased recall of precautions  Exercises      General Comments        Pertinent Vitals/Pain Pain Assessment Pain Assessment: Faces Faces Pain Scale: Hurts little more Pain Location: incisional Pain Descriptors / Indicators: Aching, Sore Pain Intervention(s): Limited activity within patient's tolerance, Monitored during session,  Repositioned    Home Living Family/patient expects to be discharged to:: Private residence Living Arrangements: Alone Available Help at Discharge: Friend(s);Available PRN/intermittently Type of Home: House       Alternate Level Stairs-Number of Steps: flight Home Layout: Two level;Bed/bath upstairs;1/2 bath on main level        Prior Function            PT Goals (current goals can now be found in the care plan section) Acute Rehab PT Goals Patient Stated Goal: Be able to return to her home alone PT Goal Formulation: With patient Time For Goal Achievement: 10/22/22 Potential to Achieve Goals: Good Progress towards PT goals: Progressing toward goals    Frequency    Min 5X/week      PT Plan Current plan remains appropriate    Co-evaluation              AM-PAC PT "6 Clicks" Mobility   Outcome Measure  Help needed turning from your back to your side while in a flat bed without using bedrails?: A Little Help needed moving from lying on your back to sitting on the side of a flat bed without using bedrails?: A Little Help needed moving to and from a bed to a chair (including a wheelchair)?: A Little Help needed standing up from a chair using your arms (e.g., wheelchair or bedside chair)?: A Little Help needed to walk in hospital room?: A Little Help needed climbing 3-5 steps with a railing? : Total 6 Click Score: 16    End of Session Equipment Utilized During Treatment: Gait belt Activity Tolerance: Patient limited by fatigue Patient left: in bed;with call bell/phone within reach;with family/visitor present Nurse Communication: Mobility status PT Visit Diagnosis: Unsteadiness on feet (R26.81);Pain Pain - part of body:  (back)     Time: [id number] PT Time Calculation (min) (ACUTE ONLY): 19 min  Charges:  $Gait Training: 8-22 mins                     Rolinda Roan, PT, DPT Acute Rehabilitation Services Secure Chat Preferred Office: [phone]     Thelma Comp 10/16/2022, 9:34 AM

## 2022-10-16 NOTE — Care Management Obs Status (Signed)
[place] NOTIFICATION   Patient Details  Name: JAEMARIE KOVATCH MRN: [id number] Date of Birth: 1935-10-27   Medicare Observation Status Notification Given:  Yes    Geralynn Ochs, LCSW 10/16/2022, 10:43 AM

## 2022-10-17 DIAGNOSIS — F411 Generalized anxiety disorder: Secondary | ICD-10-CM | POA: Diagnosis not present

## 2022-10-17 DIAGNOSIS — E119 Type 2 diabetes mellitus without complications: Secondary | ICD-10-CM | POA: Diagnosis not present

## 2022-10-17 DIAGNOSIS — E559 Vitamin D deficiency, unspecified: Secondary | ICD-10-CM | POA: Diagnosis not present

## 2022-10-17 DIAGNOSIS — E782 Mixed hyperlipidemia: Secondary | ICD-10-CM | POA: Diagnosis not present

## 2022-10-31 DIAGNOSIS — E119 Type 2 diabetes mellitus without complications: Secondary | ICD-10-CM | POA: Diagnosis not present

## 2022-10-31 DIAGNOSIS — E782 Mixed hyperlipidemia: Secondary | ICD-10-CM | POA: Diagnosis not present

## 2022-10-31 DIAGNOSIS — E559 Vitamin D deficiency, unspecified: Secondary | ICD-10-CM | POA: Diagnosis not present

## 2022-10-31 DIAGNOSIS — F411 Generalized anxiety disorder: Secondary | ICD-10-CM | POA: Diagnosis not present

## 2022-11-07 DIAGNOSIS — E119 Type 2 diabetes mellitus without complications: Secondary | ICD-10-CM | POA: Diagnosis not present

## 2022-11-07 DIAGNOSIS — E782 Mixed hyperlipidemia: Secondary | ICD-10-CM | POA: Diagnosis not present

## 2022-11-07 DIAGNOSIS — E559 Vitamin D deficiency, unspecified: Secondary | ICD-10-CM | POA: Diagnosis not present

## 2022-11-07 DIAGNOSIS — F411 Generalized anxiety disorder: Secondary | ICD-10-CM | POA: Diagnosis not present

## 2022-11-14 DIAGNOSIS — E559 Vitamin D deficiency, unspecified: Secondary | ICD-10-CM | POA: Diagnosis not present

## 2022-11-14 DIAGNOSIS — E119 Type 2 diabetes mellitus without complications: Secondary | ICD-10-CM | POA: Diagnosis not present

## 2022-11-14 DIAGNOSIS — F411 Generalized anxiety disorder: Secondary | ICD-10-CM | POA: Diagnosis not present

## 2022-11-14 DIAGNOSIS — E782 Mixed hyperlipidemia: Secondary | ICD-10-CM | POA: Diagnosis not present

## 2022-11-15 ENCOUNTER — Other Ambulatory Visit: Payer: Self-pay | Admitting: *Deleted

## 2022-11-15 NOTE — Patient Outreach (Signed)
Lisa [name] resides in [place] [facility]. Lisa [name] admitted  to [facility] under [facility] ACO 3-day SNF waiver. Screening for potential [facility] care coordination services as benefit of health plan and primary care provider.   Facility site visit to [facility]. Lisa [name] was walking with therapy. Will follow up at later time.   Raiford Noble, MSN, RN,BSN [facility] Post Acute Care Coordinator [phone] (Direct dial)

## 2022-11-26 ENCOUNTER — Other Ambulatory Visit: Payer: Self-pay | Admitting: *Deleted

## 2022-11-26 NOTE — Patient Outreach (Signed)
Per [facility] Ms. Wagenblast resides in [place] skilled nursing facility. Screening for potential [facility] care coordination services as benefit of health plan and Primary Care Provider.   Secure communication sent to [facility], for collaboration about transition plans and potential [facility] care coordination needs.   Will continue to follow.   Raiford Noble, MSN, RN,BSN [facility] Post Acute Care Coordinator [phone] (Direct dial)

## 2022-11-28 ENCOUNTER — Other Ambulatory Visit: Payer: Self-pay | Admitting: *Deleted

## 2022-11-28 NOTE — Patient Outreach (Signed)
[facility] Post- Acute Care Coordinator follow up. Ms. Dhaliwal resides in [place] skilled nursing facility. Screening for potential care coordination services as a benefit of health plan and primary care provider.  Facility site visit to [facility] skilled nursing facility. Went to bedside to speak with Ms. Peska. However, she was not in the room. Left [facility] Care Management brochure and contact information at bedside.  Will continue to follow and plan outreach as appropriate.   Raiford Noble, MSN, RN,BSN [facility] Post Acute Care Coordinator [phone] (Direct dial)

## 2022-12-02 ENCOUNTER — Other Ambulatory Visit: Payer: Self-pay | Admitting: Cardiology

## 2022-12-03 ENCOUNTER — Other Ambulatory Visit: Payer: Self-pay | Admitting: *Deleted

## 2022-12-03 DIAGNOSIS — G8929 Other chronic pain: Secondary | ICD-10-CM | POA: Diagnosis not present

## 2022-12-03 DIAGNOSIS — M810 Age-related osteoporosis without current pathological fracture: Secondary | ICD-10-CM | POA: Diagnosis not present

## 2022-12-03 DIAGNOSIS — K219 Gastro-esophageal reflux disease without esophagitis: Secondary | ICD-10-CM | POA: Diagnosis not present

## 2022-12-03 DIAGNOSIS — M48062 Spinal stenosis, lumbar region with neurogenic claudication: Secondary | ICD-10-CM | POA: Diagnosis not present

## 2022-12-03 DIAGNOSIS — Z4789 Encounter for other orthopedic aftercare: Secondary | ICD-10-CM | POA: Diagnosis not present

## 2022-12-03 DIAGNOSIS — E1142 Type 2 diabetes mellitus with diabetic polyneuropathy: Secondary | ICD-10-CM | POA: Diagnosis not present

## 2022-12-03 DIAGNOSIS — H353133 Nonexudative age-related macular degeneration, bilateral, advanced atrophic without subfoveal involvement: Secondary | ICD-10-CM | POA: Diagnosis not present

## 2022-12-03 DIAGNOSIS — M47812 Spondylosis without myelopathy or radiculopathy, cervical region: Secondary | ICD-10-CM | POA: Diagnosis not present

## 2022-12-03 DIAGNOSIS — E782 Mixed hyperlipidemia: Secondary | ICD-10-CM | POA: Diagnosis not present

## 2022-12-03 DIAGNOSIS — F411 Generalized anxiety disorder: Secondary | ICD-10-CM | POA: Diagnosis not present

## 2022-12-03 DIAGNOSIS — E559 Vitamin D deficiency, unspecified: Secondary | ICD-10-CM | POA: Diagnosis not present

## 2022-12-03 DIAGNOSIS — Z79899 Other long term (current) drug therapy: Secondary | ICD-10-CM | POA: Diagnosis not present

## 2022-12-03 DIAGNOSIS — M419 Scoliosis, unspecified: Secondary | ICD-10-CM | POA: Diagnosis not present

## 2022-12-03 DIAGNOSIS — I10 Essential (primary) hypertension: Secondary | ICD-10-CM | POA: Diagnosis not present

## 2022-12-03 NOTE — Patient Outreach (Signed)
Per [facility] Mrs. Neubeck discharged from [facility]  skilled nursing facility on 12/02/22. Screening for potential [facility] care coordination services as benefit of health plan and Primary Care Provider.  Secure message previously sent to [facility] Administrator to inquire about transition plans including home health arrangements.   PCP office [facility] has [facility] care management services available.  Secure communication sent to [facility] to notify of SNF discharge.   Raiford Noble, MSN, RN,BSN [facility] Post Acute Care Coordinator [phone] (Direct dial)

## 2022-12-09 DIAGNOSIS — M47812 Spondylosis without myelopathy or radiculopathy, cervical region: Secondary | ICD-10-CM | POA: Diagnosis not present

## 2022-12-09 DIAGNOSIS — F411 Generalized anxiety disorder: Secondary | ICD-10-CM | POA: Diagnosis not present

## 2022-12-09 DIAGNOSIS — M48062 Spinal stenosis, lumbar region with neurogenic claudication: Secondary | ICD-10-CM | POA: Diagnosis not present

## 2022-12-09 DIAGNOSIS — E1142 Type 2 diabetes mellitus with diabetic polyneuropathy: Secondary | ICD-10-CM | POA: Diagnosis not present

## 2022-12-09 DIAGNOSIS — H43812 Vitreous degeneration, left eye: Secondary | ICD-10-CM | POA: Diagnosis not present

## 2022-12-09 DIAGNOSIS — H353133 Nonexudative age-related macular degeneration, bilateral, advanced atrophic without subfoveal involvement: Secondary | ICD-10-CM | POA: Diagnosis not present

## 2022-12-09 DIAGNOSIS — Z4789 Encounter for other orthopedic aftercare: Secondary | ICD-10-CM | POA: Diagnosis not present

## 2022-12-09 DIAGNOSIS — H43811 Vitreous degeneration, right eye: Secondary | ICD-10-CM | POA: Diagnosis not present

## 2022-12-10 DIAGNOSIS — Z4789 Encounter for other orthopedic aftercare: Secondary | ICD-10-CM | POA: Diagnosis not present

## 2022-12-10 DIAGNOSIS — M47812 Spondylosis without myelopathy or radiculopathy, cervical region: Secondary | ICD-10-CM | POA: Diagnosis not present

## 2022-12-10 DIAGNOSIS — M48062 Spinal stenosis, lumbar region with neurogenic claudication: Secondary | ICD-10-CM | POA: Diagnosis not present

## 2022-12-10 DIAGNOSIS — F411 Generalized anxiety disorder: Secondary | ICD-10-CM | POA: Diagnosis not present

## 2022-12-10 DIAGNOSIS — E1142 Type 2 diabetes mellitus with diabetic polyneuropathy: Secondary | ICD-10-CM | POA: Diagnosis not present

## 2022-12-10 DIAGNOSIS — H353133 Nonexudative age-related macular degeneration, bilateral, advanced atrophic without subfoveal involvement: Secondary | ICD-10-CM | POA: Diagnosis not present

## 2022-12-11 ENCOUNTER — Ambulatory Visit (INDEPENDENT_AMBULATORY_CARE_PROVIDER_SITE_OTHER): Payer: Medicare Other | Admitting: Podiatry

## 2022-12-11 ENCOUNTER — Encounter: Payer: Self-pay | Admitting: Podiatry

## 2022-12-11 DIAGNOSIS — E119 Type 2 diabetes mellitus without complications: Secondary | ICD-10-CM | POA: Diagnosis not present

## 2022-12-11 DIAGNOSIS — M79674 Pain in right toe(s): Secondary | ICD-10-CM | POA: Diagnosis not present

## 2022-12-11 DIAGNOSIS — M79675 Pain in left toe(s): Secondary | ICD-10-CM | POA: Diagnosis not present

## 2022-12-11 DIAGNOSIS — B351 Tinea unguium: Secondary | ICD-10-CM

## 2022-12-11 NOTE — Progress Notes (Signed)
This patient returns to my office for at risk foot care.  This patient requires this care by a professional since this patient will be at risk due to having diabetes.  This patient is unable to cut nails herself since the patient cannot reach her nails.These nails are painful walking and wearing shoes.  .  This patient presents for at risk foot care today.  General Appearance  Alert, conversant and in no acute stress.  Vascular  Dorsalis pedis and posterior tibial  pulses are palpable  bilaterally.  Capillary return is within normal limits  bilaterally. Temperature is within normal limits  bilaterally.  Neurologic  Senn-Weinstein monofilament wire test within normal limits  bilaterally. Muscle power within normal limits bilaterally.  Nails Thick disfigured discolored nails with subungual debris  Hallux nails  B/L.[name] Kitchen No evidence of bacterial infection or drainage bilaterally.  Orthopedic  No limitations of motion  feet .  No crepitus or effusions noted.  No bony pathology or digital deformities noted. ADV 4th toe right foot.  Skin  normotropic skin with no porokeratosis noted bilaterally.  No signs of infections or ulcers noted.     Onychomycosis  Pain in right toes  Pain in left toes  Consent was obtained for treatment procedures.   Mechanical debridement of nails 1-5  bilaterally performed with a nail nipper.  Filed with dremel without incident. Padding dispensed for 4th toe left foot.   Return office visit  3 months                  Told patient to return for periodic foot care and evaluation due to potential at risk complications.   Helane Gunther DPM

## 2022-12-12 DIAGNOSIS — Z4789 Encounter for other orthopedic aftercare: Secondary | ICD-10-CM | POA: Diagnosis not present

## 2022-12-12 DIAGNOSIS — F411 Generalized anxiety disorder: Secondary | ICD-10-CM | POA: Diagnosis not present

## 2022-12-12 DIAGNOSIS — M48062 Spinal stenosis, lumbar region with neurogenic claudication: Secondary | ICD-10-CM | POA: Diagnosis not present

## 2022-12-12 DIAGNOSIS — E1142 Type 2 diabetes mellitus with diabetic polyneuropathy: Secondary | ICD-10-CM | POA: Diagnosis not present

## 2022-12-12 DIAGNOSIS — M47812 Spondylosis without myelopathy or radiculopathy, cervical region: Secondary | ICD-10-CM | POA: Diagnosis not present

## 2022-12-12 DIAGNOSIS — H353133 Nonexudative age-related macular degeneration, bilateral, advanced atrophic without subfoveal involvement: Secondary | ICD-10-CM | POA: Diagnosis not present

## 2022-12-17 DIAGNOSIS — Z4789 Encounter for other orthopedic aftercare: Secondary | ICD-10-CM | POA: Diagnosis not present

## 2022-12-17 DIAGNOSIS — H353133 Nonexudative age-related macular degeneration, bilateral, advanced atrophic without subfoveal involvement: Secondary | ICD-10-CM | POA: Diagnosis not present

## 2022-12-17 DIAGNOSIS — M48062 Spinal stenosis, lumbar region with neurogenic claudication: Secondary | ICD-10-CM | POA: Diagnosis not present

## 2022-12-17 DIAGNOSIS — M47812 Spondylosis without myelopathy or radiculopathy, cervical region: Secondary | ICD-10-CM | POA: Diagnosis not present

## 2022-12-17 DIAGNOSIS — F411 Generalized anxiety disorder: Secondary | ICD-10-CM | POA: Diagnosis not present

## 2022-12-17 DIAGNOSIS — L4059 Other psoriatic arthropathy: Secondary | ICD-10-CM | POA: Diagnosis not present

## 2022-12-17 DIAGNOSIS — E1142 Type 2 diabetes mellitus with diabetic polyneuropathy: Secondary | ICD-10-CM | POA: Diagnosis not present

## 2022-12-20 DIAGNOSIS — H353133 Nonexudative age-related macular degeneration, bilateral, advanced atrophic without subfoveal involvement: Secondary | ICD-10-CM | POA: Diagnosis not present

## 2022-12-20 DIAGNOSIS — E1142 Type 2 diabetes mellitus with diabetic polyneuropathy: Secondary | ICD-10-CM | POA: Diagnosis not present

## 2022-12-20 DIAGNOSIS — F411 Generalized anxiety disorder: Secondary | ICD-10-CM | POA: Diagnosis not present

## 2022-12-20 DIAGNOSIS — M48062 Spinal stenosis, lumbar region with neurogenic claudication: Secondary | ICD-10-CM | POA: Diagnosis not present

## 2022-12-20 DIAGNOSIS — Z4789 Encounter for other orthopedic aftercare: Secondary | ICD-10-CM | POA: Diagnosis not present

## 2022-12-20 DIAGNOSIS — M47812 Spondylosis without myelopathy or radiculopathy, cervical region: Secondary | ICD-10-CM | POA: Diagnosis not present

## 2022-12-21 DIAGNOSIS — H353133 Nonexudative age-related macular degeneration, bilateral, advanced atrophic without subfoveal involvement: Secondary | ICD-10-CM | POA: Diagnosis not present

## 2022-12-21 DIAGNOSIS — M47812 Spondylosis without myelopathy or radiculopathy, cervical region: Secondary | ICD-10-CM | POA: Diagnosis not present

## 2022-12-21 DIAGNOSIS — E1142 Type 2 diabetes mellitus with diabetic polyneuropathy: Secondary | ICD-10-CM | POA: Diagnosis not present

## 2022-12-21 DIAGNOSIS — M48062 Spinal stenosis, lumbar region with neurogenic claudication: Secondary | ICD-10-CM | POA: Diagnosis not present

## 2022-12-21 DIAGNOSIS — F411 Generalized anxiety disorder: Secondary | ICD-10-CM | POA: Diagnosis not present

## 2022-12-21 DIAGNOSIS — Z4789 Encounter for other orthopedic aftercare: Secondary | ICD-10-CM | POA: Diagnosis not present

## 2022-12-23 ENCOUNTER — Ambulatory Visit: Payer: Medicare Other | Admitting: Cardiology

## 2022-12-24 ENCOUNTER — Encounter: Payer: Self-pay | Admitting: Cardiology

## 2022-12-24 ENCOUNTER — Ambulatory Visit: Payer: Medicare Other | Attending: Cardiology | Admitting: Cardiology

## 2022-12-24 VITALS — BP 152/56 | HR 64 | Ht 60.0 in | Wt 189.4 lb

## 2022-12-24 DIAGNOSIS — I350 Nonrheumatic aortic (valve) stenosis: Secondary | ICD-10-CM | POA: Diagnosis not present

## 2022-12-24 DIAGNOSIS — I10 Essential (primary) hypertension: Secondary | ICD-10-CM

## 2022-12-24 DIAGNOSIS — F411 Generalized anxiety disorder: Secondary | ICD-10-CM | POA: Diagnosis not present

## 2022-12-24 DIAGNOSIS — Z4789 Encounter for other orthopedic aftercare: Secondary | ICD-10-CM | POA: Diagnosis not present

## 2022-12-24 DIAGNOSIS — E1142 Type 2 diabetes mellitus with diabetic polyneuropathy: Secondary | ICD-10-CM | POA: Diagnosis not present

## 2022-12-24 DIAGNOSIS — Z8249 Family history of ischemic heart disease and other diseases of the circulatory system: Secondary | ICD-10-CM | POA: Diagnosis not present

## 2022-12-24 DIAGNOSIS — M48062 Spinal stenosis, lumbar region with neurogenic claudication: Secondary | ICD-10-CM | POA: Diagnosis not present

## 2022-12-24 DIAGNOSIS — M47812 Spondylosis without myelopathy or radiculopathy, cervical region: Secondary | ICD-10-CM | POA: Diagnosis not present

## 2022-12-24 DIAGNOSIS — H353133 Nonexudative age-related macular degeneration, bilateral, advanced atrophic without subfoveal involvement: Secondary | ICD-10-CM | POA: Diagnosis not present

## 2022-12-24 NOTE — Progress Notes (Signed)
Cardiology Office Note:    Date:  12/24/2022   ID:  Lisa [name], DOB August 21, 1935, MRN [id number]  PCP:  Eloisa Northern, MD   [facility] Providers Cardiologist:  Donato Schultz, MD     Referring MD: Daisy Floro, MD     History of Present Illness:    Lisa [name] is a 87 y.o. female here for follow-up after spinal surgery Dr. Jordan Likes and follow-up of aortic stenosis, hypertension, as well as review of echocardiogram from 12/07/21.  Previously had radioactive iodine ablation.  She is seeing Dr. Sharl Ma. Her brother had CABG 25 years ago.   Today, she is accompanied by Gloris Manchester. Last visit was with her nefew today, Onalee Hua, he is a patient of Dr. Antoine Poche.  Mrs. Donis reports that  heart problems are common in her family.   Sinus pain at times. Right ear tumor. Dr. Jac Canavan. Tumor is stable on imaging.   Thyroid nodules.  Dr. Sharl Ma  Underwent successful spinal surgery.  Feels better.  She was in rehab for some time.  Getting stronger.  Using a rolling walker.  To her surgery echocardiogram showed mild to moderate aortic stenosis.    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   Pneumonia    Psoriasis     Past Surgical History:  Procedure Laterality Date   APPENDECTOMY     BILATERAL SALPINGOOPHORECTOMY  07/22/1994   CATARACT EXTRACTION     CHOLECYSTECTOMY  07/22/2000   COLONOSCOPY  2003/2009/2015   gamma knife     LUMBAR LAMINECTOMY/DECOMPRESSION MICRODISCECTOMY Right 10/14/2022   Procedure: Laminectomy and Foraminotomy - right - Lumbar three-Lumbar four -  Lumbar four-Lumbar five;  Surgeon: Julio Sicks, MD;  Location: [facility] OR;  Service: Neurosurgery;  Laterality: Right;   TOTAL ABDOMINAL HYSTERECTOMY  07/22/1977   FIBROIDS     Current Medications: Current Meds  Medication Sig   acetaminophen (TYLENOL) 650 MG CR tablet Take 1,300 mg by mouth every 8 (eight) hours as needed for pain.   amLODipine (NORVASC) 5 MG tablet Take 5 mg by mouth daily.    Cholecalciferol (VITAMIN D) 2000 units tablet Take 2,000 Units by mouth 4 (four) times a week.   fluticasone (FLONASE) 50 MCG/ACT nasal spray Place 1 spray into both nostrils daily.    folic acid (FOLVITE) 1 MG tablet Take 1 mg by mouth daily.    HYDROcodone-acetaminophen (NORCO/VICODIN) 5-325 MG tablet Take 1 tablet by mouth every 4 (four) hours as needed for moderate pain ((score 4 to 6)).   losartan-hydrochlorothiazide (HYZAAR) 100-25 MG tablet TAKE 1 TABLET BY MOUTH EVERY DAY   meloxicam (MOBIC) 15 MG tablet Take 15 mg by mouth daily as needed for pain.   methotrexate (RHEUMATREX) 2.5 MG tablet Take 7.5-10 mg by mouth See admin instructions. Take 7.5 mg by mouth on Monday and take 10 mg on Sunday   omeprazole (PRILOSEC) 20 MG capsule Take 20 mg by mouth daily.   phenylephrine-shark liver  oil-mineral oil-petrolatum (PREPARATION H) 0.25-14-74.9 % rectal ointment Place 1 Application rectally 2 (two) times daily as needed for hemorrhoids.   Polyethyl Glycol-Propyl Glycol (SYSTANE) 0.4-0.3 % SOLN Place 1 drop into both eyes 3 (three) times daily as needed (dry eyes).   polyethylene glycol (MIRALAX / GLYCOLAX) 17 g packet Take 17 g by mouth daily as needed for moderate constipation.   zinc oxide (BALMEX) 11.3 % CREA cream Apply 1 Application topically 2 (two) times daily.     Allergies:   Lisinopril, Penicillins, and Doxycycline   Social History   Socioeconomic History   Marital status: Single    Spouse name: Not on file   Number of children: Not on file   Years of education: Not on file   Highest  education level: Not on file  Occupational History   Not on file  Tobacco Use   Smoking status: Former   Smokeless tobacco: Never  Vaping Use   Vaping Use: Never used  Substance and Sexual Activity   Alcohol use: Not Currently    Comment: WINE   Drug use: No   Sexual activity: Not on file  Other Topics Concern   Not on file  Social History Narrative   Not on file   Social Determinants of Health   Financial Resource Strain: Not on file  Food Insecurity: Not on file  Transportation Needs: Not on file  Physical Activity: Not on file  Stress: Not on file  Social Connections: Not on file     Family History: The patient's family history includes Goiter in her brother and mother; Heart disease in her brother, brother, father, mother, and paternal grandfather.  ROS:   Please see the history of present illness.   (+) LE Pain and weakness (+) Neuropathy in LE (+) Fatigue  All other systems reviewed and are negative.  EKGs/Labs/Other Studies Reviewed:    The following studies were reviewed today: Cardiac Studies & Procedures       ECHOCARDIOGRAM  ECHOCARDIOGRAM COMPLETE 08/22/2022  Narrative ECHOCARDIOGRAM REPORT    Patient Name:   Lisa [name]   Date of Exam: 08/22/2022 Medical Rec #:  [id number]     Height:       60.0 in Accession #:    [id number]    Weight:       197.8 lb Date of Birth:  19-Apr-1936     BSA:          1.858 m Patient Age:    86 years      BP:           130/60 mmHg Patient Gender: F             HR:           56 bpm. Exam Location:  [address]  Procedure: 2D Echo, Cardiac Doppler and Color Doppler  Indications:    I35.0 Nonrheumatic aortic (valve) stenosis  History:        Patient has prior history of Echocardiogram examinations, most recent 12/07/2021. Signs/Symptoms:Murmur; Risk Factors:Hypertension, Diabetes, Dyslipidemia, Family History of Coronary Artery Disease and Former Smoker. Lower extremity pain and weakness.  Sonographer:    Cathie Beams RCS Referring Phys: [phone] Jilliann Subramanian C Eytan Carrigan  IMPRESSIONS   1. Left ventricular ejection fraction, by estimation, is 70 to 75%. The left ventricle has hyperdynamic function. The left ventricle has no regional wall motion abnormalities. There is mild concentric left ventricular hypertrophy. Left ventricular diastolic parameters are consistent with Grade I diastolic dysfunction (  impaired relaxation). 2. The aortic valve is tricuspid. There is moderate calcification of the aortic valve. There is severe thickening of the aortic valve. Aortic valve regurgitation is mild to moderate. Mild to moderate aortic valve stenosis. Aortic valve area, by VTI measures 1.55 cm. Aortic valve mean gradient measures 15.0 mmHg. Aortic valve Vmax measures 2.70 m/s. 3. Right ventricular systolic function is normal. The right ventricular size is normal. There is normal pulmonary artery systolic pressure. The estimated right ventricular systolic pressure is 24.2 mmHg. 4. Left atrial size was mildly dilated. 5. The mitral valve is grossly normal. Trivial mitral valve regurgitation. Moderate mitral annular calcification. 6. The inferior vena cava is normal in size with greater than 50% respiratory variability, suggesting right atrial pressure of 3 mmHg.  Comparison(s): Prior TTE in 11/2021 with mean AoV gradient 17.61mmHg, Vmax 3.103m/s which appears relatively stable on current study.  FINDINGS Left Ventricle: Left ventricular ejection fraction, by estimation, is 70 to 75%. The left ventricle has hyperdynamic function. The left ventricle has no regional wall motion abnormalities. The left ventricular internal cavity size was normal in size. There is mild concentric left ventricular hypertrophy. Left ventricular diastolic parameters are consistent with Grade I diastolic dysfunction (impaired relaxation).  Right Ventricle: The right ventricular size is normal. No increase in right ventricular wall thickness. Right  ventricular systolic function is normal. There is normal pulmonary artery systolic pressure. The tricuspid regurgitant velocity is 2.30 m/s, and with an assumed right atrial pressure of 3 mmHg, the estimated right ventricular systolic pressure is 24.2 mmHg.  Left Atrium: Left atrial size was mildly dilated.  Right Atrium: Right atrial size was normal in size.  Pericardium: There is no evidence of pericardial effusion.  Mitral Valve: The mitral valve is grossly normal. Moderate mitral annular calcification. Trivial mitral valve regurgitation.  Tricuspid Valve: The tricuspid valve is normal in structure. Tricuspid valve regurgitation is trivial.  Aortic Valve: The aortic valve is tricuspid. There is moderate calcification of the aortic valve. There is severe thickening of the aortic valve. Aortic valve regurgitation is mild to moderate. Aortic regurgitation PHT measures 594 msec. Mild to moderate aortic stenosis is present. Aortic valve mean gradient measures 15.0 mmHg. Aortic valve peak gradient measures 29.2 mmHg. Aortic valve area, by VTI measures 1.55 cm.  Pulmonic Valve: The pulmonic valve was normal in structure. Pulmonic valve regurgitation is trivial.  Aorta: The aortic root and ascending aorta are structurally normal, with no evidence of dilitation.  Venous: The inferior vena cava is normal in size with greater than 50% respiratory variability, suggesting right atrial pressure of 3 mmHg.  IAS/Shunts: The atrial septum is grossly normal.   LEFT VENTRICLE PLAX 2D LVIDd:         3.60 cm   Diastology LVIDs:         1.80 cm   LV e' medial:    8.27 cm/s LV PW:         1.20 cm   LV E/e' medial:  11.4 LV IVS:        1.10 cm   LV e' lateral:   8.81 cm/s LVOT diam:     1.90 cm   LV E/e' lateral: 10.7 LV SV:         89 LV SV Index:   48 LVOT Area:     2.84 cm   RIGHT VENTRICLE RV Basal diam:  3.20 cm RV Mid diam:    3.10 cm RV S prime:     18.70 cm/s  TAPSE (M-mode): 2.9  cm RVSP:           24.2 mmHg  LEFT ATRIUM             Index        RIGHT ATRIUM           Index LA diam:        4.30 cm 2.31 cm/m   RA Pressure: 3.00 mmHg LA Vol (A2C):   32.6 ml 17.55 ml/m  RA Area:     14.00 cm LA Vol (A4C):   64.3 ml 34.61 ml/m  RA Volume:   32.60 ml  17.55 ml/m LA Biplane Vol: 45.4 ml 24.44 ml/m AORTIC VALVE AV Area (Vmax):    1.45 cm AV Area (Vmean):   1.36 cm AV Area (VTI):     1.55 cm AV Vmax:           270.00 cm/s AV Vmean:          169.500 cm/s AV VTI:            0.574 m AV Peak Grad:      29.2 mmHg AV Mean Grad:      15.0 mmHg LVOT Vmax:         138.00 cm/s LVOT Vmean:        81.600 cm/s LVOT VTI:          0.315 m LVOT/AV VTI ratio: 0.55 AI PHT:            594 msec  AORTA Ao Root diam: 2.50 cm Ao Asc diam:  3.00 cm  MITRAL VALVE                TRICUSPID VALVE MV Area (PHT): 3.68 cm     TR Peak grad:   21.2 mmHg MV Decel Time: 206 msec     TR Vmax:        230.00 cm/s MV E velocity: 94.60 cm/s   Estimated RAP:  3.00 mmHg MV A velocity: 119.00 cm/s  RVSP:           24.2 mmHg MV E/A ratio:  0.79 SHUNTS Systemic VTI:  0.32 m Systemic Diam: 1.90 cm  Laurance Flatten MD Electronically signed by Laurance Flatten MD Signature Date/Time: 08/22/2022/3:11:31 PM    Final             EKG: EKG is personally reviewed.   08/01/2022-normal sinus rhythm 61 poor R wave progression.  12/18/2021 EKG: Rate 61. Sinus Rhythm, inferior infarction pattern.  12/11/2020 EKG: Sinus rhythm 72 with sinus arrhythmia. Sinus rhythm 62 on 12/16/2019.  Recent Labs: 10/08/2022: BUN 16; Creatinine, Ser 0.65; Hemoglobin 14.4; Platelets 289; Potassium 3.4; Sodium 138  Recent Lipid Panel No results found for: "CHOL", "TRIG", "HDL", "CHOLHDL", "VLDL", "LDLCALC", "LDLDIRECT"   Risk Assessment/Calculations:      Physical Exam:    VS:  BP (!) 152/56   Pulse 64   Ht 5' (1.524 m)   Wt 189 lb 6.4 oz (85.9 kg)   LMP  (LMP Unknown)   SpO2 95%   BMI 36.99 kg/m      Wt Readings from Last 3 Encounters:  12/24/22 189 lb 6.4 oz (85.9 kg)  10/14/22 188 lb (85.3 kg)  10/08/22 188 lb 12.8 oz (85.6 kg)     GEN:  Well nourished, well developed in no acute distress HEENT: Normal NECK: No JVD; radiation carotid bruits LYMPHATICS: No lymphadenopathy CARDIAC: RRR, 3/6 SM RUSB, no rubs, gallops, no changes RESPIRATORY:  Clear  to auscultation without rales, wheezing or rhonchi  ABDOMEN: Soft, non-tender, non-distended MUSCULOSKELETAL:  Chronic edema; No deformity  SKIN: Warm and dry NEUROLOGIC:  Alert and oriented x 3 PSYCHIATRIC:  Normal affect   ASSESSMENT:    1. Nonrheumatic aortic valve stenosis   2. Essential hypertension, benign   3. Family history of coronary artery disease       PLAN:    In order of problems listed above:  Aortic stenosis Echocardiogram 2024 shows mild to moderate aortic valve stenosis.  Not severe.  No indication for surgical or TAVR intervention at this time.  We will continue to monitor.  We will check echocardiogram in 1 to 2 years.  Murmur on exam.  Essential hypertension, benign Continue with current medication management, amlodipine 5 mg a day, losartan hydrochlorothiazide 100/25 mg a day.  She was worried about the HCTZ portion causing dry throat.  Fluticasone spray may also be contributing to this.  I would like for her to continue with HCTZ.   Family history of coronary artery disease  Her father, brother, 3 nephews all of had CAD.  Continue with aggressive secondary risk factor prevention.  She is on aspirin 81 mg every other day.  No changes today  Psoriatic arthritis (HCC) Has taken methotrexate.  Aspirin every other day.  Had thrush treated.  No changes      Medication Adjustments/Labs and Tests Ordered: Current medicines are reviewed at length with the patient today.  Concerns regarding medicines are outlined above.  No orders of the defined types were placed in this encounter.  No orders of the  defined types were placed in this encounter.   Patient Instructions  Medication Instructions:  The current medical regimen is effective;  continue present plan and medications.  *If you need a refill on your cardiac medications before your next appointment, please call your pharmacy*  Follow-Up: At [facility], you and your health needs are our priority.  As part of our continuing mission to provide you with exceptional heart care, we have created designated Provider Care Teams.  These Care Teams include your primary Cardiologist (physician) and Advanced Practice Providers (APPs -  Physician Assistants and Nurse Practitioners) who all work together to provide you with the care you need, when you need it.  We recommend signing up for the patient portal called "MyChart".  Sign up information is provided on this After Visit Summary.  MyChart is used to connect with patients for Virtual Visits (Telemedicine).  Patients are able to view lab/test results, encounter notes, upcoming appointments, etc.  Non-urgent messages can be sent to your provider as well.   To learn more about what you can do with MyChart, go to [email or web address].    Your next appointment:   1 year(s)  Provider:   Donato Schultz, MD        F/U in 1 year

## 2022-12-24 NOTE — Patient Instructions (Signed)
Medication Instructions:  The current medical regimen is effective;  continue present plan and medications.  *If you need a refill on your cardiac medications before your next appointment, please call your pharmacy*  Follow-Up: At [facility], you and your health needs are our priority.  As part of our continuing mission to provide you with exceptional heart care, we have created designated Provider Care Teams.  These Care Teams include your primary Cardiologist (physician) and Advanced Practice Providers (APPs -  Physician Assistants and Nurse Practitioners) who all work together to provide you with the care you need, when you need it.  We recommend signing up for the patient portal called "MyChart".  Sign up information is provided on this After Visit Summary.  MyChart is used to connect with patients for Virtual Visits (Telemedicine).  Patients are able to view lab/test results, encounter notes, upcoming appointments, etc.  Non-urgent messages can be sent to your provider as well.   To learn more about what you can do with MyChart, go to [email or web address].    Your next appointment:   1 year(s)  Provider:   Mark Skains, MD      

## 2022-12-28 DIAGNOSIS — F411 Generalized anxiety disorder: Secondary | ICD-10-CM | POA: Diagnosis not present

## 2022-12-28 DIAGNOSIS — H353133 Nonexudative age-related macular degeneration, bilateral, advanced atrophic without subfoveal involvement: Secondary | ICD-10-CM | POA: Diagnosis not present

## 2022-12-28 DIAGNOSIS — Z4789 Encounter for other orthopedic aftercare: Secondary | ICD-10-CM | POA: Diagnosis not present

## 2022-12-28 DIAGNOSIS — M47812 Spondylosis without myelopathy or radiculopathy, cervical region: Secondary | ICD-10-CM | POA: Diagnosis not present

## 2022-12-28 DIAGNOSIS — E1142 Type 2 diabetes mellitus with diabetic polyneuropathy: Secondary | ICD-10-CM | POA: Diagnosis not present

## 2022-12-28 DIAGNOSIS — M48062 Spinal stenosis, lumbar region with neurogenic claudication: Secondary | ICD-10-CM | POA: Diagnosis not present

## 2022-12-31 DIAGNOSIS — M47812 Spondylosis without myelopathy or radiculopathy, cervical region: Secondary | ICD-10-CM | POA: Diagnosis not present

## 2022-12-31 DIAGNOSIS — Z4789 Encounter for other orthopedic aftercare: Secondary | ICD-10-CM | POA: Diagnosis not present

## 2022-12-31 DIAGNOSIS — E1142 Type 2 diabetes mellitus with diabetic polyneuropathy: Secondary | ICD-10-CM | POA: Diagnosis not present

## 2022-12-31 DIAGNOSIS — F411 Generalized anxiety disorder: Secondary | ICD-10-CM | POA: Diagnosis not present

## 2022-12-31 DIAGNOSIS — H353133 Nonexudative age-related macular degeneration, bilateral, advanced atrophic without subfoveal involvement: Secondary | ICD-10-CM | POA: Diagnosis not present

## 2022-12-31 DIAGNOSIS — M48062 Spinal stenosis, lumbar region with neurogenic claudication: Secondary | ICD-10-CM | POA: Diagnosis not present

## 2023-01-01 DIAGNOSIS — E1142 Type 2 diabetes mellitus with diabetic polyneuropathy: Secondary | ICD-10-CM | POA: Diagnosis not present

## 2023-01-01 DIAGNOSIS — F411 Generalized anxiety disorder: Secondary | ICD-10-CM | POA: Diagnosis not present

## 2023-01-01 DIAGNOSIS — H353133 Nonexudative age-related macular degeneration, bilateral, advanced atrophic without subfoveal involvement: Secondary | ICD-10-CM | POA: Diagnosis not present

## 2023-01-01 DIAGNOSIS — M48062 Spinal stenosis, lumbar region with neurogenic claudication: Secondary | ICD-10-CM | POA: Diagnosis not present

## 2023-01-01 DIAGNOSIS — M47812 Spondylosis without myelopathy or radiculopathy, cervical region: Secondary | ICD-10-CM | POA: Diagnosis not present

## 2023-01-01 DIAGNOSIS — Z4789 Encounter for other orthopedic aftercare: Secondary | ICD-10-CM | POA: Diagnosis not present

## 2023-01-02 DIAGNOSIS — Z4789 Encounter for other orthopedic aftercare: Secondary | ICD-10-CM | POA: Diagnosis not present

## 2023-01-02 DIAGNOSIS — E1142 Type 2 diabetes mellitus with diabetic polyneuropathy: Secondary | ICD-10-CM | POA: Diagnosis not present

## 2023-01-02 DIAGNOSIS — G8929 Other chronic pain: Secondary | ICD-10-CM | POA: Diagnosis not present

## 2023-01-02 DIAGNOSIS — Z79899 Other long term (current) drug therapy: Secondary | ICD-10-CM | POA: Diagnosis not present

## 2023-01-02 DIAGNOSIS — M810 Age-related osteoporosis without current pathological fracture: Secondary | ICD-10-CM | POA: Diagnosis not present

## 2023-01-02 DIAGNOSIS — H353133 Nonexudative age-related macular degeneration, bilateral, advanced atrophic without subfoveal involvement: Secondary | ICD-10-CM | POA: Diagnosis not present

## 2023-01-02 DIAGNOSIS — M48062 Spinal stenosis, lumbar region with neurogenic claudication: Secondary | ICD-10-CM | POA: Diagnosis not present

## 2023-01-02 DIAGNOSIS — M419 Scoliosis, unspecified: Secondary | ICD-10-CM | POA: Diagnosis not present

## 2023-01-02 DIAGNOSIS — E559 Vitamin D deficiency, unspecified: Secondary | ICD-10-CM | POA: Diagnosis not present

## 2023-01-02 DIAGNOSIS — E782 Mixed hyperlipidemia: Secondary | ICD-10-CM | POA: Diagnosis not present

## 2023-01-02 DIAGNOSIS — M47812 Spondylosis without myelopathy or radiculopathy, cervical region: Secondary | ICD-10-CM | POA: Diagnosis not present

## 2023-01-02 DIAGNOSIS — I10 Essential (primary) hypertension: Secondary | ICD-10-CM | POA: Diagnosis not present

## 2023-01-02 DIAGNOSIS — K219 Gastro-esophageal reflux disease without esophagitis: Secondary | ICD-10-CM | POA: Diagnosis not present

## 2023-01-02 DIAGNOSIS — F411 Generalized anxiety disorder: Secondary | ICD-10-CM | POA: Diagnosis not present

## 2023-01-03 DIAGNOSIS — Z923 Personal history of irradiation: Secondary | ICD-10-CM | POA: Diagnosis not present

## 2023-01-03 DIAGNOSIS — E0520 Thyrotoxicosis with toxic multinodular goiter without thyrotoxic crisis or storm: Secondary | ICD-10-CM | POA: Diagnosis not present

## 2023-01-06 DIAGNOSIS — M47812 Spondylosis without myelopathy or radiculopathy, cervical region: Secondary | ICD-10-CM | POA: Diagnosis not present

## 2023-01-06 DIAGNOSIS — Z4789 Encounter for other orthopedic aftercare: Secondary | ICD-10-CM | POA: Diagnosis not present

## 2023-01-06 DIAGNOSIS — F411 Generalized anxiety disorder: Secondary | ICD-10-CM | POA: Diagnosis not present

## 2023-01-06 DIAGNOSIS — E1142 Type 2 diabetes mellitus with diabetic polyneuropathy: Secondary | ICD-10-CM | POA: Diagnosis not present

## 2023-01-06 DIAGNOSIS — M48062 Spinal stenosis, lumbar region with neurogenic claudication: Secondary | ICD-10-CM | POA: Diagnosis not present

## 2023-01-06 DIAGNOSIS — H353133 Nonexudative age-related macular degeneration, bilateral, advanced atrophic without subfoveal involvement: Secondary | ICD-10-CM | POA: Diagnosis not present

## 2023-01-10 DIAGNOSIS — H353133 Nonexudative age-related macular degeneration, bilateral, advanced atrophic without subfoveal involvement: Secondary | ICD-10-CM | POA: Diagnosis not present

## 2023-01-10 DIAGNOSIS — E1142 Type 2 diabetes mellitus with diabetic polyneuropathy: Secondary | ICD-10-CM | POA: Diagnosis not present

## 2023-01-10 DIAGNOSIS — M47812 Spondylosis without myelopathy or radiculopathy, cervical region: Secondary | ICD-10-CM | POA: Diagnosis not present

## 2023-01-10 DIAGNOSIS — M48062 Spinal stenosis, lumbar region with neurogenic claudication: Secondary | ICD-10-CM | POA: Diagnosis not present

## 2023-01-10 DIAGNOSIS — F411 Generalized anxiety disorder: Secondary | ICD-10-CM | POA: Diagnosis not present

## 2023-01-10 DIAGNOSIS — Z4789 Encounter for other orthopedic aftercare: Secondary | ICD-10-CM | POA: Diagnosis not present

## 2023-01-11 DIAGNOSIS — E0590 Thyrotoxicosis, unspecified without thyrotoxic crisis or storm: Secondary | ICD-10-CM | POA: Diagnosis not present

## 2023-01-11 DIAGNOSIS — Z8701 Personal history of pneumonia (recurrent): Secondary | ICD-10-CM | POA: Diagnosis not present

## 2023-01-11 DIAGNOSIS — M48062 Spinal stenosis, lumbar region with neurogenic claudication: Secondary | ICD-10-CM | POA: Diagnosis not present

## 2023-01-11 DIAGNOSIS — R600 Localized edema: Secondary | ICD-10-CM | POA: Diagnosis not present

## 2023-01-11 DIAGNOSIS — E782 Mixed hyperlipidemia: Secondary | ICD-10-CM | POA: Diagnosis not present

## 2023-01-11 DIAGNOSIS — K219 Gastro-esophageal reflux disease without esophagitis: Secondary | ICD-10-CM | POA: Diagnosis not present

## 2023-01-11 DIAGNOSIS — R011 Cardiac murmur, unspecified: Secondary | ICD-10-CM | POA: Diagnosis not present

## 2023-01-11 DIAGNOSIS — I119 Hypertensive heart disease without heart failure: Secondary | ICD-10-CM | POA: Diagnosis not present

## 2023-01-11 DIAGNOSIS — F411 Generalized anxiety disorder: Secondary | ICD-10-CM | POA: Diagnosis not present

## 2023-01-11 DIAGNOSIS — K5790 Diverticulosis of intestine, part unspecified, without perforation or abscess without bleeding: Secondary | ICD-10-CM | POA: Diagnosis not present

## 2023-01-11 DIAGNOSIS — F32A Depression, unspecified: Secondary | ICD-10-CM | POA: Diagnosis not present

## 2023-01-11 DIAGNOSIS — I350 Nonrheumatic aortic (valve) stenosis: Secondary | ICD-10-CM | POA: Diagnosis not present

## 2023-01-11 DIAGNOSIS — E1142 Type 2 diabetes mellitus with diabetic polyneuropathy: Secondary | ICD-10-CM | POA: Diagnosis not present

## 2023-01-11 DIAGNOSIS — Z515 Encounter for palliative care: Secondary | ICD-10-CM | POA: Diagnosis not present

## 2023-01-11 DIAGNOSIS — H353133 Nonexudative age-related macular degeneration, bilateral, advanced atrophic without subfoveal involvement: Secondary | ICD-10-CM | POA: Diagnosis not present

## 2023-01-11 DIAGNOSIS — M1990 Unspecified osteoarthritis, unspecified site: Secondary | ICD-10-CM | POA: Diagnosis not present

## 2023-01-11 DIAGNOSIS — Z86018 Personal history of other benign neoplasm: Secondary | ICD-10-CM | POA: Diagnosis not present

## 2023-01-11 DIAGNOSIS — H903 Sensorineural hearing loss, bilateral: Secondary | ICD-10-CM | POA: Diagnosis not present

## 2023-01-13 DIAGNOSIS — R011 Cardiac murmur, unspecified: Secondary | ICD-10-CM | POA: Diagnosis not present

## 2023-01-13 DIAGNOSIS — M1990 Unspecified osteoarthritis, unspecified site: Secondary | ICD-10-CM | POA: Diagnosis not present

## 2023-01-13 DIAGNOSIS — M48062 Spinal stenosis, lumbar region with neurogenic claudication: Secondary | ICD-10-CM | POA: Diagnosis not present

## 2023-01-13 DIAGNOSIS — K5790 Diverticulosis of intestine, part unspecified, without perforation or abscess without bleeding: Secondary | ICD-10-CM | POA: Diagnosis not present

## 2023-01-13 DIAGNOSIS — I119 Hypertensive heart disease without heart failure: Secondary | ICD-10-CM | POA: Diagnosis not present

## 2023-01-13 DIAGNOSIS — R600 Localized edema: Secondary | ICD-10-CM | POA: Diagnosis not present

## 2023-01-15 DIAGNOSIS — R600 Localized edema: Secondary | ICD-10-CM | POA: Diagnosis not present

## 2023-01-15 DIAGNOSIS — K5790 Diverticulosis of intestine, part unspecified, without perforation or abscess without bleeding: Secondary | ICD-10-CM | POA: Diagnosis not present

## 2023-01-15 DIAGNOSIS — M48062 Spinal stenosis, lumbar region with neurogenic claudication: Secondary | ICD-10-CM | POA: Diagnosis not present

## 2023-01-15 DIAGNOSIS — I119 Hypertensive heart disease without heart failure: Secondary | ICD-10-CM | POA: Diagnosis not present

## 2023-01-15 DIAGNOSIS — M1990 Unspecified osteoarthritis, unspecified site: Secondary | ICD-10-CM | POA: Diagnosis not present

## 2023-01-15 DIAGNOSIS — R011 Cardiac murmur, unspecified: Secondary | ICD-10-CM | POA: Diagnosis not present

## 2023-01-16 DIAGNOSIS — K5790 Diverticulosis of intestine, part unspecified, without perforation or abscess without bleeding: Secondary | ICD-10-CM | POA: Diagnosis not present

## 2023-01-16 DIAGNOSIS — M1990 Unspecified osteoarthritis, unspecified site: Secondary | ICD-10-CM | POA: Diagnosis not present

## 2023-01-16 DIAGNOSIS — I119 Hypertensive heart disease without heart failure: Secondary | ICD-10-CM | POA: Diagnosis not present

## 2023-01-16 DIAGNOSIS — R600 Localized edema: Secondary | ICD-10-CM | POA: Diagnosis not present

## 2023-01-16 DIAGNOSIS — M48062 Spinal stenosis, lumbar region with neurogenic claudication: Secondary | ICD-10-CM | POA: Diagnosis not present

## 2023-01-16 DIAGNOSIS — R011 Cardiac murmur, unspecified: Secondary | ICD-10-CM | POA: Diagnosis not present

## 2023-01-17 DIAGNOSIS — I119 Hypertensive heart disease without heart failure: Secondary | ICD-10-CM | POA: Diagnosis not present

## 2023-01-17 DIAGNOSIS — K5790 Diverticulosis of intestine, part unspecified, without perforation or abscess without bleeding: Secondary | ICD-10-CM | POA: Diagnosis not present

## 2023-01-17 DIAGNOSIS — M48062 Spinal stenosis, lumbar region with neurogenic claudication: Secondary | ICD-10-CM | POA: Diagnosis not present

## 2023-01-17 DIAGNOSIS — R011 Cardiac murmur, unspecified: Secondary | ICD-10-CM | POA: Diagnosis not present

## 2023-01-17 DIAGNOSIS — M1990 Unspecified osteoarthritis, unspecified site: Secondary | ICD-10-CM | POA: Diagnosis not present

## 2023-01-17 DIAGNOSIS — R600 Localized edema: Secondary | ICD-10-CM | POA: Diagnosis not present

## 2023-01-20 DIAGNOSIS — E0590 Thyrotoxicosis, unspecified without thyrotoxic crisis or storm: Secondary | ICD-10-CM | POA: Diagnosis not present

## 2023-01-20 DIAGNOSIS — K5790 Diverticulosis of intestine, part unspecified, without perforation or abscess without bleeding: Secondary | ICD-10-CM | POA: Diagnosis not present

## 2023-01-20 DIAGNOSIS — F32A Depression, unspecified: Secondary | ICD-10-CM | POA: Diagnosis not present

## 2023-01-20 DIAGNOSIS — M1990 Unspecified osteoarthritis, unspecified site: Secondary | ICD-10-CM | POA: Diagnosis not present

## 2023-01-20 DIAGNOSIS — M48062 Spinal stenosis, lumbar region with neurogenic claudication: Secondary | ICD-10-CM | POA: Diagnosis not present

## 2023-01-20 DIAGNOSIS — Z86018 Personal history of other benign neoplasm: Secondary | ICD-10-CM | POA: Diagnosis not present

## 2023-01-20 DIAGNOSIS — I119 Hypertensive heart disease without heart failure: Secondary | ICD-10-CM | POA: Diagnosis not present

## 2023-01-20 DIAGNOSIS — Z515 Encounter for palliative care: Secondary | ICD-10-CM | POA: Diagnosis not present

## 2023-01-20 DIAGNOSIS — K219 Gastro-esophageal reflux disease without esophagitis: Secondary | ICD-10-CM | POA: Diagnosis not present

## 2023-01-20 DIAGNOSIS — R600 Localized edema: Secondary | ICD-10-CM | POA: Diagnosis not present

## 2023-01-20 DIAGNOSIS — R011 Cardiac murmur, unspecified: Secondary | ICD-10-CM | POA: Diagnosis not present

## 2023-01-20 DIAGNOSIS — F411 Generalized anxiety disorder: Secondary | ICD-10-CM | POA: Diagnosis not present

## 2023-01-20 DIAGNOSIS — H353133 Nonexudative age-related macular degeneration, bilateral, advanced atrophic without subfoveal involvement: Secondary | ICD-10-CM | POA: Diagnosis not present

## 2023-01-20 DIAGNOSIS — Z8701 Personal history of pneumonia (recurrent): Secondary | ICD-10-CM | POA: Diagnosis not present

## 2023-01-20 DIAGNOSIS — E782 Mixed hyperlipidemia: Secondary | ICD-10-CM | POA: Diagnosis not present

## 2023-01-20 DIAGNOSIS — E1142 Type 2 diabetes mellitus with diabetic polyneuropathy: Secondary | ICD-10-CM | POA: Diagnosis not present

## 2023-01-20 DIAGNOSIS — I350 Nonrheumatic aortic (valve) stenosis: Secondary | ICD-10-CM | POA: Diagnosis not present

## 2023-01-20 DIAGNOSIS — H903 Sensorineural hearing loss, bilateral: Secondary | ICD-10-CM | POA: Diagnosis not present

## 2023-01-22 DIAGNOSIS — K5790 Diverticulosis of intestine, part unspecified, without perforation or abscess without bleeding: Secondary | ICD-10-CM | POA: Diagnosis not present

## 2023-01-22 DIAGNOSIS — M1990 Unspecified osteoarthritis, unspecified site: Secondary | ICD-10-CM | POA: Diagnosis not present

## 2023-01-22 DIAGNOSIS — M48062 Spinal stenosis, lumbar region with neurogenic claudication: Secondary | ICD-10-CM | POA: Diagnosis not present

## 2023-01-22 DIAGNOSIS — R600 Localized edema: Secondary | ICD-10-CM | POA: Diagnosis not present

## 2023-01-22 DIAGNOSIS — R011 Cardiac murmur, unspecified: Secondary | ICD-10-CM | POA: Diagnosis not present

## 2023-01-22 DIAGNOSIS — I119 Hypertensive heart disease without heart failure: Secondary | ICD-10-CM | POA: Diagnosis not present

## 2023-01-24 DIAGNOSIS — M1990 Unspecified osteoarthritis, unspecified site: Secondary | ICD-10-CM | POA: Diagnosis not present

## 2023-01-24 DIAGNOSIS — R011 Cardiac murmur, unspecified: Secondary | ICD-10-CM | POA: Diagnosis not present

## 2023-01-24 DIAGNOSIS — I119 Hypertensive heart disease without heart failure: Secondary | ICD-10-CM | POA: Diagnosis not present

## 2023-01-24 DIAGNOSIS — R600 Localized edema: Secondary | ICD-10-CM | POA: Diagnosis not present

## 2023-01-24 DIAGNOSIS — M48062 Spinal stenosis, lumbar region with neurogenic claudication: Secondary | ICD-10-CM | POA: Diagnosis not present

## 2023-01-24 DIAGNOSIS — K5790 Diverticulosis of intestine, part unspecified, without perforation or abscess without bleeding: Secondary | ICD-10-CM | POA: Diagnosis not present

## 2023-01-27 DIAGNOSIS — M1990 Unspecified osteoarthritis, unspecified site: Secondary | ICD-10-CM | POA: Diagnosis not present

## 2023-01-27 DIAGNOSIS — R011 Cardiac murmur, unspecified: Secondary | ICD-10-CM | POA: Diagnosis not present

## 2023-01-27 DIAGNOSIS — K5790 Diverticulosis of intestine, part unspecified, without perforation or abscess without bleeding: Secondary | ICD-10-CM | POA: Diagnosis not present

## 2023-01-27 DIAGNOSIS — M48062 Spinal stenosis, lumbar region with neurogenic claudication: Secondary | ICD-10-CM | POA: Diagnosis not present

## 2023-01-27 DIAGNOSIS — I119 Hypertensive heart disease without heart failure: Secondary | ICD-10-CM | POA: Diagnosis not present

## 2023-01-27 DIAGNOSIS — R600 Localized edema: Secondary | ICD-10-CM | POA: Diagnosis not present

## 2023-01-29 DIAGNOSIS — R011 Cardiac murmur, unspecified: Secondary | ICD-10-CM | POA: Diagnosis not present

## 2023-01-29 DIAGNOSIS — R600 Localized edema: Secondary | ICD-10-CM | POA: Diagnosis not present

## 2023-01-29 DIAGNOSIS — M48062 Spinal stenosis, lumbar region with neurogenic claudication: Secondary | ICD-10-CM | POA: Diagnosis not present

## 2023-01-29 DIAGNOSIS — I119 Hypertensive heart disease without heart failure: Secondary | ICD-10-CM | POA: Diagnosis not present

## 2023-01-29 DIAGNOSIS — K5790 Diverticulosis of intestine, part unspecified, without perforation or abscess without bleeding: Secondary | ICD-10-CM | POA: Diagnosis not present

## 2023-01-29 DIAGNOSIS — M1990 Unspecified osteoarthritis, unspecified site: Secondary | ICD-10-CM | POA: Diagnosis not present

## 2023-01-31 DIAGNOSIS — K5790 Diverticulosis of intestine, part unspecified, without perforation or abscess without bleeding: Secondary | ICD-10-CM | POA: Diagnosis not present

## 2023-01-31 DIAGNOSIS — I119 Hypertensive heart disease without heart failure: Secondary | ICD-10-CM | POA: Diagnosis not present

## 2023-01-31 DIAGNOSIS — R600 Localized edema: Secondary | ICD-10-CM | POA: Diagnosis not present

## 2023-01-31 DIAGNOSIS — M1990 Unspecified osteoarthritis, unspecified site: Secondary | ICD-10-CM | POA: Diagnosis not present

## 2023-01-31 DIAGNOSIS — R011 Cardiac murmur, unspecified: Secondary | ICD-10-CM | POA: Diagnosis not present

## 2023-01-31 DIAGNOSIS — M48062 Spinal stenosis, lumbar region with neurogenic claudication: Secondary | ICD-10-CM | POA: Diagnosis not present

## 2023-02-03 DIAGNOSIS — M1990 Unspecified osteoarthritis, unspecified site: Secondary | ICD-10-CM | POA: Diagnosis not present

## 2023-02-03 DIAGNOSIS — R011 Cardiac murmur, unspecified: Secondary | ICD-10-CM | POA: Diagnosis not present

## 2023-02-03 DIAGNOSIS — K5790 Diverticulosis of intestine, part unspecified, without perforation or abscess without bleeding: Secondary | ICD-10-CM | POA: Diagnosis not present

## 2023-02-03 DIAGNOSIS — R600 Localized edema: Secondary | ICD-10-CM | POA: Diagnosis not present

## 2023-02-03 DIAGNOSIS — I119 Hypertensive heart disease without heart failure: Secondary | ICD-10-CM | POA: Diagnosis not present

## 2023-02-03 DIAGNOSIS — M48062 Spinal stenosis, lumbar region with neurogenic claudication: Secondary | ICD-10-CM | POA: Diagnosis not present

## 2023-02-05 DIAGNOSIS — I119 Hypertensive heart disease without heart failure: Secondary | ICD-10-CM | POA: Diagnosis not present

## 2023-02-05 DIAGNOSIS — K5790 Diverticulosis of intestine, part unspecified, without perforation or abscess without bleeding: Secondary | ICD-10-CM | POA: Diagnosis not present

## 2023-02-05 DIAGNOSIS — M48062 Spinal stenosis, lumbar region with neurogenic claudication: Secondary | ICD-10-CM | POA: Diagnosis not present

## 2023-02-05 DIAGNOSIS — M1990 Unspecified osteoarthritis, unspecified site: Secondary | ICD-10-CM | POA: Diagnosis not present

## 2023-02-05 DIAGNOSIS — R600 Localized edema: Secondary | ICD-10-CM | POA: Diagnosis not present

## 2023-02-05 DIAGNOSIS — R011 Cardiac murmur, unspecified: Secondary | ICD-10-CM | POA: Diagnosis not present

## 2023-02-06 DIAGNOSIS — R600 Localized edema: Secondary | ICD-10-CM | POA: Diagnosis not present

## 2023-02-06 DIAGNOSIS — R011 Cardiac murmur, unspecified: Secondary | ICD-10-CM | POA: Diagnosis not present

## 2023-02-06 DIAGNOSIS — M48062 Spinal stenosis, lumbar region with neurogenic claudication: Secondary | ICD-10-CM | POA: Diagnosis not present

## 2023-02-06 DIAGNOSIS — M1990 Unspecified osteoarthritis, unspecified site: Secondary | ICD-10-CM | POA: Diagnosis not present

## 2023-02-06 DIAGNOSIS — M5416 Radiculopathy, lumbar region: Secondary | ICD-10-CM | POA: Diagnosis not present

## 2023-02-06 DIAGNOSIS — I119 Hypertensive heart disease without heart failure: Secondary | ICD-10-CM | POA: Diagnosis not present

## 2023-02-06 DIAGNOSIS — Z6836 Body mass index (BMI) 36.0-36.9, adult: Secondary | ICD-10-CM | POA: Diagnosis not present

## 2023-02-06 DIAGNOSIS — K5790 Diverticulosis of intestine, part unspecified, without perforation or abscess without bleeding: Secondary | ICD-10-CM | POA: Diagnosis not present

## 2023-02-07 DIAGNOSIS — M48062 Spinal stenosis, lumbar region with neurogenic claudication: Secondary | ICD-10-CM | POA: Diagnosis not present

## 2023-02-07 DIAGNOSIS — R011 Cardiac murmur, unspecified: Secondary | ICD-10-CM | POA: Diagnosis not present

## 2023-02-07 DIAGNOSIS — M1990 Unspecified osteoarthritis, unspecified site: Secondary | ICD-10-CM | POA: Diagnosis not present

## 2023-02-07 DIAGNOSIS — I119 Hypertensive heart disease without heart failure: Secondary | ICD-10-CM | POA: Diagnosis not present

## 2023-02-07 DIAGNOSIS — K5790 Diverticulosis of intestine, part unspecified, without perforation or abscess without bleeding: Secondary | ICD-10-CM | POA: Diagnosis not present

## 2023-02-07 DIAGNOSIS — R600 Localized edema: Secondary | ICD-10-CM | POA: Diagnosis not present

## 2023-02-10 DIAGNOSIS — M1990 Unspecified osteoarthritis, unspecified site: Secondary | ICD-10-CM | POA: Diagnosis not present

## 2023-02-10 DIAGNOSIS — R600 Localized edema: Secondary | ICD-10-CM | POA: Diagnosis not present

## 2023-02-10 DIAGNOSIS — I119 Hypertensive heart disease without heart failure: Secondary | ICD-10-CM | POA: Diagnosis not present

## 2023-02-10 DIAGNOSIS — K5790 Diverticulosis of intestine, part unspecified, without perforation or abscess without bleeding: Secondary | ICD-10-CM | POA: Diagnosis not present

## 2023-02-10 DIAGNOSIS — R011 Cardiac murmur, unspecified: Secondary | ICD-10-CM | POA: Diagnosis not present

## 2023-02-10 DIAGNOSIS — M48062 Spinal stenosis, lumbar region with neurogenic claudication: Secondary | ICD-10-CM | POA: Diagnosis not present

## 2023-02-12 DIAGNOSIS — M48062 Spinal stenosis, lumbar region with neurogenic claudication: Secondary | ICD-10-CM | POA: Diagnosis not present

## 2023-02-12 DIAGNOSIS — R600 Localized edema: Secondary | ICD-10-CM | POA: Diagnosis not present

## 2023-02-12 DIAGNOSIS — K5790 Diverticulosis of intestine, part unspecified, without perforation or abscess without bleeding: Secondary | ICD-10-CM | POA: Diagnosis not present

## 2023-02-12 DIAGNOSIS — M1990 Unspecified osteoarthritis, unspecified site: Secondary | ICD-10-CM | POA: Diagnosis not present

## 2023-02-12 DIAGNOSIS — I119 Hypertensive heart disease without heart failure: Secondary | ICD-10-CM | POA: Diagnosis not present

## 2023-02-12 DIAGNOSIS — R011 Cardiac murmur, unspecified: Secondary | ICD-10-CM | POA: Diagnosis not present

## 2023-02-13 DIAGNOSIS — M48062 Spinal stenosis, lumbar region with neurogenic claudication: Secondary | ICD-10-CM | POA: Diagnosis not present

## 2023-02-13 DIAGNOSIS — K5790 Diverticulosis of intestine, part unspecified, without perforation or abscess without bleeding: Secondary | ICD-10-CM | POA: Diagnosis not present

## 2023-02-13 DIAGNOSIS — I119 Hypertensive heart disease without heart failure: Secondary | ICD-10-CM | POA: Diagnosis not present

## 2023-02-13 DIAGNOSIS — R600 Localized edema: Secondary | ICD-10-CM | POA: Diagnosis not present

## 2023-02-13 DIAGNOSIS — R011 Cardiac murmur, unspecified: Secondary | ICD-10-CM | POA: Diagnosis not present

## 2023-02-13 DIAGNOSIS — M1990 Unspecified osteoarthritis, unspecified site: Secondary | ICD-10-CM | POA: Diagnosis not present

## 2023-02-14 DIAGNOSIS — I119 Hypertensive heart disease without heart failure: Secondary | ICD-10-CM | POA: Diagnosis not present

## 2023-02-14 DIAGNOSIS — R600 Localized edema: Secondary | ICD-10-CM | POA: Diagnosis not present

## 2023-02-14 DIAGNOSIS — K5790 Diverticulosis of intestine, part unspecified, without perforation or abscess without bleeding: Secondary | ICD-10-CM | POA: Diagnosis not present

## 2023-02-14 DIAGNOSIS — R011 Cardiac murmur, unspecified: Secondary | ICD-10-CM | POA: Diagnosis not present

## 2023-02-14 DIAGNOSIS — M1990 Unspecified osteoarthritis, unspecified site: Secondary | ICD-10-CM | POA: Diagnosis not present

## 2023-02-14 DIAGNOSIS — M48062 Spinal stenosis, lumbar region with neurogenic claudication: Secondary | ICD-10-CM | POA: Diagnosis not present

## 2023-02-17 DIAGNOSIS — R011 Cardiac murmur, unspecified: Secondary | ICD-10-CM | POA: Diagnosis not present

## 2023-02-17 DIAGNOSIS — M1990 Unspecified osteoarthritis, unspecified site: Secondary | ICD-10-CM | POA: Diagnosis not present

## 2023-02-17 DIAGNOSIS — I119 Hypertensive heart disease without heart failure: Secondary | ICD-10-CM | POA: Diagnosis not present

## 2023-02-17 DIAGNOSIS — R600 Localized edema: Secondary | ICD-10-CM | POA: Diagnosis not present

## 2023-02-17 DIAGNOSIS — M48062 Spinal stenosis, lumbar region with neurogenic claudication: Secondary | ICD-10-CM | POA: Diagnosis not present

## 2023-02-17 DIAGNOSIS — K5790 Diverticulosis of intestine, part unspecified, without perforation or abscess without bleeding: Secondary | ICD-10-CM | POA: Diagnosis not present

## 2023-02-18 DIAGNOSIS — Z0000 Encounter for general adult medical examination without abnormal findings: Secondary | ICD-10-CM | POA: Diagnosis not present

## 2023-02-18 DIAGNOSIS — Z1331 Encounter for screening for depression: Secondary | ICD-10-CM | POA: Diagnosis not present

## 2023-02-19 DIAGNOSIS — I119 Hypertensive heart disease without heart failure: Secondary | ICD-10-CM | POA: Diagnosis not present

## 2023-02-19 DIAGNOSIS — M1990 Unspecified osteoarthritis, unspecified site: Secondary | ICD-10-CM | POA: Diagnosis not present

## 2023-02-19 DIAGNOSIS — M48062 Spinal stenosis, lumbar region with neurogenic claudication: Secondary | ICD-10-CM | POA: Diagnosis not present

## 2023-02-19 DIAGNOSIS — R600 Localized edema: Secondary | ICD-10-CM | POA: Diagnosis not present

## 2023-02-19 DIAGNOSIS — R011 Cardiac murmur, unspecified: Secondary | ICD-10-CM | POA: Diagnosis not present

## 2023-02-19 DIAGNOSIS — K5790 Diverticulosis of intestine, part unspecified, without perforation or abscess without bleeding: Secondary | ICD-10-CM | POA: Diagnosis not present

## 2023-02-20 DIAGNOSIS — F32A Depression, unspecified: Secondary | ICD-10-CM | POA: Diagnosis not present

## 2023-02-20 DIAGNOSIS — Z8701 Personal history of pneumonia (recurrent): Secondary | ICD-10-CM | POA: Diagnosis not present

## 2023-02-20 DIAGNOSIS — Z6836 Body mass index (BMI) 36.0-36.9, adult: Secondary | ICD-10-CM | POA: Diagnosis not present

## 2023-02-20 DIAGNOSIS — E1142 Type 2 diabetes mellitus with diabetic polyneuropathy: Secondary | ICD-10-CM | POA: Diagnosis not present

## 2023-02-20 DIAGNOSIS — R011 Cardiac murmur, unspecified: Secondary | ICD-10-CM | POA: Diagnosis not present

## 2023-02-20 DIAGNOSIS — Z515 Encounter for palliative care: Secondary | ICD-10-CM | POA: Diagnosis not present

## 2023-02-20 DIAGNOSIS — M48062 Spinal stenosis, lumbar region with neurogenic claudication: Secondary | ICD-10-CM | POA: Diagnosis not present

## 2023-02-20 DIAGNOSIS — H353133 Nonexudative age-related macular degeneration, bilateral, advanced atrophic without subfoveal involvement: Secondary | ICD-10-CM | POA: Diagnosis not present

## 2023-02-20 DIAGNOSIS — J302 Other seasonal allergic rhinitis: Secondary | ICD-10-CM | POA: Diagnosis not present

## 2023-02-20 DIAGNOSIS — M810 Age-related osteoporosis without current pathological fracture: Secondary | ICD-10-CM | POA: Diagnosis not present

## 2023-02-20 DIAGNOSIS — I350 Nonrheumatic aortic (valve) stenosis: Secondary | ICD-10-CM | POA: Diagnosis not present

## 2023-02-20 DIAGNOSIS — M1990 Unspecified osteoarthritis, unspecified site: Secondary | ICD-10-CM | POA: Diagnosis not present

## 2023-02-20 DIAGNOSIS — I119 Hypertensive heart disease without heart failure: Secondary | ICD-10-CM | POA: Diagnosis not present

## 2023-02-20 DIAGNOSIS — K5790 Diverticulosis of intestine, part unspecified, without perforation or abscess without bleeding: Secondary | ICD-10-CM | POA: Diagnosis not present

## 2023-02-20 DIAGNOSIS — E559 Vitamin D deficiency, unspecified: Secondary | ICD-10-CM | POA: Diagnosis not present

## 2023-02-20 DIAGNOSIS — E782 Mixed hyperlipidemia: Secondary | ICD-10-CM | POA: Diagnosis not present

## 2023-02-20 DIAGNOSIS — E1169 Type 2 diabetes mellitus with other specified complication: Secondary | ICD-10-CM | POA: Diagnosis not present

## 2023-02-20 DIAGNOSIS — F411 Generalized anxiety disorder: Secondary | ICD-10-CM | POA: Diagnosis not present

## 2023-02-20 DIAGNOSIS — E0520 Thyrotoxicosis with toxic multinodular goiter without thyrotoxic crisis or storm: Secondary | ICD-10-CM | POA: Diagnosis not present

## 2023-02-20 DIAGNOSIS — E0590 Thyrotoxicosis, unspecified without thyrotoxic crisis or storm: Secondary | ICD-10-CM | POA: Diagnosis not present

## 2023-02-20 DIAGNOSIS — Z86018 Personal history of other benign neoplasm: Secondary | ICD-10-CM | POA: Diagnosis not present

## 2023-02-20 DIAGNOSIS — Z923 Personal history of irradiation: Secondary | ICD-10-CM | POA: Diagnosis not present

## 2023-02-20 DIAGNOSIS — R350 Frequency of micturition: Secondary | ICD-10-CM | POA: Diagnosis not present

## 2023-02-20 DIAGNOSIS — K219 Gastro-esophageal reflux disease without esophagitis: Secondary | ICD-10-CM | POA: Diagnosis not present

## 2023-02-20 DIAGNOSIS — H04123 Dry eye syndrome of bilateral lacrimal glands: Secondary | ICD-10-CM | POA: Diagnosis not present

## 2023-02-20 DIAGNOSIS — I10 Essential (primary) hypertension: Secondary | ICD-10-CM | POA: Diagnosis not present

## 2023-02-20 DIAGNOSIS — R600 Localized edema: Secondary | ICD-10-CM | POA: Diagnosis not present

## 2023-02-20 DIAGNOSIS — H903 Sensorineural hearing loss, bilateral: Secondary | ICD-10-CM | POA: Diagnosis not present

## 2023-02-21 DIAGNOSIS — M1711 Unilateral primary osteoarthritis, right knee: Secondary | ICD-10-CM | POA: Diagnosis not present

## 2023-02-21 DIAGNOSIS — M170 Bilateral primary osteoarthritis of knee: Secondary | ICD-10-CM | POA: Diagnosis not present

## 2023-02-24 DIAGNOSIS — K5790 Diverticulosis of intestine, part unspecified, without perforation or abscess without bleeding: Secondary | ICD-10-CM | POA: Diagnosis not present

## 2023-02-24 DIAGNOSIS — R600 Localized edema: Secondary | ICD-10-CM | POA: Diagnosis not present

## 2023-02-24 DIAGNOSIS — M1990 Unspecified osteoarthritis, unspecified site: Secondary | ICD-10-CM | POA: Diagnosis not present

## 2023-02-24 DIAGNOSIS — M48062 Spinal stenosis, lumbar region with neurogenic claudication: Secondary | ICD-10-CM | POA: Diagnosis not present

## 2023-02-24 DIAGNOSIS — R011 Cardiac murmur, unspecified: Secondary | ICD-10-CM | POA: Diagnosis not present

## 2023-02-24 DIAGNOSIS — I119 Hypertensive heart disease without heart failure: Secondary | ICD-10-CM | POA: Diagnosis not present

## 2023-02-26 DIAGNOSIS — I119 Hypertensive heart disease without heart failure: Secondary | ICD-10-CM | POA: Diagnosis not present

## 2023-02-26 DIAGNOSIS — M1990 Unspecified osteoarthritis, unspecified site: Secondary | ICD-10-CM | POA: Diagnosis not present

## 2023-02-26 DIAGNOSIS — R600 Localized edema: Secondary | ICD-10-CM | POA: Diagnosis not present

## 2023-02-26 DIAGNOSIS — K5790 Diverticulosis of intestine, part unspecified, without perforation or abscess without bleeding: Secondary | ICD-10-CM | POA: Diagnosis not present

## 2023-02-26 DIAGNOSIS — R011 Cardiac murmur, unspecified: Secondary | ICD-10-CM | POA: Diagnosis not present

## 2023-02-26 DIAGNOSIS — M48062 Spinal stenosis, lumbar region with neurogenic claudication: Secondary | ICD-10-CM | POA: Diagnosis not present

## 2023-02-27 DIAGNOSIS — M1990 Unspecified osteoarthritis, unspecified site: Secondary | ICD-10-CM | POA: Diagnosis not present

## 2023-02-27 DIAGNOSIS — K5790 Diverticulosis of intestine, part unspecified, without perforation or abscess without bleeding: Secondary | ICD-10-CM | POA: Diagnosis not present

## 2023-02-27 DIAGNOSIS — I119 Hypertensive heart disease without heart failure: Secondary | ICD-10-CM | POA: Diagnosis not present

## 2023-02-27 DIAGNOSIS — R011 Cardiac murmur, unspecified: Secondary | ICD-10-CM | POA: Diagnosis not present

## 2023-02-27 DIAGNOSIS — M48062 Spinal stenosis, lumbar region with neurogenic claudication: Secondary | ICD-10-CM | POA: Diagnosis not present

## 2023-02-27 DIAGNOSIS — R600 Localized edema: Secondary | ICD-10-CM | POA: Diagnosis not present

## 2023-02-28 DIAGNOSIS — M48062 Spinal stenosis, lumbar region with neurogenic claudication: Secondary | ICD-10-CM | POA: Diagnosis not present

## 2023-02-28 DIAGNOSIS — I119 Hypertensive heart disease without heart failure: Secondary | ICD-10-CM | POA: Diagnosis not present

## 2023-02-28 DIAGNOSIS — R600 Localized edema: Secondary | ICD-10-CM | POA: Diagnosis not present

## 2023-02-28 DIAGNOSIS — K5790 Diverticulosis of intestine, part unspecified, without perforation or abscess without bleeding: Secondary | ICD-10-CM | POA: Diagnosis not present

## 2023-02-28 DIAGNOSIS — M1990 Unspecified osteoarthritis, unspecified site: Secondary | ICD-10-CM | POA: Diagnosis not present

## 2023-02-28 DIAGNOSIS — R011 Cardiac murmur, unspecified: Secondary | ICD-10-CM | POA: Diagnosis not present

## 2023-03-03 ENCOUNTER — Telehealth: Payer: Self-pay | Admitting: Cardiology

## 2023-03-03 NOTE — Telephone Encounter (Signed)
  Pt c/o swelling/edema: STAT if pt has developed SOB within 24 hours  If swelling, where is the swelling located? Knees through feet  How much weight have you gained and in what time span?   Have you gained 2 pounds in a day or 5 pounds in a week? At least 4-5 lbs in a weeks  Do you have a log of your daily weights (if so, list)?   Are you currently taking a fluid pill? Yes   Are you currently SOB? No   Have you traveled recently in a car or plane for an extended period of time? No   The patient expressed concern about possible fluid retention. She noted swelling from her knees down to her feet and believes she has gained 4-5 pounds within the past week.

## 2023-03-03 NOTE — Telephone Encounter (Signed)
Spoke with pt who reports she has been having some swelling at her ankles for awhile now.  She denies "much" of a weight gain currently.  Denies any SOB.  Reports her PCP advised Meloxicam  can cause swelling.  She has an appt upcoming with the rheumatologist.  I advises, sometimes amlodipine can also cause edema.  She is aware she will need to be evaluated for this in the office.  Offered pt appt on Wednesday with Dr Anne Fu at our [place] location.  She is scheduled for 3 pm.  She has to check with the person who drives her to see if her can bring her to this appt.  She will call back if unable to keep it.  She remains on the schedule for 8/28 with Dr Anne Fu at [facility].  She will call back with which appt she needs to cancel.

## 2023-03-05 ENCOUNTER — Encounter (HOSPITAL_BASED_OUTPATIENT_CLINIC_OR_DEPARTMENT_OTHER): Payer: Self-pay | Admitting: Cardiology

## 2023-03-05 ENCOUNTER — Ambulatory Visit (INDEPENDENT_AMBULATORY_CARE_PROVIDER_SITE_OTHER): Payer: Medicare Other | Admitting: Cardiology

## 2023-03-05 VITALS — BP 135/72 | HR 66 | Ht 60.0 in | Wt 189.5 lb

## 2023-03-05 DIAGNOSIS — I350 Nonrheumatic aortic (valve) stenosis: Secondary | ICD-10-CM | POA: Diagnosis not present

## 2023-03-05 DIAGNOSIS — L4050 Arthropathic psoriasis, unspecified: Secondary | ICD-10-CM

## 2023-03-05 DIAGNOSIS — M1990 Unspecified osteoarthritis, unspecified site: Secondary | ICD-10-CM | POA: Diagnosis not present

## 2023-03-05 DIAGNOSIS — R600 Localized edema: Secondary | ICD-10-CM | POA: Diagnosis not present

## 2023-03-05 DIAGNOSIS — I10 Essential (primary) hypertension: Secondary | ICD-10-CM

## 2023-03-05 DIAGNOSIS — K5790 Diverticulosis of intestine, part unspecified, without perforation or abscess without bleeding: Secondary | ICD-10-CM | POA: Diagnosis not present

## 2023-03-05 DIAGNOSIS — R011 Cardiac murmur, unspecified: Secondary | ICD-10-CM | POA: Diagnosis not present

## 2023-03-05 DIAGNOSIS — I119 Hypertensive heart disease without heart failure: Secondary | ICD-10-CM | POA: Diagnosis not present

## 2023-03-05 DIAGNOSIS — M48062 Spinal stenosis, lumbar region with neurogenic claudication: Secondary | ICD-10-CM | POA: Diagnosis not present

## 2023-03-05 MED ORDER — POTASSIUM CHLORIDE ER 10 MEQ PO TBCR: 10.0000 meq | EXTENDED_RELEASE_TABLET | Freq: Every day | ORAL | 3 refills | Status: DC

## 2023-03-05 MED ORDER — FUROSEMIDE 20 MG PO TABS: 20.0000 mg | ORAL_TABLET | Freq: Every day | ORAL | 3 refills | Status: AC

## 2023-03-05 NOTE — Patient Instructions (Signed)
Medication Instructions:  Your physician has recommended you make the following change in your medication:  1) START taking Lasix (furosemide) 20 mg daily  2) START taking potassium chloride 10 meq daily  *If you need a refill on your cardiac medications before your next appointment, please call your pharmacy*   Lab Work: IN TWO WEEKS: BMET  The lab is located on the 3rd floor - the lab is open Monday-Friday 8:00am-4:30pm and is closed from 12:00pm-1:00pm for lunch.   Follow-Up: At [facility], you and your health needs are our priority.  As part of our continuing mission to provide you with exceptional heart care, we have created designated Provider Care Teams.  These Care Teams include your primary Cardiologist (physician) and Advanced Practice Providers (APPs -  Physician Assistants and Nurse Practitioners) who all work together to provide you with the care you need, when you need it.  Your next appointment:   6 months  Provider:   Edd Fabian, NP or Gillian Shields, NP

## 2023-03-05 NOTE — Progress Notes (Signed)
  Cardiology Office Note:  .   Date:  03/05/2023  ID:  TEMPESTT Lisa [name], DOB 03-21-1936, MRN [id number] PCP: Eloisa Northern, MD  [facility] Providers Cardiologist:  Donato Schultz, MD    History of Present Illness: .    Discussed the use of AI scribe software for clinical note transcription with the patient, who gave verbal consent to proceed.  History of Present Illness   The patient, an 87 year old with a history of aortic stenosis, hypertension, psoriatic arthritis, osteoporosis, and a history of radioactive iodine ablation for an unspecified condition, presents for a follow-up visit. She reports that her back surgery went well and that her arthritis and osteoporosis are the main sources of pain. She has noticed an improvement in the swelling in her lower legs and feet, which she attributes to keeping her legs elevated and reducing her salt intake. Despite these measures, the swelling was significant enough that a nurse from palliative care, who has been visiting her weekly since her surgery, ordered Lasix and potassium. The patient has not started these medications yet, as she wanted to discuss it with me first. She also mentions that she is on meloxicam for arthritis pain, which she found out can cause swelling. She has concerns about the potential for increased urination with the Lasix, as she already goes to the bathroom frequently.       ROS: No fevers chills nausea vomiting  Studies Reviewed: [name] Kitchen        LABS Creatinine: 0.6 Potassium: 4.0  DIAGNOSTIC Echocardiogram: Stable aortic stenosis, mild to moderate. Mean gradient 15 mmHg. (08/22/2022) Risk Assessment/Calculations:            Physical Exam:   VS:  BP 135/72 (BP Location: Left Arm, Patient Position: Sitting, Cuff Size: Large)   Pulse 66   Ht 5' (1.524 m)   Wt 189 lb 8 oz (86 kg)   LMP  (LMP Unknown)   SpO2 95%   BMI 37.01 kg/m    Wt Readings from Last 3 Encounters:  03/05/23 189 lb 8 oz (86 kg)  12/24/22 189 lb 6.4 oz  (85.9 kg)  10/14/22 188 lb (85.3 kg)    GEN: Well nourished, well developed in no acute distress NECK: No JVD; No carotid bruits CARDIAC: RRR, 2/6 SM, rubs, gallops RESPIRATORY:  Clear to auscultation without rales, wheezing or rhonchi  ABDOMEN: Soft, non-tender, non-distended EXTREMITIES:  mild LE edema; No deformity   ASSESSMENT AND PLAN: .   Assessment and Plan    Lower extremity edema Improved with elevation and reduced salt intake. Discussed the potential benefits and risks of adding Lasix and potassium supplement. -Start Lasix 20mg  daily and potassium daily. -Check Basic Metabolic Panel (BMP) in 2 weeks to monitor kidney function and potassium levels.  Aortic Stenosis Stable, mild to moderate severity based on recent echocardiogram (08/22/22). -Continue current management and monitoring.  Hypertension Managed with current medications, including amlodipine which may contribute to lower extremity edema. -Continue current management and monitoring.  Follow-up in 6 months with an Systems developer (APP).            Dispo: 2 weeks BMET, 6 mth APP  Signed, Donato Schultz, MD

## 2023-03-06 DIAGNOSIS — I119 Hypertensive heart disease without heart failure: Secondary | ICD-10-CM | POA: Diagnosis not present

## 2023-03-06 DIAGNOSIS — M1990 Unspecified osteoarthritis, unspecified site: Secondary | ICD-10-CM | POA: Diagnosis not present

## 2023-03-06 DIAGNOSIS — K5790 Diverticulosis of intestine, part unspecified, without perforation or abscess without bleeding: Secondary | ICD-10-CM | POA: Diagnosis not present

## 2023-03-06 DIAGNOSIS — R011 Cardiac murmur, unspecified: Secondary | ICD-10-CM | POA: Diagnosis not present

## 2023-03-06 DIAGNOSIS — R600 Localized edema: Secondary | ICD-10-CM | POA: Diagnosis not present

## 2023-03-06 DIAGNOSIS — M48062 Spinal stenosis, lumbar region with neurogenic claudication: Secondary | ICD-10-CM | POA: Diagnosis not present

## 2023-03-07 DIAGNOSIS — I119 Hypertensive heart disease without heart failure: Secondary | ICD-10-CM | POA: Diagnosis not present

## 2023-03-07 DIAGNOSIS — R600 Localized edema: Secondary | ICD-10-CM | POA: Diagnosis not present

## 2023-03-07 DIAGNOSIS — M1990 Unspecified osteoarthritis, unspecified site: Secondary | ICD-10-CM | POA: Diagnosis not present

## 2023-03-07 DIAGNOSIS — M48062 Spinal stenosis, lumbar region with neurogenic claudication: Secondary | ICD-10-CM | POA: Diagnosis not present

## 2023-03-07 DIAGNOSIS — K5790 Diverticulosis of intestine, part unspecified, without perforation or abscess without bleeding: Secondary | ICD-10-CM | POA: Diagnosis not present

## 2023-03-07 DIAGNOSIS — R011 Cardiac murmur, unspecified: Secondary | ICD-10-CM | POA: Diagnosis not present

## 2023-03-10 DIAGNOSIS — I119 Hypertensive heart disease without heart failure: Secondary | ICD-10-CM | POA: Diagnosis not present

## 2023-03-10 DIAGNOSIS — M48062 Spinal stenosis, lumbar region with neurogenic claudication: Secondary | ICD-10-CM | POA: Diagnosis not present

## 2023-03-10 DIAGNOSIS — R600 Localized edema: Secondary | ICD-10-CM | POA: Diagnosis not present

## 2023-03-10 DIAGNOSIS — M1990 Unspecified osteoarthritis, unspecified site: Secondary | ICD-10-CM | POA: Diagnosis not present

## 2023-03-10 DIAGNOSIS — K5790 Diverticulosis of intestine, part unspecified, without perforation or abscess without bleeding: Secondary | ICD-10-CM | POA: Diagnosis not present

## 2023-03-10 DIAGNOSIS — R011 Cardiac murmur, unspecified: Secondary | ICD-10-CM | POA: Diagnosis not present

## 2023-03-11 DIAGNOSIS — K5790 Diverticulosis of intestine, part unspecified, without perforation or abscess without bleeding: Secondary | ICD-10-CM | POA: Diagnosis not present

## 2023-03-11 DIAGNOSIS — M48062 Spinal stenosis, lumbar region with neurogenic claudication: Secondary | ICD-10-CM | POA: Diagnosis not present

## 2023-03-11 DIAGNOSIS — R011 Cardiac murmur, unspecified: Secondary | ICD-10-CM | POA: Diagnosis not present

## 2023-03-11 DIAGNOSIS — I119 Hypertensive heart disease without heart failure: Secondary | ICD-10-CM | POA: Diagnosis not present

## 2023-03-11 DIAGNOSIS — R600 Localized edema: Secondary | ICD-10-CM | POA: Diagnosis not present

## 2023-03-11 DIAGNOSIS — M1990 Unspecified osteoarthritis, unspecified site: Secondary | ICD-10-CM | POA: Diagnosis not present

## 2023-03-12 DIAGNOSIS — R011 Cardiac murmur, unspecified: Secondary | ICD-10-CM | POA: Diagnosis not present

## 2023-03-12 DIAGNOSIS — K5790 Diverticulosis of intestine, part unspecified, without perforation or abscess without bleeding: Secondary | ICD-10-CM | POA: Diagnosis not present

## 2023-03-12 DIAGNOSIS — M48062 Spinal stenosis, lumbar region with neurogenic claudication: Secondary | ICD-10-CM | POA: Diagnosis not present

## 2023-03-12 DIAGNOSIS — R600 Localized edema: Secondary | ICD-10-CM | POA: Diagnosis not present

## 2023-03-12 DIAGNOSIS — M1990 Unspecified osteoarthritis, unspecified site: Secondary | ICD-10-CM | POA: Diagnosis not present

## 2023-03-12 DIAGNOSIS — I119 Hypertensive heart disease without heart failure: Secondary | ICD-10-CM | POA: Diagnosis not present

## 2023-03-14 ENCOUNTER — Encounter: Payer: Self-pay | Admitting: Podiatry

## 2023-03-14 ENCOUNTER — Ambulatory Visit: Payer: Medicare Other | Admitting: Podiatry

## 2023-03-14 DIAGNOSIS — B351 Tinea unguium: Secondary | ICD-10-CM | POA: Diagnosis not present

## 2023-03-14 DIAGNOSIS — M79675 Pain in left toe(s): Secondary | ICD-10-CM

## 2023-03-14 DIAGNOSIS — E119 Type 2 diabetes mellitus without complications: Secondary | ICD-10-CM | POA: Diagnosis not present

## 2023-03-14 DIAGNOSIS — M79674 Pain in right toe(s): Secondary | ICD-10-CM | POA: Diagnosis not present

## 2023-03-14 NOTE — Progress Notes (Signed)
This patient returns to my office for at risk foot care.  This patient requires this care by a professional since this patient will be at risk due to having diabetes.  This patient is unable to cut nails herself since the patient cannot reach her nails.These nails are painful walking and wearing shoes.  .  This patient presents for at risk foot care today.  General Appearance  Alert, conversant and in no acute stress.  Vascular  Dorsalis pedis and posterior tibial  pulses are palpable  bilaterally.  Capillary return is within normal limits  bilaterally. Temperature is within normal limits  bilaterally.  Neurologic  Senn-Weinstein monofilament wire test within normal limits  bilaterally. Muscle power within normal limits bilaterally.  Nails Thick disfigured discolored nails with subungual debris  Hallux nails  B/L.[name] Kitchen No evidence of bacterial infection or drainage bilaterally.  Orthopedic  No limitations of motion  feet .  No crepitus or effusions noted.  No bony pathology or digital deformities noted. ADV 4th toe right foot.  HAV  B/L>  Skin  normotropic skin with no porokeratosis noted bilaterally.  No signs of infections or ulcers noted.     Onychomycosis  Pain in right toes  Pain in left toes  Consent was obtained for treatment procedures.   Mechanical debridement of nails 1-5  bilaterally performed with a nail nipper.  Filed with dremel without incident.    Return office visit  3 months                  Told patient to return for periodic foot care and evaluation due to potential at risk complications.   Helane Gunther DPM

## 2023-03-17 DIAGNOSIS — M1990 Unspecified osteoarthritis, unspecified site: Secondary | ICD-10-CM | POA: Diagnosis not present

## 2023-03-17 DIAGNOSIS — R600 Localized edema: Secondary | ICD-10-CM | POA: Diagnosis not present

## 2023-03-17 DIAGNOSIS — R011 Cardiac murmur, unspecified: Secondary | ICD-10-CM | POA: Diagnosis not present

## 2023-03-17 DIAGNOSIS — I119 Hypertensive heart disease without heart failure: Secondary | ICD-10-CM | POA: Diagnosis not present

## 2023-03-17 DIAGNOSIS — M48062 Spinal stenosis, lumbar region with neurogenic claudication: Secondary | ICD-10-CM | POA: Diagnosis not present

## 2023-03-17 DIAGNOSIS — K5790 Diverticulosis of intestine, part unspecified, without perforation or abscess without bleeding: Secondary | ICD-10-CM | POA: Diagnosis not present

## 2023-03-18 DIAGNOSIS — M1990 Unspecified osteoarthritis, unspecified site: Secondary | ICD-10-CM | POA: Diagnosis not present

## 2023-03-18 DIAGNOSIS — M1991 Primary osteoarthritis, unspecified site: Secondary | ICD-10-CM | POA: Diagnosis not present

## 2023-03-18 DIAGNOSIS — R011 Cardiac murmur, unspecified: Secondary | ICD-10-CM | POA: Diagnosis not present

## 2023-03-18 DIAGNOSIS — M810 Age-related osteoporosis without current pathological fracture: Secondary | ICD-10-CM | POA: Diagnosis not present

## 2023-03-18 DIAGNOSIS — M48062 Spinal stenosis, lumbar region with neurogenic claudication: Secondary | ICD-10-CM | POA: Diagnosis not present

## 2023-03-18 DIAGNOSIS — E669 Obesity, unspecified: Secondary | ICD-10-CM | POA: Diagnosis not present

## 2023-03-18 DIAGNOSIS — L4059 Other psoriatic arthropathy: Secondary | ICD-10-CM | POA: Diagnosis not present

## 2023-03-18 DIAGNOSIS — K5790 Diverticulosis of intestine, part unspecified, without perforation or abscess without bleeding: Secondary | ICD-10-CM | POA: Diagnosis not present

## 2023-03-18 DIAGNOSIS — M5136 Other intervertebral disc degeneration, lumbar region: Secondary | ICD-10-CM | POA: Diagnosis not present

## 2023-03-18 DIAGNOSIS — R5382 Chronic fatigue, unspecified: Secondary | ICD-10-CM | POA: Diagnosis not present

## 2023-03-18 DIAGNOSIS — R600 Localized edema: Secondary | ICD-10-CM | POA: Diagnosis not present

## 2023-03-18 DIAGNOSIS — I119 Hypertensive heart disease without heart failure: Secondary | ICD-10-CM | POA: Diagnosis not present

## 2023-03-18 DIAGNOSIS — E041 Nontoxic single thyroid nodule: Secondary | ICD-10-CM | POA: Diagnosis not present

## 2023-03-18 DIAGNOSIS — Z6836 Body mass index (BMI) 36.0-36.9, adult: Secondary | ICD-10-CM | POA: Diagnosis not present

## 2023-03-18 DIAGNOSIS — R7989 Other specified abnormal findings of blood chemistry: Secondary | ICD-10-CM | POA: Diagnosis not present

## 2023-03-19 ENCOUNTER — Ambulatory Visit: Payer: Medicare Other | Admitting: Cardiology

## 2023-03-19 DIAGNOSIS — R600 Localized edema: Secondary | ICD-10-CM | POA: Diagnosis not present

## 2023-03-19 DIAGNOSIS — I119 Hypertensive heart disease without heart failure: Secondary | ICD-10-CM | POA: Diagnosis not present

## 2023-03-19 DIAGNOSIS — R011 Cardiac murmur, unspecified: Secondary | ICD-10-CM | POA: Diagnosis not present

## 2023-03-19 DIAGNOSIS — M1990 Unspecified osteoarthritis, unspecified site: Secondary | ICD-10-CM | POA: Diagnosis not present

## 2023-03-19 DIAGNOSIS — M48062 Spinal stenosis, lumbar region with neurogenic claudication: Secondary | ICD-10-CM | POA: Diagnosis not present

## 2023-03-19 DIAGNOSIS — K5790 Diverticulosis of intestine, part unspecified, without perforation or abscess without bleeding: Secondary | ICD-10-CM | POA: Diagnosis not present

## 2023-03-20 DIAGNOSIS — I10 Essential (primary) hypertension: Secondary | ICD-10-CM | POA: Diagnosis not present

## 2023-03-20 DIAGNOSIS — I350 Nonrheumatic aortic (valve) stenosis: Secondary | ICD-10-CM | POA: Diagnosis not present

## 2023-03-20 DIAGNOSIS — L4050 Arthropathic psoriasis, unspecified: Secondary | ICD-10-CM | POA: Diagnosis not present

## 2023-03-21 DIAGNOSIS — I119 Hypertensive heart disease without heart failure: Secondary | ICD-10-CM | POA: Diagnosis not present

## 2023-03-21 DIAGNOSIS — R600 Localized edema: Secondary | ICD-10-CM | POA: Diagnosis not present

## 2023-03-21 DIAGNOSIS — M1990 Unspecified osteoarthritis, unspecified site: Secondary | ICD-10-CM | POA: Diagnosis not present

## 2023-03-21 DIAGNOSIS — M48062 Spinal stenosis, lumbar region with neurogenic claudication: Secondary | ICD-10-CM | POA: Diagnosis not present

## 2023-03-21 DIAGNOSIS — R011 Cardiac murmur, unspecified: Secondary | ICD-10-CM | POA: Diagnosis not present

## 2023-03-21 DIAGNOSIS — K5790 Diverticulosis of intestine, part unspecified, without perforation or abscess without bleeding: Secondary | ICD-10-CM | POA: Diagnosis not present

## 2023-03-21 LAB — BASIC METABOLIC PANEL
BUN/Creatinine Ratio: 33 — ABNORMAL HIGH (ref 12–28)
BUN: 24 mg/dL (ref 8–27)
CO2: 23 mmol/L (ref 20–29)
Calcium: 10.1 mg/dL (ref 8.7–10.3)
Chloride: 98 mmol/L (ref 96–106)
Creatinine, Ser: 0.73 mg/dL (ref 0.57–1.00)
Glucose: 101 mg/dL — ABNORMAL HIGH (ref 70–99)
Potassium: 4.5 mmol/L (ref 3.5–5.2)
Sodium: 139 mmol/L (ref 134–144)
eGFR: 81 mL/min/{1.73_m2} (ref >59)

## 2023-03-23 DIAGNOSIS — Z86018 Personal history of other benign neoplasm: Secondary | ICD-10-CM | POA: Diagnosis not present

## 2023-03-23 DIAGNOSIS — Z8701 Personal history of pneumonia (recurrent): Secondary | ICD-10-CM | POA: Diagnosis not present

## 2023-03-23 DIAGNOSIS — K219 Gastro-esophageal reflux disease without esophagitis: Secondary | ICD-10-CM | POA: Diagnosis not present

## 2023-03-23 DIAGNOSIS — Z515 Encounter for palliative care: Secondary | ICD-10-CM | POA: Diagnosis not present

## 2023-03-23 DIAGNOSIS — R011 Cardiac murmur, unspecified: Secondary | ICD-10-CM | POA: Diagnosis not present

## 2023-03-23 DIAGNOSIS — K5790 Diverticulosis of intestine, part unspecified, without perforation or abscess without bleeding: Secondary | ICD-10-CM | POA: Diagnosis not present

## 2023-03-23 DIAGNOSIS — E1142 Type 2 diabetes mellitus with diabetic polyneuropathy: Secondary | ICD-10-CM | POA: Diagnosis not present

## 2023-03-23 DIAGNOSIS — H353133 Nonexudative age-related macular degeneration, bilateral, advanced atrophic without subfoveal involvement: Secondary | ICD-10-CM | POA: Diagnosis not present

## 2023-03-23 DIAGNOSIS — F411 Generalized anxiety disorder: Secondary | ICD-10-CM | POA: Diagnosis not present

## 2023-03-23 DIAGNOSIS — E782 Mixed hyperlipidemia: Secondary | ICD-10-CM | POA: Diagnosis not present

## 2023-03-23 DIAGNOSIS — R600 Localized edema: Secondary | ICD-10-CM | POA: Diagnosis not present

## 2023-03-23 DIAGNOSIS — M1990 Unspecified osteoarthritis, unspecified site: Secondary | ICD-10-CM | POA: Diagnosis not present

## 2023-03-23 DIAGNOSIS — I350 Nonrheumatic aortic (valve) stenosis: Secondary | ICD-10-CM | POA: Diagnosis not present

## 2023-03-23 DIAGNOSIS — F32A Depression, unspecified: Secondary | ICD-10-CM | POA: Diagnosis not present

## 2023-03-23 DIAGNOSIS — E0590 Thyrotoxicosis, unspecified without thyrotoxic crisis or storm: Secondary | ICD-10-CM | POA: Diagnosis not present

## 2023-03-23 DIAGNOSIS — I119 Hypertensive heart disease without heart failure: Secondary | ICD-10-CM | POA: Diagnosis not present

## 2023-03-23 DIAGNOSIS — M48062 Spinal stenosis, lumbar region with neurogenic claudication: Secondary | ICD-10-CM | POA: Diagnosis not present

## 2023-03-23 DIAGNOSIS — H903 Sensorineural hearing loss, bilateral: Secondary | ICD-10-CM | POA: Diagnosis not present

## 2023-03-25 ENCOUNTER — Telehealth: Payer: Self-pay | Admitting: Cardiology

## 2023-03-25 NOTE — Telephone Encounter (Signed)
Shared lab results with patient.  Per Dr. Anne Fu: Excellent labs after starting lasix 20mg  and potassium 10 meq Continue  Patient verbalized understanding and expressed appreciation for call.

## 2023-03-25 NOTE — Telephone Encounter (Signed)
Pt called for lab results 

## 2023-03-26 DIAGNOSIS — I119 Hypertensive heart disease without heart failure: Secondary | ICD-10-CM | POA: Diagnosis not present

## 2023-03-26 DIAGNOSIS — M1990 Unspecified osteoarthritis, unspecified site: Secondary | ICD-10-CM | POA: Diagnosis not present

## 2023-03-26 DIAGNOSIS — M48062 Spinal stenosis, lumbar region with neurogenic claudication: Secondary | ICD-10-CM | POA: Diagnosis not present

## 2023-03-26 DIAGNOSIS — R600 Localized edema: Secondary | ICD-10-CM | POA: Diagnosis not present

## 2023-03-26 DIAGNOSIS — K5790 Diverticulosis of intestine, part unspecified, without perforation or abscess without bleeding: Secondary | ICD-10-CM | POA: Diagnosis not present

## 2023-03-26 DIAGNOSIS — R011 Cardiac murmur, unspecified: Secondary | ICD-10-CM | POA: Diagnosis not present

## 2023-03-28 DIAGNOSIS — R011 Cardiac murmur, unspecified: Secondary | ICD-10-CM | POA: Diagnosis not present

## 2023-03-28 DIAGNOSIS — M48062 Spinal stenosis, lumbar region with neurogenic claudication: Secondary | ICD-10-CM | POA: Diagnosis not present

## 2023-03-28 DIAGNOSIS — I119 Hypertensive heart disease without heart failure: Secondary | ICD-10-CM | POA: Diagnosis not present

## 2023-03-28 DIAGNOSIS — K5790 Diverticulosis of intestine, part unspecified, without perforation or abscess without bleeding: Secondary | ICD-10-CM | POA: Diagnosis not present

## 2023-03-28 DIAGNOSIS — M1990 Unspecified osteoarthritis, unspecified site: Secondary | ICD-10-CM | POA: Diagnosis not present

## 2023-03-28 DIAGNOSIS — R600 Localized edema: Secondary | ICD-10-CM | POA: Diagnosis not present

## 2023-03-31 DIAGNOSIS — M1990 Unspecified osteoarthritis, unspecified site: Secondary | ICD-10-CM | POA: Diagnosis not present

## 2023-03-31 DIAGNOSIS — M48062 Spinal stenosis, lumbar region with neurogenic claudication: Secondary | ICD-10-CM | POA: Diagnosis not present

## 2023-03-31 DIAGNOSIS — R600 Localized edema: Secondary | ICD-10-CM | POA: Diagnosis not present

## 2023-03-31 DIAGNOSIS — R011 Cardiac murmur, unspecified: Secondary | ICD-10-CM | POA: Diagnosis not present

## 2023-03-31 DIAGNOSIS — K5790 Diverticulosis of intestine, part unspecified, without perforation or abscess without bleeding: Secondary | ICD-10-CM | POA: Diagnosis not present

## 2023-03-31 DIAGNOSIS — I119 Hypertensive heart disease without heart failure: Secondary | ICD-10-CM | POA: Diagnosis not present

## 2023-04-01 DIAGNOSIS — M48062 Spinal stenosis, lumbar region with neurogenic claudication: Secondary | ICD-10-CM | POA: Diagnosis not present

## 2023-04-01 DIAGNOSIS — R011 Cardiac murmur, unspecified: Secondary | ICD-10-CM | POA: Diagnosis not present

## 2023-04-01 DIAGNOSIS — M1990 Unspecified osteoarthritis, unspecified site: Secondary | ICD-10-CM | POA: Diagnosis not present

## 2023-04-01 DIAGNOSIS — R600 Localized edema: Secondary | ICD-10-CM | POA: Diagnosis not present

## 2023-04-01 DIAGNOSIS — I119 Hypertensive heart disease without heart failure: Secondary | ICD-10-CM | POA: Diagnosis not present

## 2023-04-01 DIAGNOSIS — K5790 Diverticulosis of intestine, part unspecified, without perforation or abscess without bleeding: Secondary | ICD-10-CM | POA: Diagnosis not present

## 2023-04-02 DIAGNOSIS — M1990 Unspecified osteoarthritis, unspecified site: Secondary | ICD-10-CM | POA: Diagnosis not present

## 2023-04-02 DIAGNOSIS — R600 Localized edema: Secondary | ICD-10-CM | POA: Diagnosis not present

## 2023-04-02 DIAGNOSIS — R011 Cardiac murmur, unspecified: Secondary | ICD-10-CM | POA: Diagnosis not present

## 2023-04-02 DIAGNOSIS — M48062 Spinal stenosis, lumbar region with neurogenic claudication: Secondary | ICD-10-CM | POA: Diagnosis not present

## 2023-04-02 DIAGNOSIS — I119 Hypertensive heart disease without heart failure: Secondary | ICD-10-CM | POA: Diagnosis not present

## 2023-04-02 DIAGNOSIS — K5790 Diverticulosis of intestine, part unspecified, without perforation or abscess without bleeding: Secondary | ICD-10-CM | POA: Diagnosis not present

## 2023-04-03 DIAGNOSIS — M48062 Spinal stenosis, lumbar region with neurogenic claudication: Secondary | ICD-10-CM | POA: Diagnosis not present

## 2023-04-03 DIAGNOSIS — M1990 Unspecified osteoarthritis, unspecified site: Secondary | ICD-10-CM | POA: Diagnosis not present

## 2023-04-03 DIAGNOSIS — R011 Cardiac murmur, unspecified: Secondary | ICD-10-CM | POA: Diagnosis not present

## 2023-04-03 DIAGNOSIS — I119 Hypertensive heart disease without heart failure: Secondary | ICD-10-CM | POA: Diagnosis not present

## 2023-04-03 DIAGNOSIS — K5790 Diverticulosis of intestine, part unspecified, without perforation or abscess without bleeding: Secondary | ICD-10-CM | POA: Diagnosis not present

## 2023-04-03 DIAGNOSIS — R600 Localized edema: Secondary | ICD-10-CM | POA: Diagnosis not present

## 2023-04-04 DIAGNOSIS — R600 Localized edema: Secondary | ICD-10-CM | POA: Diagnosis not present

## 2023-04-04 DIAGNOSIS — I119 Hypertensive heart disease without heart failure: Secondary | ICD-10-CM | POA: Diagnosis not present

## 2023-04-04 DIAGNOSIS — R011 Cardiac murmur, unspecified: Secondary | ICD-10-CM | POA: Diagnosis not present

## 2023-04-04 DIAGNOSIS — M1990 Unspecified osteoarthritis, unspecified site: Secondary | ICD-10-CM | POA: Diagnosis not present

## 2023-04-04 DIAGNOSIS — K5790 Diverticulosis of intestine, part unspecified, without perforation or abscess without bleeding: Secondary | ICD-10-CM | POA: Diagnosis not present

## 2023-04-04 DIAGNOSIS — M48062 Spinal stenosis, lumbar region with neurogenic claudication: Secondary | ICD-10-CM | POA: Diagnosis not present

## 2023-04-07 DIAGNOSIS — M48062 Spinal stenosis, lumbar region with neurogenic claudication: Secondary | ICD-10-CM | POA: Diagnosis not present

## 2023-04-07 DIAGNOSIS — K5790 Diverticulosis of intestine, part unspecified, without perforation or abscess without bleeding: Secondary | ICD-10-CM | POA: Diagnosis not present

## 2023-04-07 DIAGNOSIS — M1990 Unspecified osteoarthritis, unspecified site: Secondary | ICD-10-CM | POA: Diagnosis not present

## 2023-04-07 DIAGNOSIS — I119 Hypertensive heart disease without heart failure: Secondary | ICD-10-CM | POA: Diagnosis not present

## 2023-04-07 DIAGNOSIS — R600 Localized edema: Secondary | ICD-10-CM | POA: Diagnosis not present

## 2023-04-07 DIAGNOSIS — R011 Cardiac murmur, unspecified: Secondary | ICD-10-CM | POA: Diagnosis not present

## 2023-04-08 DIAGNOSIS — K5790 Diverticulosis of intestine, part unspecified, without perforation or abscess without bleeding: Secondary | ICD-10-CM | POA: Diagnosis not present

## 2023-04-08 DIAGNOSIS — R600 Localized edema: Secondary | ICD-10-CM | POA: Diagnosis not present

## 2023-04-08 DIAGNOSIS — I119 Hypertensive heart disease without heart failure: Secondary | ICD-10-CM | POA: Diagnosis not present

## 2023-04-08 DIAGNOSIS — M48062 Spinal stenosis, lumbar region with neurogenic claudication: Secondary | ICD-10-CM | POA: Diagnosis not present

## 2023-04-08 DIAGNOSIS — R011 Cardiac murmur, unspecified: Secondary | ICD-10-CM | POA: Diagnosis not present

## 2023-04-08 DIAGNOSIS — M1990 Unspecified osteoarthritis, unspecified site: Secondary | ICD-10-CM | POA: Diagnosis not present

## 2023-04-09 DIAGNOSIS — R011 Cardiac murmur, unspecified: Secondary | ICD-10-CM | POA: Diagnosis not present

## 2023-04-09 DIAGNOSIS — M48062 Spinal stenosis, lumbar region with neurogenic claudication: Secondary | ICD-10-CM | POA: Diagnosis not present

## 2023-04-09 DIAGNOSIS — M1990 Unspecified osteoarthritis, unspecified site: Secondary | ICD-10-CM | POA: Diagnosis not present

## 2023-04-09 DIAGNOSIS — I119 Hypertensive heart disease without heart failure: Secondary | ICD-10-CM | POA: Diagnosis not present

## 2023-04-09 DIAGNOSIS — K5790 Diverticulosis of intestine, part unspecified, without perforation or abscess without bleeding: Secondary | ICD-10-CM | POA: Diagnosis not present

## 2023-04-09 DIAGNOSIS — R600 Localized edema: Secondary | ICD-10-CM | POA: Diagnosis not present

## 2023-04-10 ENCOUNTER — Encounter (INDEPENDENT_AMBULATORY_CARE_PROVIDER_SITE_OTHER): Payer: Medicare Other | Admitting: Ophthalmology

## 2023-04-11 DIAGNOSIS — Z923 Personal history of irradiation: Secondary | ICD-10-CM | POA: Insufficient documentation

## 2023-04-11 DIAGNOSIS — H903 Sensorineural hearing loss, bilateral: Secondary | ICD-10-CM | POA: Diagnosis not present

## 2023-04-11 DIAGNOSIS — H90A22 Sensorineural hearing loss, unilateral, left ear, with restricted hearing on the contralateral side: Secondary | ICD-10-CM | POA: Diagnosis not present

## 2023-04-11 DIAGNOSIS — D447 Neoplasm of uncertain behavior of aortic body and other paraganglia: Secondary | ICD-10-CM | POA: Diagnosis not present

## 2023-04-11 DIAGNOSIS — E1165 Type 2 diabetes mellitus with hyperglycemia: Secondary | ICD-10-CM | POA: Diagnosis not present

## 2023-04-11 DIAGNOSIS — Z79899 Other long term (current) drug therapy: Secondary | ICD-10-CM | POA: Diagnosis not present

## 2023-04-11 DIAGNOSIS — Z0110 Encounter for examination of ears and hearing without abnormal findings: Secondary | ICD-10-CM | POA: Diagnosis not present

## 2023-04-14 DIAGNOSIS — M48062 Spinal stenosis, lumbar region with neurogenic claudication: Secondary | ICD-10-CM | POA: Diagnosis not present

## 2023-04-14 DIAGNOSIS — K5790 Diverticulosis of intestine, part unspecified, without perforation or abscess without bleeding: Secondary | ICD-10-CM | POA: Diagnosis not present

## 2023-04-14 DIAGNOSIS — R011 Cardiac murmur, unspecified: Secondary | ICD-10-CM | POA: Diagnosis not present

## 2023-04-14 DIAGNOSIS — H353123 Nonexudative age-related macular degeneration, left eye, advanced atrophic without subfoveal involvement: Secondary | ICD-10-CM | POA: Diagnosis not present

## 2023-04-14 DIAGNOSIS — H43813 Vitreous degeneration, bilateral: Secondary | ICD-10-CM | POA: Diagnosis not present

## 2023-04-14 DIAGNOSIS — I119 Hypertensive heart disease without heart failure: Secondary | ICD-10-CM | POA: Diagnosis not present

## 2023-04-14 DIAGNOSIS — H43811 Vitreous degeneration, right eye: Secondary | ICD-10-CM | POA: Diagnosis not present

## 2023-04-14 DIAGNOSIS — R600 Localized edema: Secondary | ICD-10-CM | POA: Diagnosis not present

## 2023-04-14 DIAGNOSIS — M1990 Unspecified osteoarthritis, unspecified site: Secondary | ICD-10-CM | POA: Diagnosis not present

## 2023-04-14 DIAGNOSIS — H43812 Vitreous degeneration, left eye: Secondary | ICD-10-CM | POA: Diagnosis not present

## 2023-04-14 DIAGNOSIS — H353113 Nonexudative age-related macular degeneration, right eye, advanced atrophic without subfoveal involvement: Secondary | ICD-10-CM | POA: Diagnosis not present

## 2023-04-15 DIAGNOSIS — R011 Cardiac murmur, unspecified: Secondary | ICD-10-CM | POA: Diagnosis not present

## 2023-04-15 DIAGNOSIS — I119 Hypertensive heart disease without heart failure: Secondary | ICD-10-CM | POA: Diagnosis not present

## 2023-04-15 DIAGNOSIS — M48062 Spinal stenosis, lumbar region with neurogenic claudication: Secondary | ICD-10-CM | POA: Diagnosis not present

## 2023-04-15 DIAGNOSIS — R600 Localized edema: Secondary | ICD-10-CM | POA: Diagnosis not present

## 2023-04-15 DIAGNOSIS — M1990 Unspecified osteoarthritis, unspecified site: Secondary | ICD-10-CM | POA: Diagnosis not present

## 2023-04-15 DIAGNOSIS — K5790 Diverticulosis of intestine, part unspecified, without perforation or abscess without bleeding: Secondary | ICD-10-CM | POA: Diagnosis not present

## 2023-04-16 DIAGNOSIS — R600 Localized edema: Secondary | ICD-10-CM | POA: Diagnosis not present

## 2023-04-16 DIAGNOSIS — K5790 Diverticulosis of intestine, part unspecified, without perforation or abscess without bleeding: Secondary | ICD-10-CM | POA: Diagnosis not present

## 2023-04-16 DIAGNOSIS — R011 Cardiac murmur, unspecified: Secondary | ICD-10-CM | POA: Diagnosis not present

## 2023-04-16 DIAGNOSIS — M1990 Unspecified osteoarthritis, unspecified site: Secondary | ICD-10-CM | POA: Diagnosis not present

## 2023-04-16 DIAGNOSIS — M48062 Spinal stenosis, lumbar region with neurogenic claudication: Secondary | ICD-10-CM | POA: Diagnosis not present

## 2023-04-16 DIAGNOSIS — I119 Hypertensive heart disease without heart failure: Secondary | ICD-10-CM | POA: Diagnosis not present

## 2023-04-21 DIAGNOSIS — I119 Hypertensive heart disease without heart failure: Secondary | ICD-10-CM | POA: Diagnosis not present

## 2023-04-21 DIAGNOSIS — R011 Cardiac murmur, unspecified: Secondary | ICD-10-CM | POA: Diagnosis not present

## 2023-04-21 DIAGNOSIS — M1990 Unspecified osteoarthritis, unspecified site: Secondary | ICD-10-CM | POA: Diagnosis not present

## 2023-04-21 DIAGNOSIS — M48062 Spinal stenosis, lumbar region with neurogenic claudication: Secondary | ICD-10-CM | POA: Diagnosis not present

## 2023-04-21 DIAGNOSIS — R600 Localized edema: Secondary | ICD-10-CM | POA: Diagnosis not present

## 2023-04-21 DIAGNOSIS — K5790 Diverticulosis of intestine, part unspecified, without perforation or abscess without bleeding: Secondary | ICD-10-CM | POA: Diagnosis not present

## 2023-04-22 DIAGNOSIS — M1990 Unspecified osteoarthritis, unspecified site: Secondary | ICD-10-CM | POA: Diagnosis not present

## 2023-04-22 DIAGNOSIS — M48062 Spinal stenosis, lumbar region with neurogenic claudication: Secondary | ICD-10-CM | POA: Diagnosis not present

## 2023-04-22 DIAGNOSIS — K5790 Diverticulosis of intestine, part unspecified, without perforation or abscess without bleeding: Secondary | ICD-10-CM | POA: Diagnosis not present

## 2023-04-22 DIAGNOSIS — E0590 Thyrotoxicosis, unspecified without thyrotoxic crisis or storm: Secondary | ICD-10-CM | POA: Diagnosis not present

## 2023-04-22 DIAGNOSIS — K219 Gastro-esophageal reflux disease without esophagitis: Secondary | ICD-10-CM | POA: Diagnosis not present

## 2023-04-22 DIAGNOSIS — F32A Depression, unspecified: Secondary | ICD-10-CM | POA: Diagnosis not present

## 2023-04-22 DIAGNOSIS — Z8701 Personal history of pneumonia (recurrent): Secondary | ICD-10-CM | POA: Diagnosis not present

## 2023-04-22 DIAGNOSIS — E782 Mixed hyperlipidemia: Secondary | ICD-10-CM | POA: Diagnosis not present

## 2023-04-22 DIAGNOSIS — I350 Nonrheumatic aortic (valve) stenosis: Secondary | ICD-10-CM | POA: Diagnosis not present

## 2023-04-22 DIAGNOSIS — Z86018 Personal history of other benign neoplasm: Secondary | ICD-10-CM | POA: Diagnosis not present

## 2023-04-22 DIAGNOSIS — Z515 Encounter for palliative care: Secondary | ICD-10-CM | POA: Diagnosis not present

## 2023-04-22 DIAGNOSIS — H903 Sensorineural hearing loss, bilateral: Secondary | ICD-10-CM | POA: Diagnosis not present

## 2023-04-22 DIAGNOSIS — R011 Cardiac murmur, unspecified: Secondary | ICD-10-CM | POA: Diagnosis not present

## 2023-04-22 DIAGNOSIS — R600 Localized edema: Secondary | ICD-10-CM | POA: Diagnosis not present

## 2023-04-22 DIAGNOSIS — E1142 Type 2 diabetes mellitus with diabetic polyneuropathy: Secondary | ICD-10-CM | POA: Diagnosis not present

## 2023-04-22 DIAGNOSIS — F411 Generalized anxiety disorder: Secondary | ICD-10-CM | POA: Diagnosis not present

## 2023-04-22 DIAGNOSIS — I119 Hypertensive heart disease without heart failure: Secondary | ICD-10-CM | POA: Diagnosis not present

## 2023-04-22 DIAGNOSIS — H353133 Nonexudative age-related macular degeneration, bilateral, advanced atrophic without subfoveal involvement: Secondary | ICD-10-CM | POA: Diagnosis not present

## 2023-04-23 DIAGNOSIS — K5790 Diverticulosis of intestine, part unspecified, without perforation or abscess without bleeding: Secondary | ICD-10-CM | POA: Diagnosis not present

## 2023-04-23 DIAGNOSIS — R011 Cardiac murmur, unspecified: Secondary | ICD-10-CM | POA: Diagnosis not present

## 2023-04-23 DIAGNOSIS — M48062 Spinal stenosis, lumbar region with neurogenic claudication: Secondary | ICD-10-CM | POA: Diagnosis not present

## 2023-04-23 DIAGNOSIS — R600 Localized edema: Secondary | ICD-10-CM | POA: Diagnosis not present

## 2023-04-23 DIAGNOSIS — M1990 Unspecified osteoarthritis, unspecified site: Secondary | ICD-10-CM | POA: Diagnosis not present

## 2023-04-23 DIAGNOSIS — I119 Hypertensive heart disease without heart failure: Secondary | ICD-10-CM | POA: Diagnosis not present

## 2023-04-25 DIAGNOSIS — I119 Hypertensive heart disease without heart failure: Secondary | ICD-10-CM | POA: Diagnosis not present

## 2023-04-25 DIAGNOSIS — M1990 Unspecified osteoarthritis, unspecified site: Secondary | ICD-10-CM | POA: Diagnosis not present

## 2023-04-25 DIAGNOSIS — K5790 Diverticulosis of intestine, part unspecified, without perforation or abscess without bleeding: Secondary | ICD-10-CM | POA: Diagnosis not present

## 2023-04-25 DIAGNOSIS — R011 Cardiac murmur, unspecified: Secondary | ICD-10-CM | POA: Diagnosis not present

## 2023-04-25 DIAGNOSIS — M48062 Spinal stenosis, lumbar region with neurogenic claudication: Secondary | ICD-10-CM | POA: Diagnosis not present

## 2023-04-25 DIAGNOSIS — R600 Localized edema: Secondary | ICD-10-CM | POA: Diagnosis not present

## 2023-04-28 DIAGNOSIS — K5790 Diverticulosis of intestine, part unspecified, without perforation or abscess without bleeding: Secondary | ICD-10-CM | POA: Diagnosis not present

## 2023-04-28 DIAGNOSIS — R600 Localized edema: Secondary | ICD-10-CM | POA: Diagnosis not present

## 2023-04-28 DIAGNOSIS — R011 Cardiac murmur, unspecified: Secondary | ICD-10-CM | POA: Diagnosis not present

## 2023-04-28 DIAGNOSIS — M1990 Unspecified osteoarthritis, unspecified site: Secondary | ICD-10-CM | POA: Diagnosis not present

## 2023-04-28 DIAGNOSIS — M48062 Spinal stenosis, lumbar region with neurogenic claudication: Secondary | ICD-10-CM | POA: Diagnosis not present

## 2023-04-28 DIAGNOSIS — I119 Hypertensive heart disease without heart failure: Secondary | ICD-10-CM | POA: Diagnosis not present

## 2023-04-29 DIAGNOSIS — R011 Cardiac murmur, unspecified: Secondary | ICD-10-CM | POA: Diagnosis not present

## 2023-04-29 DIAGNOSIS — I119 Hypertensive heart disease without heart failure: Secondary | ICD-10-CM | POA: Diagnosis not present

## 2023-04-29 DIAGNOSIS — M1990 Unspecified osteoarthritis, unspecified site: Secondary | ICD-10-CM | POA: Diagnosis not present

## 2023-04-29 DIAGNOSIS — M48062 Spinal stenosis, lumbar region with neurogenic claudication: Secondary | ICD-10-CM | POA: Diagnosis not present

## 2023-04-29 DIAGNOSIS — K5790 Diverticulosis of intestine, part unspecified, without perforation or abscess without bleeding: Secondary | ICD-10-CM | POA: Diagnosis not present

## 2023-04-29 DIAGNOSIS — R600 Localized edema: Secondary | ICD-10-CM | POA: Diagnosis not present

## 2023-04-30 DIAGNOSIS — I119 Hypertensive heart disease without heart failure: Secondary | ICD-10-CM | POA: Diagnosis not present

## 2023-04-30 DIAGNOSIS — R011 Cardiac murmur, unspecified: Secondary | ICD-10-CM | POA: Diagnosis not present

## 2023-04-30 DIAGNOSIS — R600 Localized edema: Secondary | ICD-10-CM | POA: Diagnosis not present

## 2023-04-30 DIAGNOSIS — M1990 Unspecified osteoarthritis, unspecified site: Secondary | ICD-10-CM | POA: Diagnosis not present

## 2023-04-30 DIAGNOSIS — M48062 Spinal stenosis, lumbar region with neurogenic claudication: Secondary | ICD-10-CM | POA: Diagnosis not present

## 2023-04-30 DIAGNOSIS — K5790 Diverticulosis of intestine, part unspecified, without perforation or abscess without bleeding: Secondary | ICD-10-CM | POA: Diagnosis not present

## 2023-05-05 DIAGNOSIS — I119 Hypertensive heart disease without heart failure: Secondary | ICD-10-CM | POA: Diagnosis not present

## 2023-05-05 DIAGNOSIS — M48062 Spinal stenosis, lumbar region with neurogenic claudication: Secondary | ICD-10-CM | POA: Diagnosis not present

## 2023-05-05 DIAGNOSIS — R011 Cardiac murmur, unspecified: Secondary | ICD-10-CM | POA: Diagnosis not present

## 2023-05-05 DIAGNOSIS — K5790 Diverticulosis of intestine, part unspecified, without perforation or abscess without bleeding: Secondary | ICD-10-CM | POA: Diagnosis not present

## 2023-05-05 DIAGNOSIS — M1990 Unspecified osteoarthritis, unspecified site: Secondary | ICD-10-CM | POA: Diagnosis not present

## 2023-05-05 DIAGNOSIS — R600 Localized edema: Secondary | ICD-10-CM | POA: Diagnosis not present

## 2023-05-06 DIAGNOSIS — M1990 Unspecified osteoarthritis, unspecified site: Secondary | ICD-10-CM | POA: Diagnosis not present

## 2023-05-06 DIAGNOSIS — M48062 Spinal stenosis, lumbar region with neurogenic claudication: Secondary | ICD-10-CM | POA: Diagnosis not present

## 2023-05-06 DIAGNOSIS — I119 Hypertensive heart disease without heart failure: Secondary | ICD-10-CM | POA: Diagnosis not present

## 2023-05-06 DIAGNOSIS — R011 Cardiac murmur, unspecified: Secondary | ICD-10-CM | POA: Diagnosis not present

## 2023-05-06 DIAGNOSIS — K5790 Diverticulosis of intestine, part unspecified, without perforation or abscess without bleeding: Secondary | ICD-10-CM | POA: Diagnosis not present

## 2023-05-06 DIAGNOSIS — R600 Localized edema: Secondary | ICD-10-CM | POA: Diagnosis not present

## 2023-05-07 DIAGNOSIS — R600 Localized edema: Secondary | ICD-10-CM | POA: Diagnosis not present

## 2023-05-07 DIAGNOSIS — M48062 Spinal stenosis, lumbar region with neurogenic claudication: Secondary | ICD-10-CM | POA: Diagnosis not present

## 2023-05-07 DIAGNOSIS — I119 Hypertensive heart disease without heart failure: Secondary | ICD-10-CM | POA: Diagnosis not present

## 2023-05-07 DIAGNOSIS — K5790 Diverticulosis of intestine, part unspecified, without perforation or abscess without bleeding: Secondary | ICD-10-CM | POA: Diagnosis not present

## 2023-05-07 DIAGNOSIS — M1990 Unspecified osteoarthritis, unspecified site: Secondary | ICD-10-CM | POA: Diagnosis not present

## 2023-05-07 DIAGNOSIS — R011 Cardiac murmur, unspecified: Secondary | ICD-10-CM | POA: Diagnosis not present

## 2023-05-08 DIAGNOSIS — R011 Cardiac murmur, unspecified: Secondary | ICD-10-CM | POA: Diagnosis not present

## 2023-05-08 DIAGNOSIS — K5790 Diverticulosis of intestine, part unspecified, without perforation or abscess without bleeding: Secondary | ICD-10-CM | POA: Diagnosis not present

## 2023-05-08 DIAGNOSIS — M48062 Spinal stenosis, lumbar region with neurogenic claudication: Secondary | ICD-10-CM | POA: Diagnosis not present

## 2023-05-08 DIAGNOSIS — M1990 Unspecified osteoarthritis, unspecified site: Secondary | ICD-10-CM | POA: Diagnosis not present

## 2023-05-08 DIAGNOSIS — I119 Hypertensive heart disease without heart failure: Secondary | ICD-10-CM | POA: Diagnosis not present

## 2023-05-08 DIAGNOSIS — R600 Localized edema: Secondary | ICD-10-CM | POA: Diagnosis not present

## 2023-05-10 DIAGNOSIS — Z1231 Encounter for screening mammogram for malignant neoplasm of breast: Secondary | ICD-10-CM | POA: Diagnosis not present

## 2023-05-12 DIAGNOSIS — M48062 Spinal stenosis, lumbar region with neurogenic claudication: Secondary | ICD-10-CM | POA: Diagnosis not present

## 2023-05-12 DIAGNOSIS — R600 Localized edema: Secondary | ICD-10-CM | POA: Diagnosis not present

## 2023-05-12 DIAGNOSIS — I119 Hypertensive heart disease without heart failure: Secondary | ICD-10-CM | POA: Diagnosis not present

## 2023-05-12 DIAGNOSIS — R011 Cardiac murmur, unspecified: Secondary | ICD-10-CM | POA: Diagnosis not present

## 2023-05-12 DIAGNOSIS — M1990 Unspecified osteoarthritis, unspecified site: Secondary | ICD-10-CM | POA: Diagnosis not present

## 2023-05-12 DIAGNOSIS — K5790 Diverticulosis of intestine, part unspecified, without perforation or abscess without bleeding: Secondary | ICD-10-CM | POA: Diagnosis not present

## 2023-05-13 DIAGNOSIS — M1991 Primary osteoarthritis, unspecified site: Secondary | ICD-10-CM | POA: Diagnosis not present

## 2023-05-13 DIAGNOSIS — L4059 Other psoriatic arthropathy: Secondary | ICD-10-CM | POA: Diagnosis not present

## 2023-05-13 DIAGNOSIS — R5382 Chronic fatigue, unspecified: Secondary | ICD-10-CM | POA: Diagnosis not present

## 2023-05-13 DIAGNOSIS — E669 Obesity, unspecified: Secondary | ICD-10-CM | POA: Diagnosis not present

## 2023-05-13 DIAGNOSIS — Z6836 Body mass index (BMI) 36.0-36.9, adult: Secondary | ICD-10-CM | POA: Diagnosis not present

## 2023-05-13 DIAGNOSIS — R600 Localized edema: Secondary | ICD-10-CM | POA: Diagnosis not present

## 2023-05-13 DIAGNOSIS — R7989 Other specified abnormal findings of blood chemistry: Secondary | ICD-10-CM | POA: Diagnosis not present

## 2023-05-13 DIAGNOSIS — M810 Age-related osteoporosis without current pathological fracture: Secondary | ICD-10-CM | POA: Diagnosis not present

## 2023-05-15 DIAGNOSIS — K5790 Diverticulosis of intestine, part unspecified, without perforation or abscess without bleeding: Secondary | ICD-10-CM | POA: Diagnosis not present

## 2023-05-15 DIAGNOSIS — I119 Hypertensive heart disease without heart failure: Secondary | ICD-10-CM | POA: Diagnosis not present

## 2023-05-15 DIAGNOSIS — M48062 Spinal stenosis, lumbar region with neurogenic claudication: Secondary | ICD-10-CM | POA: Diagnosis not present

## 2023-05-15 DIAGNOSIS — R600 Localized edema: Secondary | ICD-10-CM | POA: Diagnosis not present

## 2023-05-15 DIAGNOSIS — M1990 Unspecified osteoarthritis, unspecified site: Secondary | ICD-10-CM | POA: Diagnosis not present

## 2023-05-15 DIAGNOSIS — R011 Cardiac murmur, unspecified: Secondary | ICD-10-CM | POA: Diagnosis not present

## 2023-05-16 DIAGNOSIS — I119 Hypertensive heart disease without heart failure: Secondary | ICD-10-CM | POA: Diagnosis not present

## 2023-05-16 DIAGNOSIS — M48062 Spinal stenosis, lumbar region with neurogenic claudication: Secondary | ICD-10-CM | POA: Diagnosis not present

## 2023-05-16 DIAGNOSIS — M1990 Unspecified osteoarthritis, unspecified site: Secondary | ICD-10-CM | POA: Diagnosis not present

## 2023-05-16 DIAGNOSIS — R600 Localized edema: Secondary | ICD-10-CM | POA: Diagnosis not present

## 2023-05-16 DIAGNOSIS — R011 Cardiac murmur, unspecified: Secondary | ICD-10-CM | POA: Diagnosis not present

## 2023-05-16 DIAGNOSIS — K5790 Diverticulosis of intestine, part unspecified, without perforation or abscess without bleeding: Secondary | ICD-10-CM | POA: Diagnosis not present

## 2023-05-20 DIAGNOSIS — M48062 Spinal stenosis, lumbar region with neurogenic claudication: Secondary | ICD-10-CM | POA: Diagnosis not present

## 2023-05-20 DIAGNOSIS — I119 Hypertensive heart disease without heart failure: Secondary | ICD-10-CM | POA: Diagnosis not present

## 2023-05-20 DIAGNOSIS — M1990 Unspecified osteoarthritis, unspecified site: Secondary | ICD-10-CM | POA: Diagnosis not present

## 2023-05-20 DIAGNOSIS — R011 Cardiac murmur, unspecified: Secondary | ICD-10-CM | POA: Diagnosis not present

## 2023-05-20 DIAGNOSIS — R600 Localized edema: Secondary | ICD-10-CM | POA: Diagnosis not present

## 2023-05-20 DIAGNOSIS — K5790 Diverticulosis of intestine, part unspecified, without perforation or abscess without bleeding: Secondary | ICD-10-CM | POA: Diagnosis not present

## 2023-05-21 DIAGNOSIS — I119 Hypertensive heart disease without heart failure: Secondary | ICD-10-CM | POA: Diagnosis not present

## 2023-05-21 DIAGNOSIS — M1990 Unspecified osteoarthritis, unspecified site: Secondary | ICD-10-CM | POA: Diagnosis not present

## 2023-05-21 DIAGNOSIS — R011 Cardiac murmur, unspecified: Secondary | ICD-10-CM | POA: Diagnosis not present

## 2023-05-21 DIAGNOSIS — K5790 Diverticulosis of intestine, part unspecified, without perforation or abscess without bleeding: Secondary | ICD-10-CM | POA: Diagnosis not present

## 2023-05-21 DIAGNOSIS — M48062 Spinal stenosis, lumbar region with neurogenic claudication: Secondary | ICD-10-CM | POA: Diagnosis not present

## 2023-05-21 DIAGNOSIS — R600 Localized edema: Secondary | ICD-10-CM | POA: Diagnosis not present

## 2023-05-22 DIAGNOSIS — Z6836 Body mass index (BMI) 36.0-36.9, adult: Secondary | ICD-10-CM | POA: Diagnosis not present

## 2023-05-22 DIAGNOSIS — J3489 Other specified disorders of nose and nasal sinuses: Secondary | ICD-10-CM | POA: Diagnosis not present

## 2023-05-22 DIAGNOSIS — Z23 Encounter for immunization: Secondary | ICD-10-CM | POA: Diagnosis not present

## 2023-05-22 DIAGNOSIS — K219 Gastro-esophageal reflux disease without esophagitis: Secondary | ICD-10-CM | POA: Diagnosis not present

## 2023-05-23 DIAGNOSIS — F411 Generalized anxiety disorder: Secondary | ICD-10-CM | POA: Diagnosis not present

## 2023-05-23 DIAGNOSIS — R011 Cardiac murmur, unspecified: Secondary | ICD-10-CM | POA: Diagnosis not present

## 2023-05-23 DIAGNOSIS — K219 Gastro-esophageal reflux disease without esophagitis: Secondary | ICD-10-CM | POA: Diagnosis not present

## 2023-05-23 DIAGNOSIS — E782 Mixed hyperlipidemia: Secondary | ICD-10-CM | POA: Diagnosis not present

## 2023-05-23 DIAGNOSIS — M1990 Unspecified osteoarthritis, unspecified site: Secondary | ICD-10-CM | POA: Diagnosis not present

## 2023-05-23 DIAGNOSIS — Z86018 Personal history of other benign neoplasm: Secondary | ICD-10-CM | POA: Diagnosis not present

## 2023-05-23 DIAGNOSIS — K5790 Diverticulosis of intestine, part unspecified, without perforation or abscess without bleeding: Secondary | ICD-10-CM | POA: Diagnosis not present

## 2023-05-23 DIAGNOSIS — I350 Nonrheumatic aortic (valve) stenosis: Secondary | ICD-10-CM | POA: Diagnosis not present

## 2023-05-23 DIAGNOSIS — H903 Sensorineural hearing loss, bilateral: Secondary | ICD-10-CM | POA: Diagnosis not present

## 2023-05-23 DIAGNOSIS — Z8701 Personal history of pneumonia (recurrent): Secondary | ICD-10-CM | POA: Diagnosis not present

## 2023-05-23 DIAGNOSIS — H353133 Nonexudative age-related macular degeneration, bilateral, advanced atrophic without subfoveal involvement: Secondary | ICD-10-CM | POA: Diagnosis not present

## 2023-05-23 DIAGNOSIS — Z515 Encounter for palliative care: Secondary | ICD-10-CM | POA: Diagnosis not present

## 2023-05-23 DIAGNOSIS — E1142 Type 2 diabetes mellitus with diabetic polyneuropathy: Secondary | ICD-10-CM | POA: Diagnosis not present

## 2023-05-23 DIAGNOSIS — R600 Localized edema: Secondary | ICD-10-CM | POA: Diagnosis not present

## 2023-05-23 DIAGNOSIS — I119 Hypertensive heart disease without heart failure: Secondary | ICD-10-CM | POA: Diagnosis not present

## 2023-05-23 DIAGNOSIS — E0590 Thyrotoxicosis, unspecified without thyrotoxic crisis or storm: Secondary | ICD-10-CM | POA: Diagnosis not present

## 2023-05-23 DIAGNOSIS — M48062 Spinal stenosis, lumbar region with neurogenic claudication: Secondary | ICD-10-CM | POA: Diagnosis not present

## 2023-05-23 DIAGNOSIS — F32A Depression, unspecified: Secondary | ICD-10-CM | POA: Diagnosis not present

## 2023-05-26 DIAGNOSIS — H43813 Vitreous degeneration, bilateral: Secondary | ICD-10-CM | POA: Diagnosis not present

## 2023-05-26 DIAGNOSIS — H353123 Nonexudative age-related macular degeneration, left eye, advanced atrophic without subfoveal involvement: Secondary | ICD-10-CM | POA: Diagnosis not present

## 2023-05-26 DIAGNOSIS — H353113 Nonexudative age-related macular degeneration, right eye, advanced atrophic without subfoveal involvement: Secondary | ICD-10-CM | POA: Diagnosis not present

## 2023-05-27 DIAGNOSIS — K5790 Diverticulosis of intestine, part unspecified, without perforation or abscess without bleeding: Secondary | ICD-10-CM | POA: Diagnosis not present

## 2023-05-27 DIAGNOSIS — R600 Localized edema: Secondary | ICD-10-CM | POA: Diagnosis not present

## 2023-05-27 DIAGNOSIS — I119 Hypertensive heart disease without heart failure: Secondary | ICD-10-CM | POA: Diagnosis not present

## 2023-05-27 DIAGNOSIS — R011 Cardiac murmur, unspecified: Secondary | ICD-10-CM | POA: Diagnosis not present

## 2023-05-27 DIAGNOSIS — M1990 Unspecified osteoarthritis, unspecified site: Secondary | ICD-10-CM | POA: Diagnosis not present

## 2023-05-27 DIAGNOSIS — M48062 Spinal stenosis, lumbar region with neurogenic claudication: Secondary | ICD-10-CM | POA: Diagnosis not present

## 2023-05-28 DIAGNOSIS — M1990 Unspecified osteoarthritis, unspecified site: Secondary | ICD-10-CM | POA: Diagnosis not present

## 2023-05-28 DIAGNOSIS — I119 Hypertensive heart disease without heart failure: Secondary | ICD-10-CM | POA: Diagnosis not present

## 2023-05-28 DIAGNOSIS — R600 Localized edema: Secondary | ICD-10-CM | POA: Diagnosis not present

## 2023-05-28 DIAGNOSIS — R011 Cardiac murmur, unspecified: Secondary | ICD-10-CM | POA: Diagnosis not present

## 2023-05-28 DIAGNOSIS — K5790 Diverticulosis of intestine, part unspecified, without perforation or abscess without bleeding: Secondary | ICD-10-CM | POA: Diagnosis not present

## 2023-05-28 DIAGNOSIS — M48062 Spinal stenosis, lumbar region with neurogenic claudication: Secondary | ICD-10-CM | POA: Diagnosis not present

## 2023-06-01 DIAGNOSIS — M48062 Spinal stenosis, lumbar region with neurogenic claudication: Secondary | ICD-10-CM | POA: Diagnosis not present

## 2023-06-01 DIAGNOSIS — R600 Localized edema: Secondary | ICD-10-CM | POA: Diagnosis not present

## 2023-06-01 DIAGNOSIS — M1990 Unspecified osteoarthritis, unspecified site: Secondary | ICD-10-CM | POA: Diagnosis not present

## 2023-06-01 DIAGNOSIS — K5790 Diverticulosis of intestine, part unspecified, without perforation or abscess without bleeding: Secondary | ICD-10-CM | POA: Diagnosis not present

## 2023-06-01 DIAGNOSIS — I119 Hypertensive heart disease without heart failure: Secondary | ICD-10-CM | POA: Diagnosis not present

## 2023-06-01 DIAGNOSIS — R011 Cardiac murmur, unspecified: Secondary | ICD-10-CM | POA: Diagnosis not present

## 2023-06-02 DIAGNOSIS — R600 Localized edema: Secondary | ICD-10-CM | POA: Diagnosis not present

## 2023-06-02 DIAGNOSIS — K5790 Diverticulosis of intestine, part unspecified, without perforation or abscess without bleeding: Secondary | ICD-10-CM | POA: Diagnosis not present

## 2023-06-02 DIAGNOSIS — R011 Cardiac murmur, unspecified: Secondary | ICD-10-CM | POA: Diagnosis not present

## 2023-06-02 DIAGNOSIS — I119 Hypertensive heart disease without heart failure: Secondary | ICD-10-CM | POA: Diagnosis not present

## 2023-06-02 DIAGNOSIS — M1990 Unspecified osteoarthritis, unspecified site: Secondary | ICD-10-CM | POA: Diagnosis not present

## 2023-06-02 DIAGNOSIS — M48062 Spinal stenosis, lumbar region with neurogenic claudication: Secondary | ICD-10-CM | POA: Diagnosis not present

## 2023-06-03 DIAGNOSIS — I119 Hypertensive heart disease without heart failure: Secondary | ICD-10-CM | POA: Diagnosis not present

## 2023-06-03 DIAGNOSIS — K5790 Diverticulosis of intestine, part unspecified, without perforation or abscess without bleeding: Secondary | ICD-10-CM | POA: Diagnosis not present

## 2023-06-03 DIAGNOSIS — R011 Cardiac murmur, unspecified: Secondary | ICD-10-CM | POA: Diagnosis not present

## 2023-06-03 DIAGNOSIS — R600 Localized edema: Secondary | ICD-10-CM | POA: Diagnosis not present

## 2023-06-03 DIAGNOSIS — M48062 Spinal stenosis, lumbar region with neurogenic claudication: Secondary | ICD-10-CM | POA: Diagnosis not present

## 2023-06-03 DIAGNOSIS — M1990 Unspecified osteoarthritis, unspecified site: Secondary | ICD-10-CM | POA: Diagnosis not present

## 2023-06-04 DIAGNOSIS — M1990 Unspecified osteoarthritis, unspecified site: Secondary | ICD-10-CM | POA: Diagnosis not present

## 2023-06-04 DIAGNOSIS — R600 Localized edema: Secondary | ICD-10-CM | POA: Diagnosis not present

## 2023-06-04 DIAGNOSIS — R011 Cardiac murmur, unspecified: Secondary | ICD-10-CM | POA: Diagnosis not present

## 2023-06-04 DIAGNOSIS — I119 Hypertensive heart disease without heart failure: Secondary | ICD-10-CM | POA: Diagnosis not present

## 2023-06-04 DIAGNOSIS — M48062 Spinal stenosis, lumbar region with neurogenic claudication: Secondary | ICD-10-CM | POA: Diagnosis not present

## 2023-06-04 DIAGNOSIS — K5790 Diverticulosis of intestine, part unspecified, without perforation or abscess without bleeding: Secondary | ICD-10-CM | POA: Diagnosis not present

## 2023-06-06 DIAGNOSIS — R600 Localized edema: Secondary | ICD-10-CM | POA: Diagnosis not present

## 2023-06-06 DIAGNOSIS — M1990 Unspecified osteoarthritis, unspecified site: Secondary | ICD-10-CM | POA: Diagnosis not present

## 2023-06-06 DIAGNOSIS — M48062 Spinal stenosis, lumbar region with neurogenic claudication: Secondary | ICD-10-CM | POA: Diagnosis not present

## 2023-06-06 DIAGNOSIS — I119 Hypertensive heart disease without heart failure: Secondary | ICD-10-CM | POA: Diagnosis not present

## 2023-06-06 DIAGNOSIS — R011 Cardiac murmur, unspecified: Secondary | ICD-10-CM | POA: Diagnosis not present

## 2023-06-06 DIAGNOSIS — K5790 Diverticulosis of intestine, part unspecified, without perforation or abscess without bleeding: Secondary | ICD-10-CM | POA: Diagnosis not present

## 2023-06-09 DIAGNOSIS — M1990 Unspecified osteoarthritis, unspecified site: Secondary | ICD-10-CM | POA: Diagnosis not present

## 2023-06-09 DIAGNOSIS — R011 Cardiac murmur, unspecified: Secondary | ICD-10-CM | POA: Diagnosis not present

## 2023-06-09 DIAGNOSIS — M48062 Spinal stenosis, lumbar region with neurogenic claudication: Secondary | ICD-10-CM | POA: Diagnosis not present

## 2023-06-09 DIAGNOSIS — R600 Localized edema: Secondary | ICD-10-CM | POA: Diagnosis not present

## 2023-06-09 DIAGNOSIS — K5790 Diverticulosis of intestine, part unspecified, without perforation or abscess without bleeding: Secondary | ICD-10-CM | POA: Diagnosis not present

## 2023-06-09 DIAGNOSIS — I119 Hypertensive heart disease without heart failure: Secondary | ICD-10-CM | POA: Diagnosis not present

## 2023-06-10 DIAGNOSIS — R600 Localized edema: Secondary | ICD-10-CM | POA: Diagnosis not present

## 2023-06-10 DIAGNOSIS — M48062 Spinal stenosis, lumbar region with neurogenic claudication: Secondary | ICD-10-CM | POA: Diagnosis not present

## 2023-06-10 DIAGNOSIS — I119 Hypertensive heart disease without heart failure: Secondary | ICD-10-CM | POA: Diagnosis not present

## 2023-06-10 DIAGNOSIS — K5790 Diverticulosis of intestine, part unspecified, without perforation or abscess without bleeding: Secondary | ICD-10-CM | POA: Diagnosis not present

## 2023-06-10 DIAGNOSIS — R011 Cardiac murmur, unspecified: Secondary | ICD-10-CM | POA: Diagnosis not present

## 2023-06-10 DIAGNOSIS — M1990 Unspecified osteoarthritis, unspecified site: Secondary | ICD-10-CM | POA: Diagnosis not present

## 2023-06-11 DIAGNOSIS — R011 Cardiac murmur, unspecified: Secondary | ICD-10-CM | POA: Diagnosis not present

## 2023-06-11 DIAGNOSIS — M48062 Spinal stenosis, lumbar region with neurogenic claudication: Secondary | ICD-10-CM | POA: Diagnosis not present

## 2023-06-11 DIAGNOSIS — I119 Hypertensive heart disease without heart failure: Secondary | ICD-10-CM | POA: Diagnosis not present

## 2023-06-11 DIAGNOSIS — M1990 Unspecified osteoarthritis, unspecified site: Secondary | ICD-10-CM | POA: Diagnosis not present

## 2023-06-11 DIAGNOSIS — R600 Localized edema: Secondary | ICD-10-CM | POA: Diagnosis not present

## 2023-06-11 DIAGNOSIS — K5790 Diverticulosis of intestine, part unspecified, without perforation or abscess without bleeding: Secondary | ICD-10-CM | POA: Diagnosis not present

## 2023-06-14 DIAGNOSIS — I119 Hypertensive heart disease without heart failure: Secondary | ICD-10-CM | POA: Diagnosis not present

## 2023-06-14 DIAGNOSIS — R600 Localized edema: Secondary | ICD-10-CM | POA: Diagnosis not present

## 2023-06-14 DIAGNOSIS — M48062 Spinal stenosis, lumbar region with neurogenic claudication: Secondary | ICD-10-CM | POA: Diagnosis not present

## 2023-06-14 DIAGNOSIS — M1990 Unspecified osteoarthritis, unspecified site: Secondary | ICD-10-CM | POA: Diagnosis not present

## 2023-06-14 DIAGNOSIS — R011 Cardiac murmur, unspecified: Secondary | ICD-10-CM | POA: Diagnosis not present

## 2023-06-14 DIAGNOSIS — K5790 Diverticulosis of intestine, part unspecified, without perforation or abscess without bleeding: Secondary | ICD-10-CM | POA: Diagnosis not present

## 2023-06-16 DIAGNOSIS — I119 Hypertensive heart disease without heart failure: Secondary | ICD-10-CM | POA: Diagnosis not present

## 2023-06-16 DIAGNOSIS — K5790 Diverticulosis of intestine, part unspecified, without perforation or abscess without bleeding: Secondary | ICD-10-CM | POA: Diagnosis not present

## 2023-06-16 DIAGNOSIS — R011 Cardiac murmur, unspecified: Secondary | ICD-10-CM | POA: Diagnosis not present

## 2023-06-16 DIAGNOSIS — M1990 Unspecified osteoarthritis, unspecified site: Secondary | ICD-10-CM | POA: Diagnosis not present

## 2023-06-16 DIAGNOSIS — R600 Localized edema: Secondary | ICD-10-CM | POA: Diagnosis not present

## 2023-06-16 DIAGNOSIS — M48062 Spinal stenosis, lumbar region with neurogenic claudication: Secondary | ICD-10-CM | POA: Diagnosis not present

## 2023-06-17 DIAGNOSIS — M1990 Unspecified osteoarthritis, unspecified site: Secondary | ICD-10-CM | POA: Diagnosis not present

## 2023-06-17 DIAGNOSIS — R600 Localized edema: Secondary | ICD-10-CM | POA: Diagnosis not present

## 2023-06-17 DIAGNOSIS — K5790 Diverticulosis of intestine, part unspecified, without perforation or abscess without bleeding: Secondary | ICD-10-CM | POA: Diagnosis not present

## 2023-06-17 DIAGNOSIS — I119 Hypertensive heart disease without heart failure: Secondary | ICD-10-CM | POA: Diagnosis not present

## 2023-06-17 DIAGNOSIS — R011 Cardiac murmur, unspecified: Secondary | ICD-10-CM | POA: Diagnosis not present

## 2023-06-17 DIAGNOSIS — M48062 Spinal stenosis, lumbar region with neurogenic claudication: Secondary | ICD-10-CM | POA: Diagnosis not present

## 2023-06-18 DIAGNOSIS — M1990 Unspecified osteoarthritis, unspecified site: Secondary | ICD-10-CM | POA: Diagnosis not present

## 2023-06-18 DIAGNOSIS — M48062 Spinal stenosis, lumbar region with neurogenic claudication: Secondary | ICD-10-CM | POA: Diagnosis not present

## 2023-06-18 DIAGNOSIS — K5790 Diverticulosis of intestine, part unspecified, without perforation or abscess without bleeding: Secondary | ICD-10-CM | POA: Diagnosis not present

## 2023-06-18 DIAGNOSIS — R011 Cardiac murmur, unspecified: Secondary | ICD-10-CM | POA: Diagnosis not present

## 2023-06-18 DIAGNOSIS — I119 Hypertensive heart disease without heart failure: Secondary | ICD-10-CM | POA: Diagnosis not present

## 2023-06-18 DIAGNOSIS — R600 Localized edema: Secondary | ICD-10-CM | POA: Diagnosis not present

## 2023-06-22 DIAGNOSIS — I350 Nonrheumatic aortic (valve) stenosis: Secondary | ICD-10-CM | POA: Diagnosis not present

## 2023-06-22 DIAGNOSIS — E0590 Thyrotoxicosis, unspecified without thyrotoxic crisis or storm: Secondary | ICD-10-CM | POA: Diagnosis not present

## 2023-06-22 DIAGNOSIS — Z8701 Personal history of pneumonia (recurrent): Secondary | ICD-10-CM | POA: Diagnosis not present

## 2023-06-22 DIAGNOSIS — H903 Sensorineural hearing loss, bilateral: Secondary | ICD-10-CM | POA: Diagnosis not present

## 2023-06-22 DIAGNOSIS — I119 Hypertensive heart disease without heart failure: Secondary | ICD-10-CM | POA: Diagnosis not present

## 2023-06-22 DIAGNOSIS — Z86018 Personal history of other benign neoplasm: Secondary | ICD-10-CM | POA: Diagnosis not present

## 2023-06-22 DIAGNOSIS — M48062 Spinal stenosis, lumbar region with neurogenic claudication: Secondary | ICD-10-CM | POA: Diagnosis not present

## 2023-06-22 DIAGNOSIS — E782 Mixed hyperlipidemia: Secondary | ICD-10-CM | POA: Diagnosis not present

## 2023-06-22 DIAGNOSIS — K5790 Diverticulosis of intestine, part unspecified, without perforation or abscess without bleeding: Secondary | ICD-10-CM | POA: Diagnosis not present

## 2023-06-22 DIAGNOSIS — K219 Gastro-esophageal reflux disease without esophagitis: Secondary | ICD-10-CM | POA: Diagnosis not present

## 2023-06-22 DIAGNOSIS — M1990 Unspecified osteoarthritis, unspecified site: Secondary | ICD-10-CM | POA: Diagnosis not present

## 2023-06-22 DIAGNOSIS — H353133 Nonexudative age-related macular degeneration, bilateral, advanced atrophic without subfoveal involvement: Secondary | ICD-10-CM | POA: Diagnosis not present

## 2023-06-22 DIAGNOSIS — R600 Localized edema: Secondary | ICD-10-CM | POA: Diagnosis not present

## 2023-06-22 DIAGNOSIS — F411 Generalized anxiety disorder: Secondary | ICD-10-CM | POA: Diagnosis not present

## 2023-06-22 DIAGNOSIS — F32A Depression, unspecified: Secondary | ICD-10-CM | POA: Diagnosis not present

## 2023-06-22 DIAGNOSIS — Z515 Encounter for palliative care: Secondary | ICD-10-CM | POA: Diagnosis not present

## 2023-06-22 DIAGNOSIS — E1142 Type 2 diabetes mellitus with diabetic polyneuropathy: Secondary | ICD-10-CM | POA: Diagnosis not present

## 2023-06-22 DIAGNOSIS — R011 Cardiac murmur, unspecified: Secondary | ICD-10-CM | POA: Diagnosis not present

## 2023-06-23 ENCOUNTER — Ambulatory Visit: Payer: Medicare Other | Admitting: Podiatry

## 2023-06-23 DIAGNOSIS — M48062 Spinal stenosis, lumbar region with neurogenic claudication: Secondary | ICD-10-CM | POA: Diagnosis not present

## 2023-06-23 DIAGNOSIS — M1990 Unspecified osteoarthritis, unspecified site: Secondary | ICD-10-CM | POA: Diagnosis not present

## 2023-06-23 DIAGNOSIS — R600 Localized edema: Secondary | ICD-10-CM | POA: Diagnosis not present

## 2023-06-23 DIAGNOSIS — R011 Cardiac murmur, unspecified: Secondary | ICD-10-CM | POA: Diagnosis not present

## 2023-06-23 DIAGNOSIS — K5790 Diverticulosis of intestine, part unspecified, without perforation or abscess without bleeding: Secondary | ICD-10-CM | POA: Diagnosis not present

## 2023-06-23 DIAGNOSIS — I119 Hypertensive heart disease without heart failure: Secondary | ICD-10-CM | POA: Diagnosis not present

## 2023-06-24 DIAGNOSIS — I119 Hypertensive heart disease without heart failure: Secondary | ICD-10-CM | POA: Diagnosis not present

## 2023-06-24 DIAGNOSIS — M1990 Unspecified osteoarthritis, unspecified site: Secondary | ICD-10-CM | POA: Diagnosis not present

## 2023-06-24 DIAGNOSIS — R011 Cardiac murmur, unspecified: Secondary | ICD-10-CM | POA: Diagnosis not present

## 2023-06-24 DIAGNOSIS — K5790 Diverticulosis of intestine, part unspecified, without perforation or abscess without bleeding: Secondary | ICD-10-CM | POA: Diagnosis not present

## 2023-06-24 DIAGNOSIS — M48062 Spinal stenosis, lumbar region with neurogenic claudication: Secondary | ICD-10-CM | POA: Diagnosis not present

## 2023-06-24 DIAGNOSIS — R600 Localized edema: Secondary | ICD-10-CM | POA: Diagnosis not present

## 2023-06-27 DIAGNOSIS — R011 Cardiac murmur, unspecified: Secondary | ICD-10-CM | POA: Diagnosis not present

## 2023-06-27 DIAGNOSIS — I119 Hypertensive heart disease without heart failure: Secondary | ICD-10-CM | POA: Diagnosis not present

## 2023-06-27 DIAGNOSIS — K5790 Diverticulosis of intestine, part unspecified, without perforation or abscess without bleeding: Secondary | ICD-10-CM | POA: Diagnosis not present

## 2023-06-27 DIAGNOSIS — R600 Localized edema: Secondary | ICD-10-CM | POA: Diagnosis not present

## 2023-06-27 DIAGNOSIS — M48062 Spinal stenosis, lumbar region with neurogenic claudication: Secondary | ICD-10-CM | POA: Diagnosis not present

## 2023-06-27 DIAGNOSIS — M1990 Unspecified osteoarthritis, unspecified site: Secondary | ICD-10-CM | POA: Diagnosis not present

## 2023-06-30 DIAGNOSIS — D485 Neoplasm of uncertain behavior of skin: Secondary | ICD-10-CM | POA: Diagnosis not present

## 2023-06-30 DIAGNOSIS — L821 Other seborrheic keratosis: Secondary | ICD-10-CM | POA: Diagnosis not present

## 2023-06-30 DIAGNOSIS — L720 Epidermal cyst: Secondary | ICD-10-CM | POA: Diagnosis not present

## 2023-06-30 DIAGNOSIS — D1801 Hemangioma of skin and subcutaneous tissue: Secondary | ICD-10-CM | POA: Diagnosis not present

## 2023-06-30 DIAGNOSIS — L570 Actinic keratosis: Secondary | ICD-10-CM | POA: Diagnosis not present

## 2023-06-30 DIAGNOSIS — D225 Melanocytic nevi of trunk: Secondary | ICD-10-CM | POA: Diagnosis not present

## 2023-06-30 DIAGNOSIS — L853 Xerosis cutis: Secondary | ICD-10-CM | POA: Diagnosis not present

## 2023-07-01 DIAGNOSIS — K5790 Diverticulosis of intestine, part unspecified, without perforation or abscess without bleeding: Secondary | ICD-10-CM | POA: Diagnosis not present

## 2023-07-01 DIAGNOSIS — M48062 Spinal stenosis, lumbar region with neurogenic claudication: Secondary | ICD-10-CM | POA: Diagnosis not present

## 2023-07-01 DIAGNOSIS — M1990 Unspecified osteoarthritis, unspecified site: Secondary | ICD-10-CM | POA: Diagnosis not present

## 2023-07-01 DIAGNOSIS — R600 Localized edema: Secondary | ICD-10-CM | POA: Diagnosis not present

## 2023-07-01 DIAGNOSIS — R011 Cardiac murmur, unspecified: Secondary | ICD-10-CM | POA: Diagnosis not present

## 2023-07-01 DIAGNOSIS — I119 Hypertensive heart disease without heart failure: Secondary | ICD-10-CM | POA: Diagnosis not present

## 2023-07-02 DIAGNOSIS — K5790 Diverticulosis of intestine, part unspecified, without perforation or abscess without bleeding: Secondary | ICD-10-CM | POA: Diagnosis not present

## 2023-07-02 DIAGNOSIS — M1990 Unspecified osteoarthritis, unspecified site: Secondary | ICD-10-CM | POA: Diagnosis not present

## 2023-07-02 DIAGNOSIS — R600 Localized edema: Secondary | ICD-10-CM | POA: Diagnosis not present

## 2023-07-02 DIAGNOSIS — M48062 Spinal stenosis, lumbar region with neurogenic claudication: Secondary | ICD-10-CM | POA: Diagnosis not present

## 2023-07-02 DIAGNOSIS — I119 Hypertensive heart disease without heart failure: Secondary | ICD-10-CM | POA: Diagnosis not present

## 2023-07-02 DIAGNOSIS — R011 Cardiac murmur, unspecified: Secondary | ICD-10-CM | POA: Diagnosis not present

## 2023-07-04 ENCOUNTER — Ambulatory Visit: Payer: Medicare Other | Admitting: Podiatry

## 2023-07-07 DIAGNOSIS — H353123 Nonexudative age-related macular degeneration, left eye, advanced atrophic without subfoveal involvement: Secondary | ICD-10-CM | POA: Diagnosis not present

## 2023-07-07 DIAGNOSIS — H353133 Nonexudative age-related macular degeneration, bilateral, advanced atrophic without subfoveal involvement: Secondary | ICD-10-CM | POA: Diagnosis not present

## 2023-07-07 DIAGNOSIS — H353113 Nonexudative age-related macular degeneration, right eye, advanced atrophic without subfoveal involvement: Secondary | ICD-10-CM | POA: Diagnosis not present

## 2023-07-07 DIAGNOSIS — H43813 Vitreous degeneration, bilateral: Secondary | ICD-10-CM | POA: Diagnosis not present

## 2023-07-08 DIAGNOSIS — M1990 Unspecified osteoarthritis, unspecified site: Secondary | ICD-10-CM | POA: Diagnosis not present

## 2023-07-08 DIAGNOSIS — M48062 Spinal stenosis, lumbar region with neurogenic claudication: Secondary | ICD-10-CM | POA: Diagnosis not present

## 2023-07-08 DIAGNOSIS — R011 Cardiac murmur, unspecified: Secondary | ICD-10-CM | POA: Diagnosis not present

## 2023-07-08 DIAGNOSIS — I119 Hypertensive heart disease without heart failure: Secondary | ICD-10-CM | POA: Diagnosis not present

## 2023-07-08 DIAGNOSIS — K5790 Diverticulosis of intestine, part unspecified, without perforation or abscess without bleeding: Secondary | ICD-10-CM | POA: Diagnosis not present

## 2023-07-08 DIAGNOSIS — R600 Localized edema: Secondary | ICD-10-CM | POA: Diagnosis not present

## 2023-07-09 ENCOUNTER — Encounter: Payer: Self-pay | Admitting: Podiatry

## 2023-07-09 ENCOUNTER — Ambulatory Visit (INDEPENDENT_AMBULATORY_CARE_PROVIDER_SITE_OTHER): Payer: Medicare Other | Admitting: Podiatry

## 2023-07-09 DIAGNOSIS — B351 Tinea unguium: Secondary | ICD-10-CM | POA: Diagnosis not present

## 2023-07-09 DIAGNOSIS — E119 Type 2 diabetes mellitus without complications: Secondary | ICD-10-CM

## 2023-07-09 DIAGNOSIS — M79675 Pain in left toe(s): Secondary | ICD-10-CM

## 2023-07-09 DIAGNOSIS — M79674 Pain in right toe(s): Secondary | ICD-10-CM

## 2023-07-09 NOTE — Progress Notes (Signed)
This patient returns to my office for at risk foot care.  This patient requires this care by a professional since this patient will be at risk due to having diabetes.  This patient is unable to cut nails herself since the patient cannot reach her nails.These nails are painful walking and wearing shoes.  .  This patient presents for at risk foot care today.  General Appearance  Alert, conversant and in no acute stress.  Vascular  Dorsalis pedis and posterior tibial  pulses are palpable  bilaterally.  Capillary return is within normal limits  bilaterally. Temperature is within normal limits  bilaterally.  Neurologic  Senn-Weinstein monofilament wire test within normal limits  bilaterally. Muscle power within normal limits bilaterally.  Nails Thick disfigured discolored nails with subungual debris  Hallux nails  B/L.[name] Kitchen No evidence of bacterial infection or drainage bilaterally.  Orthopedic  No limitations of motion  feet .  No crepitus or effusions noted.  No bony pathology or digital deformities noted. ADV 4th toe right foot.  HAV  B/L>  Skin  normotropic skin with no porokeratosis noted bilaterally.  No signs of infections or ulcers noted.     Onychomycosis  Pain in right toes  Pain in left toes  Consent was obtained for treatment procedures.   Mechanical debridement of nails 1-5  bilaterally performed with a nail nipper.  Filed with dremel without incident.    Return office visit  3 months                  Told patient to return for periodic foot care and evaluation due to potential at risk complications.   Helane Gunther DPM

## 2023-07-11 DIAGNOSIS — M1990 Unspecified osteoarthritis, unspecified site: Secondary | ICD-10-CM | POA: Diagnosis not present

## 2023-07-11 DIAGNOSIS — K5790 Diverticulosis of intestine, part unspecified, without perforation or abscess without bleeding: Secondary | ICD-10-CM | POA: Diagnosis not present

## 2023-07-11 DIAGNOSIS — R011 Cardiac murmur, unspecified: Secondary | ICD-10-CM | POA: Diagnosis not present

## 2023-07-11 DIAGNOSIS — M48062 Spinal stenosis, lumbar region with neurogenic claudication: Secondary | ICD-10-CM | POA: Diagnosis not present

## 2023-07-11 DIAGNOSIS — I119 Hypertensive heart disease without heart failure: Secondary | ICD-10-CM | POA: Diagnosis not present

## 2023-07-11 DIAGNOSIS — R600 Localized edema: Secondary | ICD-10-CM | POA: Diagnosis not present

## 2023-07-14 DIAGNOSIS — M48062 Spinal stenosis, lumbar region with neurogenic claudication: Secondary | ICD-10-CM | POA: Diagnosis not present

## 2023-07-14 DIAGNOSIS — R011 Cardiac murmur, unspecified: Secondary | ICD-10-CM | POA: Diagnosis not present

## 2023-07-14 DIAGNOSIS — K5790 Diverticulosis of intestine, part unspecified, without perforation or abscess without bleeding: Secondary | ICD-10-CM | POA: Diagnosis not present

## 2023-07-14 DIAGNOSIS — I119 Hypertensive heart disease without heart failure: Secondary | ICD-10-CM | POA: Diagnosis not present

## 2023-07-14 DIAGNOSIS — M1990 Unspecified osteoarthritis, unspecified site: Secondary | ICD-10-CM | POA: Diagnosis not present

## 2023-07-14 DIAGNOSIS — R600 Localized edema: Secondary | ICD-10-CM | POA: Diagnosis not present

## 2023-07-18 DIAGNOSIS — K5790 Diverticulosis of intestine, part unspecified, without perforation or abscess without bleeding: Secondary | ICD-10-CM | POA: Diagnosis not present

## 2023-07-18 DIAGNOSIS — M1990 Unspecified osteoarthritis, unspecified site: Secondary | ICD-10-CM | POA: Diagnosis not present

## 2023-07-18 DIAGNOSIS — M48062 Spinal stenosis, lumbar region with neurogenic claudication: Secondary | ICD-10-CM | POA: Diagnosis not present

## 2023-07-18 DIAGNOSIS — R600 Localized edema: Secondary | ICD-10-CM | POA: Diagnosis not present

## 2023-07-18 DIAGNOSIS — R011 Cardiac murmur, unspecified: Secondary | ICD-10-CM | POA: Diagnosis not present

## 2023-07-18 DIAGNOSIS — I119 Hypertensive heart disease without heart failure: Secondary | ICD-10-CM | POA: Diagnosis not present

## 2023-07-20 DIAGNOSIS — M48062 Spinal stenosis, lumbar region with neurogenic claudication: Secondary | ICD-10-CM | POA: Diagnosis not present

## 2023-07-20 DIAGNOSIS — M1990 Unspecified osteoarthritis, unspecified site: Secondary | ICD-10-CM | POA: Diagnosis not present

## 2023-07-20 DIAGNOSIS — R600 Localized edema: Secondary | ICD-10-CM | POA: Diagnosis not present

## 2023-07-20 DIAGNOSIS — I119 Hypertensive heart disease without heart failure: Secondary | ICD-10-CM | POA: Diagnosis not present

## 2023-07-20 DIAGNOSIS — R011 Cardiac murmur, unspecified: Secondary | ICD-10-CM | POA: Diagnosis not present

## 2023-07-20 DIAGNOSIS — K5790 Diverticulosis of intestine, part unspecified, without perforation or abscess without bleeding: Secondary | ICD-10-CM | POA: Diagnosis not present

## 2023-07-21 DIAGNOSIS — R011 Cardiac murmur, unspecified: Secondary | ICD-10-CM | POA: Diagnosis not present

## 2023-07-21 DIAGNOSIS — K5790 Diverticulosis of intestine, part unspecified, without perforation or abscess without bleeding: Secondary | ICD-10-CM | POA: Diagnosis not present

## 2023-07-21 DIAGNOSIS — I119 Hypertensive heart disease without heart failure: Secondary | ICD-10-CM | POA: Diagnosis not present

## 2023-07-21 DIAGNOSIS — M48062 Spinal stenosis, lumbar region with neurogenic claudication: Secondary | ICD-10-CM | POA: Diagnosis not present

## 2023-07-21 DIAGNOSIS — R600 Localized edema: Secondary | ICD-10-CM | POA: Diagnosis not present

## 2023-07-21 DIAGNOSIS — M1990 Unspecified osteoarthritis, unspecified site: Secondary | ICD-10-CM | POA: Diagnosis not present

## 2023-07-22 DIAGNOSIS — M1990 Unspecified osteoarthritis, unspecified site: Secondary | ICD-10-CM | POA: Diagnosis not present

## 2023-07-22 DIAGNOSIS — R600 Localized edema: Secondary | ICD-10-CM | POA: Diagnosis not present

## 2023-07-22 DIAGNOSIS — M48062 Spinal stenosis, lumbar region with neurogenic claudication: Secondary | ICD-10-CM | POA: Diagnosis not present

## 2023-07-22 DIAGNOSIS — K5790 Diverticulosis of intestine, part unspecified, without perforation or abscess without bleeding: Secondary | ICD-10-CM | POA: Diagnosis not present

## 2023-07-22 DIAGNOSIS — I119 Hypertensive heart disease without heart failure: Secondary | ICD-10-CM | POA: Diagnosis not present

## 2023-07-22 DIAGNOSIS — R011 Cardiac murmur, unspecified: Secondary | ICD-10-CM | POA: Diagnosis not present

## 2023-07-23 DIAGNOSIS — F32A Depression, unspecified: Secondary | ICD-10-CM | POA: Diagnosis not present

## 2023-07-23 DIAGNOSIS — I350 Nonrheumatic aortic (valve) stenosis: Secondary | ICD-10-CM | POA: Diagnosis not present

## 2023-07-23 DIAGNOSIS — E1142 Type 2 diabetes mellitus with diabetic polyneuropathy: Secondary | ICD-10-CM | POA: Diagnosis not present

## 2023-07-23 DIAGNOSIS — Z515 Encounter for palliative care: Secondary | ICD-10-CM | POA: Diagnosis not present

## 2023-07-23 DIAGNOSIS — K5790 Diverticulosis of intestine, part unspecified, without perforation or abscess without bleeding: Secondary | ICD-10-CM | POA: Diagnosis not present

## 2023-07-23 DIAGNOSIS — I119 Hypertensive heart disease without heart failure: Secondary | ICD-10-CM | POA: Diagnosis not present

## 2023-07-23 DIAGNOSIS — K219 Gastro-esophageal reflux disease without esophagitis: Secondary | ICD-10-CM | POA: Diagnosis not present

## 2023-07-23 DIAGNOSIS — Z86018 Personal history of other benign neoplasm: Secondary | ICD-10-CM | POA: Diagnosis not present

## 2023-07-23 DIAGNOSIS — H353133 Nonexudative age-related macular degeneration, bilateral, advanced atrophic without subfoveal involvement: Secondary | ICD-10-CM | POA: Diagnosis not present

## 2023-07-23 DIAGNOSIS — M48062 Spinal stenosis, lumbar region with neurogenic claudication: Secondary | ICD-10-CM | POA: Diagnosis not present

## 2023-07-23 DIAGNOSIS — M1990 Unspecified osteoarthritis, unspecified site: Secondary | ICD-10-CM | POA: Diagnosis not present

## 2023-07-23 DIAGNOSIS — R011 Cardiac murmur, unspecified: Secondary | ICD-10-CM | POA: Diagnosis not present

## 2023-07-23 DIAGNOSIS — R600 Localized edema: Secondary | ICD-10-CM | POA: Diagnosis not present

## 2023-07-23 DIAGNOSIS — E782 Mixed hyperlipidemia: Secondary | ICD-10-CM | POA: Diagnosis not present

## 2023-07-23 DIAGNOSIS — Z8701 Personal history of pneumonia (recurrent): Secondary | ICD-10-CM | POA: Diagnosis not present

## 2023-07-23 DIAGNOSIS — E0590 Thyrotoxicosis, unspecified without thyrotoxic crisis or storm: Secondary | ICD-10-CM | POA: Diagnosis not present

## 2023-07-23 DIAGNOSIS — F411 Generalized anxiety disorder: Secondary | ICD-10-CM | POA: Diagnosis not present

## 2023-07-23 DIAGNOSIS — H903 Sensorineural hearing loss, bilateral: Secondary | ICD-10-CM | POA: Diagnosis not present

## 2023-07-24 DIAGNOSIS — M1990 Unspecified osteoarthritis, unspecified site: Secondary | ICD-10-CM | POA: Diagnosis not present

## 2023-07-24 DIAGNOSIS — M48062 Spinal stenosis, lumbar region with neurogenic claudication: Secondary | ICD-10-CM | POA: Diagnosis not present

## 2023-07-24 DIAGNOSIS — R011 Cardiac murmur, unspecified: Secondary | ICD-10-CM | POA: Diagnosis not present

## 2023-07-24 DIAGNOSIS — M1712 Unilateral primary osteoarthritis, left knee: Secondary | ICD-10-CM | POA: Diagnosis not present

## 2023-07-24 DIAGNOSIS — M1711 Unilateral primary osteoarthritis, right knee: Secondary | ICD-10-CM | POA: Diagnosis not present

## 2023-07-24 DIAGNOSIS — M160 Bilateral primary osteoarthritis of hip: Secondary | ICD-10-CM | POA: Diagnosis not present

## 2023-07-24 DIAGNOSIS — K5790 Diverticulosis of intestine, part unspecified, without perforation or abscess without bleeding: Secondary | ICD-10-CM | POA: Diagnosis not present

## 2023-07-24 DIAGNOSIS — I119 Hypertensive heart disease without heart failure: Secondary | ICD-10-CM | POA: Diagnosis not present

## 2023-07-24 DIAGNOSIS — R600 Localized edema: Secondary | ICD-10-CM | POA: Diagnosis not present

## 2023-07-24 DIAGNOSIS — M170 Bilateral primary osteoarthritis of knee: Secondary | ICD-10-CM | POA: Diagnosis not present

## 2023-07-28 DIAGNOSIS — M48062 Spinal stenosis, lumbar region with neurogenic claudication: Secondary | ICD-10-CM | POA: Diagnosis not present

## 2023-07-28 DIAGNOSIS — I119 Hypertensive heart disease without heart failure: Secondary | ICD-10-CM | POA: Diagnosis not present

## 2023-07-28 DIAGNOSIS — R011 Cardiac murmur, unspecified: Secondary | ICD-10-CM | POA: Diagnosis not present

## 2023-07-28 DIAGNOSIS — M1990 Unspecified osteoarthritis, unspecified site: Secondary | ICD-10-CM | POA: Diagnosis not present

## 2023-07-28 DIAGNOSIS — R600 Localized edema: Secondary | ICD-10-CM | POA: Diagnosis not present

## 2023-07-28 DIAGNOSIS — K5790 Diverticulosis of intestine, part unspecified, without perforation or abscess without bleeding: Secondary | ICD-10-CM | POA: Diagnosis not present

## 2023-07-30 DIAGNOSIS — R600 Localized edema: Secondary | ICD-10-CM | POA: Diagnosis not present

## 2023-07-30 DIAGNOSIS — K5790 Diverticulosis of intestine, part unspecified, without perforation or abscess without bleeding: Secondary | ICD-10-CM | POA: Diagnosis not present

## 2023-07-30 DIAGNOSIS — I119 Hypertensive heart disease without heart failure: Secondary | ICD-10-CM | POA: Diagnosis not present

## 2023-07-30 DIAGNOSIS — M1990 Unspecified osteoarthritis, unspecified site: Secondary | ICD-10-CM | POA: Diagnosis not present

## 2023-07-30 DIAGNOSIS — R011 Cardiac murmur, unspecified: Secondary | ICD-10-CM | POA: Diagnosis not present

## 2023-07-30 DIAGNOSIS — M48062 Spinal stenosis, lumbar region with neurogenic claudication: Secondary | ICD-10-CM | POA: Diagnosis not present

## 2023-08-01 DIAGNOSIS — R600 Localized edema: Secondary | ICD-10-CM | POA: Diagnosis not present

## 2023-08-01 DIAGNOSIS — R011 Cardiac murmur, unspecified: Secondary | ICD-10-CM | POA: Diagnosis not present

## 2023-08-01 DIAGNOSIS — M1990 Unspecified osteoarthritis, unspecified site: Secondary | ICD-10-CM | POA: Diagnosis not present

## 2023-08-01 DIAGNOSIS — K5790 Diverticulosis of intestine, part unspecified, without perforation or abscess without bleeding: Secondary | ICD-10-CM | POA: Diagnosis not present

## 2023-08-01 DIAGNOSIS — M48062 Spinal stenosis, lumbar region with neurogenic claudication: Secondary | ICD-10-CM | POA: Diagnosis not present

## 2023-08-01 DIAGNOSIS — I119 Hypertensive heart disease without heart failure: Secondary | ICD-10-CM | POA: Diagnosis not present

## 2023-08-04 DIAGNOSIS — M48062 Spinal stenosis, lumbar region with neurogenic claudication: Secondary | ICD-10-CM | POA: Diagnosis not present

## 2023-08-04 DIAGNOSIS — R600 Localized edema: Secondary | ICD-10-CM | POA: Diagnosis not present

## 2023-08-04 DIAGNOSIS — I119 Hypertensive heart disease without heart failure: Secondary | ICD-10-CM | POA: Diagnosis not present

## 2023-08-04 DIAGNOSIS — R011 Cardiac murmur, unspecified: Secondary | ICD-10-CM | POA: Diagnosis not present

## 2023-08-04 DIAGNOSIS — M1990 Unspecified osteoarthritis, unspecified site: Secondary | ICD-10-CM | POA: Diagnosis not present

## 2023-08-04 DIAGNOSIS — K5790 Diverticulosis of intestine, part unspecified, without perforation or abscess without bleeding: Secondary | ICD-10-CM | POA: Diagnosis not present

## 2023-08-06 DIAGNOSIS — I119 Hypertensive heart disease without heart failure: Secondary | ICD-10-CM | POA: Diagnosis not present

## 2023-08-06 DIAGNOSIS — K5790 Diverticulosis of intestine, part unspecified, without perforation or abscess without bleeding: Secondary | ICD-10-CM | POA: Diagnosis not present

## 2023-08-06 DIAGNOSIS — R600 Localized edema: Secondary | ICD-10-CM | POA: Diagnosis not present

## 2023-08-06 DIAGNOSIS — R011 Cardiac murmur, unspecified: Secondary | ICD-10-CM | POA: Diagnosis not present

## 2023-08-06 DIAGNOSIS — M48062 Spinal stenosis, lumbar region with neurogenic claudication: Secondary | ICD-10-CM | POA: Diagnosis not present

## 2023-08-06 DIAGNOSIS — M1990 Unspecified osteoarthritis, unspecified site: Secondary | ICD-10-CM | POA: Diagnosis not present

## 2023-08-08 DIAGNOSIS — M7061 Trochanteric bursitis, right hip: Secondary | ICD-10-CM | POA: Diagnosis not present

## 2023-08-08 DIAGNOSIS — M160 Bilateral primary osteoarthritis of hip: Secondary | ICD-10-CM | POA: Diagnosis not present

## 2023-08-08 DIAGNOSIS — M7062 Trochanteric bursitis, left hip: Secondary | ICD-10-CM | POA: Diagnosis not present

## 2023-08-11 DIAGNOSIS — K5790 Diverticulosis of intestine, part unspecified, without perforation or abscess without bleeding: Secondary | ICD-10-CM | POA: Diagnosis not present

## 2023-08-11 DIAGNOSIS — M1990 Unspecified osteoarthritis, unspecified site: Secondary | ICD-10-CM | POA: Diagnosis not present

## 2023-08-11 DIAGNOSIS — M48062 Spinal stenosis, lumbar region with neurogenic claudication: Secondary | ICD-10-CM | POA: Diagnosis not present

## 2023-08-11 DIAGNOSIS — I119 Hypertensive heart disease without heart failure: Secondary | ICD-10-CM | POA: Diagnosis not present

## 2023-08-11 DIAGNOSIS — R600 Localized edema: Secondary | ICD-10-CM | POA: Diagnosis not present

## 2023-08-11 DIAGNOSIS — R011 Cardiac murmur, unspecified: Secondary | ICD-10-CM | POA: Diagnosis not present

## 2023-08-12 DIAGNOSIS — M1990 Unspecified osteoarthritis, unspecified site: Secondary | ICD-10-CM | POA: Diagnosis not present

## 2023-08-12 DIAGNOSIS — R011 Cardiac murmur, unspecified: Secondary | ICD-10-CM | POA: Diagnosis not present

## 2023-08-12 DIAGNOSIS — M48062 Spinal stenosis, lumbar region with neurogenic claudication: Secondary | ICD-10-CM | POA: Diagnosis not present

## 2023-08-12 DIAGNOSIS — K5790 Diverticulosis of intestine, part unspecified, without perforation or abscess without bleeding: Secondary | ICD-10-CM | POA: Diagnosis not present

## 2023-08-12 DIAGNOSIS — R600 Localized edema: Secondary | ICD-10-CM | POA: Diagnosis not present

## 2023-08-12 DIAGNOSIS — I119 Hypertensive heart disease without heart failure: Secondary | ICD-10-CM | POA: Diagnosis not present

## 2023-08-13 DIAGNOSIS — M48062 Spinal stenosis, lumbar region with neurogenic claudication: Secondary | ICD-10-CM | POA: Diagnosis not present

## 2023-08-13 DIAGNOSIS — I119 Hypertensive heart disease without heart failure: Secondary | ICD-10-CM | POA: Diagnosis not present

## 2023-08-13 DIAGNOSIS — M1990 Unspecified osteoarthritis, unspecified site: Secondary | ICD-10-CM | POA: Diagnosis not present

## 2023-08-13 DIAGNOSIS — K5790 Diverticulosis of intestine, part unspecified, without perforation or abscess without bleeding: Secondary | ICD-10-CM | POA: Diagnosis not present

## 2023-08-13 DIAGNOSIS — R600 Localized edema: Secondary | ICD-10-CM | POA: Diagnosis not present

## 2023-08-13 DIAGNOSIS — R011 Cardiac murmur, unspecified: Secondary | ICD-10-CM | POA: Diagnosis not present

## 2023-08-14 DIAGNOSIS — L4059 Other psoriatic arthropathy: Secondary | ICD-10-CM | POA: Diagnosis not present

## 2023-08-16 DIAGNOSIS — K5790 Diverticulosis of intestine, part unspecified, without perforation or abscess without bleeding: Secondary | ICD-10-CM | POA: Diagnosis not present

## 2023-08-16 DIAGNOSIS — M48062 Spinal stenosis, lumbar region with neurogenic claudication: Secondary | ICD-10-CM | POA: Diagnosis not present

## 2023-08-16 DIAGNOSIS — R011 Cardiac murmur, unspecified: Secondary | ICD-10-CM | POA: Diagnosis not present

## 2023-08-16 DIAGNOSIS — I119 Hypertensive heart disease without heart failure: Secondary | ICD-10-CM | POA: Diagnosis not present

## 2023-08-16 DIAGNOSIS — M1990 Unspecified osteoarthritis, unspecified site: Secondary | ICD-10-CM | POA: Diagnosis not present

## 2023-08-16 DIAGNOSIS — R600 Localized edema: Secondary | ICD-10-CM | POA: Diagnosis not present

## 2023-08-17 DIAGNOSIS — K5790 Diverticulosis of intestine, part unspecified, without perforation or abscess without bleeding: Secondary | ICD-10-CM | POA: Diagnosis not present

## 2023-08-17 DIAGNOSIS — R600 Localized edema: Secondary | ICD-10-CM | POA: Diagnosis not present

## 2023-08-17 DIAGNOSIS — I119 Hypertensive heart disease without heart failure: Secondary | ICD-10-CM | POA: Diagnosis not present

## 2023-08-17 DIAGNOSIS — R011 Cardiac murmur, unspecified: Secondary | ICD-10-CM | POA: Diagnosis not present

## 2023-08-17 DIAGNOSIS — M1990 Unspecified osteoarthritis, unspecified site: Secondary | ICD-10-CM | POA: Diagnosis not present

## 2023-08-17 DIAGNOSIS — M48062 Spinal stenosis, lumbar region with neurogenic claudication: Secondary | ICD-10-CM | POA: Diagnosis not present

## 2023-08-18 DIAGNOSIS — I119 Hypertensive heart disease without heart failure: Secondary | ICD-10-CM | POA: Diagnosis not present

## 2023-08-18 DIAGNOSIS — M1990 Unspecified osteoarthritis, unspecified site: Secondary | ICD-10-CM | POA: Diagnosis not present

## 2023-08-18 DIAGNOSIS — M48062 Spinal stenosis, lumbar region with neurogenic claudication: Secondary | ICD-10-CM | POA: Diagnosis not present

## 2023-08-18 DIAGNOSIS — K5790 Diverticulosis of intestine, part unspecified, without perforation or abscess without bleeding: Secondary | ICD-10-CM | POA: Diagnosis not present

## 2023-08-18 DIAGNOSIS — R600 Localized edema: Secondary | ICD-10-CM | POA: Diagnosis not present

## 2023-08-18 DIAGNOSIS — R011 Cardiac murmur, unspecified: Secondary | ICD-10-CM | POA: Diagnosis not present

## 2023-08-19 DIAGNOSIS — M1990 Unspecified osteoarthritis, unspecified site: Secondary | ICD-10-CM | POA: Diagnosis not present

## 2023-08-19 DIAGNOSIS — R011 Cardiac murmur, unspecified: Secondary | ICD-10-CM | POA: Diagnosis not present

## 2023-08-19 DIAGNOSIS — R600 Localized edema: Secondary | ICD-10-CM | POA: Diagnosis not present

## 2023-08-19 DIAGNOSIS — I119 Hypertensive heart disease without heart failure: Secondary | ICD-10-CM | POA: Diagnosis not present

## 2023-08-19 DIAGNOSIS — K5790 Diverticulosis of intestine, part unspecified, without perforation or abscess without bleeding: Secondary | ICD-10-CM | POA: Diagnosis not present

## 2023-08-19 DIAGNOSIS — M48062 Spinal stenosis, lumbar region with neurogenic claudication: Secondary | ICD-10-CM | POA: Diagnosis not present

## 2023-08-20 DIAGNOSIS — R011 Cardiac murmur, unspecified: Secondary | ICD-10-CM | POA: Diagnosis not present

## 2023-08-20 DIAGNOSIS — I119 Hypertensive heart disease without heart failure: Secondary | ICD-10-CM | POA: Diagnosis not present

## 2023-08-20 DIAGNOSIS — R600 Localized edema: Secondary | ICD-10-CM | POA: Diagnosis not present

## 2023-08-20 DIAGNOSIS — M48062 Spinal stenosis, lumbar region with neurogenic claudication: Secondary | ICD-10-CM | POA: Diagnosis not present

## 2023-08-20 DIAGNOSIS — M1990 Unspecified osteoarthritis, unspecified site: Secondary | ICD-10-CM | POA: Diagnosis not present

## 2023-08-20 DIAGNOSIS — K5790 Diverticulosis of intestine, part unspecified, without perforation or abscess without bleeding: Secondary | ICD-10-CM | POA: Diagnosis not present

## 2023-08-21 DIAGNOSIS — H353113 Nonexudative age-related macular degeneration, right eye, advanced atrophic without subfoveal involvement: Secondary | ICD-10-CM | POA: Diagnosis not present

## 2023-08-21 DIAGNOSIS — H43813 Vitreous degeneration, bilateral: Secondary | ICD-10-CM | POA: Diagnosis not present

## 2023-08-21 DIAGNOSIS — H353133 Nonexudative age-related macular degeneration, bilateral, advanced atrophic without subfoveal involvement: Secondary | ICD-10-CM | POA: Diagnosis not present

## 2023-08-21 DIAGNOSIS — H353123 Nonexudative age-related macular degeneration, left eye, advanced atrophic without subfoveal involvement: Secondary | ICD-10-CM | POA: Diagnosis not present

## 2023-08-23 DIAGNOSIS — R600 Localized edema: Secondary | ICD-10-CM | POA: Diagnosis not present

## 2023-08-23 DIAGNOSIS — Z8701 Personal history of pneumonia (recurrent): Secondary | ICD-10-CM | POA: Diagnosis not present

## 2023-08-23 DIAGNOSIS — H353133 Nonexudative age-related macular degeneration, bilateral, advanced atrophic without subfoveal involvement: Secondary | ICD-10-CM | POA: Diagnosis not present

## 2023-08-23 DIAGNOSIS — Z515 Encounter for palliative care: Secondary | ICD-10-CM | POA: Diagnosis not present

## 2023-08-23 DIAGNOSIS — I119 Hypertensive heart disease without heart failure: Secondary | ICD-10-CM | POA: Diagnosis not present

## 2023-08-23 DIAGNOSIS — F32A Depression, unspecified: Secondary | ICD-10-CM | POA: Diagnosis not present

## 2023-08-23 DIAGNOSIS — E0590 Thyrotoxicosis, unspecified without thyrotoxic crisis or storm: Secondary | ICD-10-CM | POA: Diagnosis not present

## 2023-08-23 DIAGNOSIS — M48062 Spinal stenosis, lumbar region with neurogenic claudication: Secondary | ICD-10-CM | POA: Diagnosis not present

## 2023-08-23 DIAGNOSIS — F411 Generalized anxiety disorder: Secondary | ICD-10-CM | POA: Diagnosis not present

## 2023-08-23 DIAGNOSIS — Z86018 Personal history of other benign neoplasm: Secondary | ICD-10-CM | POA: Diagnosis not present

## 2023-08-23 DIAGNOSIS — I350 Nonrheumatic aortic (valve) stenosis: Secondary | ICD-10-CM | POA: Diagnosis not present

## 2023-08-23 DIAGNOSIS — H903 Sensorineural hearing loss, bilateral: Secondary | ICD-10-CM | POA: Diagnosis not present

## 2023-08-23 DIAGNOSIS — E782 Mixed hyperlipidemia: Secondary | ICD-10-CM | POA: Diagnosis not present

## 2023-08-23 DIAGNOSIS — K219 Gastro-esophageal reflux disease without esophagitis: Secondary | ICD-10-CM | POA: Diagnosis not present

## 2023-08-23 DIAGNOSIS — M1990 Unspecified osteoarthritis, unspecified site: Secondary | ICD-10-CM | POA: Diagnosis not present

## 2023-08-23 DIAGNOSIS — E1142 Type 2 diabetes mellitus with diabetic polyneuropathy: Secondary | ICD-10-CM | POA: Diagnosis not present

## 2023-08-23 DIAGNOSIS — R011 Cardiac murmur, unspecified: Secondary | ICD-10-CM | POA: Diagnosis not present

## 2023-08-23 DIAGNOSIS — K5790 Diverticulosis of intestine, part unspecified, without perforation or abscess without bleeding: Secondary | ICD-10-CM | POA: Diagnosis not present

## 2023-08-25 DIAGNOSIS — Z6837 Body mass index (BMI) 37.0-37.9, adult: Secondary | ICD-10-CM | POA: Diagnosis not present

## 2023-08-25 DIAGNOSIS — L4059 Other psoriatic arthropathy: Secondary | ICD-10-CM | POA: Diagnosis not present

## 2023-08-25 DIAGNOSIS — E041 Nontoxic single thyroid nodule: Secondary | ICD-10-CM | POA: Diagnosis not present

## 2023-08-25 DIAGNOSIS — M7989 Other specified soft tissue disorders: Secondary | ICD-10-CM | POA: Diagnosis not present

## 2023-08-25 DIAGNOSIS — R600 Localized edema: Secondary | ICD-10-CM | POA: Diagnosis not present

## 2023-08-25 DIAGNOSIS — R5382 Chronic fatigue, unspecified: Secondary | ICD-10-CM | POA: Diagnosis not present

## 2023-08-25 DIAGNOSIS — R7989 Other specified abnormal findings of blood chemistry: Secondary | ICD-10-CM | POA: Diagnosis not present

## 2023-08-25 DIAGNOSIS — E669 Obesity, unspecified: Secondary | ICD-10-CM | POA: Diagnosis not present

## 2023-08-25 DIAGNOSIS — M810 Age-related osteoporosis without current pathological fracture: Secondary | ICD-10-CM | POA: Diagnosis not present

## 2023-08-25 DIAGNOSIS — M1991 Primary osteoarthritis, unspecified site: Secondary | ICD-10-CM | POA: Diagnosis not present

## 2023-08-26 DIAGNOSIS — I119 Hypertensive heart disease without heart failure: Secondary | ICD-10-CM | POA: Diagnosis not present

## 2023-08-26 DIAGNOSIS — R600 Localized edema: Secondary | ICD-10-CM | POA: Diagnosis not present

## 2023-08-26 DIAGNOSIS — R011 Cardiac murmur, unspecified: Secondary | ICD-10-CM | POA: Diagnosis not present

## 2023-08-26 DIAGNOSIS — M48062 Spinal stenosis, lumbar region with neurogenic claudication: Secondary | ICD-10-CM | POA: Diagnosis not present

## 2023-08-26 DIAGNOSIS — M1990 Unspecified osteoarthritis, unspecified site: Secondary | ICD-10-CM | POA: Diagnosis not present

## 2023-08-26 DIAGNOSIS — K5790 Diverticulosis of intestine, part unspecified, without perforation or abscess without bleeding: Secondary | ICD-10-CM | POA: Diagnosis not present

## 2023-08-27 DIAGNOSIS — M48062 Spinal stenosis, lumbar region with neurogenic claudication: Secondary | ICD-10-CM | POA: Diagnosis not present

## 2023-08-27 DIAGNOSIS — R600 Localized edema: Secondary | ICD-10-CM | POA: Diagnosis not present

## 2023-08-27 DIAGNOSIS — I119 Hypertensive heart disease without heart failure: Secondary | ICD-10-CM | POA: Diagnosis not present

## 2023-08-27 DIAGNOSIS — R011 Cardiac murmur, unspecified: Secondary | ICD-10-CM | POA: Diagnosis not present

## 2023-08-27 DIAGNOSIS — M1990 Unspecified osteoarthritis, unspecified site: Secondary | ICD-10-CM | POA: Diagnosis not present

## 2023-08-27 DIAGNOSIS — K5790 Diverticulosis of intestine, part unspecified, without perforation or abscess without bleeding: Secondary | ICD-10-CM | POA: Diagnosis not present

## 2023-09-04 DIAGNOSIS — R300 Dysuria: Secondary | ICD-10-CM | POA: Diagnosis not present

## 2023-09-04 DIAGNOSIS — Z9889 Other specified postprocedural states: Secondary | ICD-10-CM | POA: Diagnosis not present

## 2023-09-04 DIAGNOSIS — R350 Frequency of micturition: Secondary | ICD-10-CM | POA: Diagnosis not present

## 2023-09-04 DIAGNOSIS — Z6836 Body mass index (BMI) 36.0-36.9, adult: Secondary | ICD-10-CM | POA: Diagnosis not present

## 2023-09-08 DIAGNOSIS — R350 Frequency of micturition: Secondary | ICD-10-CM | POA: Diagnosis not present

## 2023-09-08 DIAGNOSIS — Z87442 Personal history of urinary calculi: Secondary | ICD-10-CM | POA: Diagnosis not present

## 2023-09-08 DIAGNOSIS — M519 Unspecified thoracic, thoracolumbar and lumbosacral intervertebral disc disorder: Secondary | ICD-10-CM | POA: Diagnosis not present

## 2023-09-08 DIAGNOSIS — M130 Polyarthritis, unspecified: Secondary | ICD-10-CM | POA: Diagnosis not present

## 2023-09-08 DIAGNOSIS — Z9181 History of falling: Secondary | ICD-10-CM | POA: Diagnosis not present

## 2023-09-08 DIAGNOSIS — E119 Type 2 diabetes mellitus without complications: Secondary | ICD-10-CM | POA: Diagnosis not present

## 2023-09-08 DIAGNOSIS — Z556 Problems related to health literacy: Secondary | ICD-10-CM | POA: Diagnosis not present

## 2023-09-08 DIAGNOSIS — Z6836 Body mass index (BMI) 36.0-36.9, adult: Secondary | ICD-10-CM | POA: Diagnosis not present

## 2023-09-08 DIAGNOSIS — Z9049 Acquired absence of other specified parts of digestive tract: Secondary | ICD-10-CM | POA: Diagnosis not present

## 2023-09-08 DIAGNOSIS — I10 Essential (primary) hypertension: Secondary | ICD-10-CM | POA: Diagnosis not present

## 2023-09-08 DIAGNOSIS — E669 Obesity, unspecified: Secondary | ICD-10-CM | POA: Diagnosis not present

## 2023-09-08 DIAGNOSIS — H9191 Unspecified hearing loss, right ear: Secondary | ICD-10-CM | POA: Diagnosis not present

## 2023-09-08 DIAGNOSIS — Z604 Social exclusion and rejection: Secondary | ICD-10-CM | POA: Diagnosis not present

## 2023-09-08 DIAGNOSIS — L4050 Arthropathic psoriasis, unspecified: Secondary | ICD-10-CM | POA: Diagnosis not present

## 2023-09-08 DIAGNOSIS — Z79899 Other long term (current) drug therapy: Secondary | ICD-10-CM | POA: Diagnosis not present

## 2023-09-08 DIAGNOSIS — K5790 Diverticulosis of intestine, part unspecified, without perforation or abscess without bleeding: Secondary | ICD-10-CM | POA: Diagnosis not present

## 2023-09-08 DIAGNOSIS — M810 Age-related osteoporosis without current pathological fracture: Secondary | ICD-10-CM | POA: Diagnosis not present

## 2023-09-08 DIAGNOSIS — H353191 Nonexudative age-related macular degeneration, unspecified eye, early dry stage: Secondary | ICD-10-CM | POA: Diagnosis not present

## 2023-09-08 DIAGNOSIS — I350 Nonrheumatic aortic (valve) stenosis: Secondary | ICD-10-CM | POA: Diagnosis not present

## 2023-09-08 DIAGNOSIS — Z87891 Personal history of nicotine dependence: Secondary | ICD-10-CM | POA: Diagnosis not present

## 2023-09-08 DIAGNOSIS — Z7982 Long term (current) use of aspirin: Secondary | ICD-10-CM | POA: Diagnosis not present

## 2023-09-09 ENCOUNTER — Ambulatory Visit (HOSPITAL_BASED_OUTPATIENT_CLINIC_OR_DEPARTMENT_OTHER): Payer: Medicare Other | Admitting: Family

## 2023-09-09 ENCOUNTER — Encounter (HOSPITAL_BASED_OUTPATIENT_CLINIC_OR_DEPARTMENT_OTHER): Payer: Self-pay | Admitting: Family

## 2023-09-09 VITALS — BP 102/58 | HR 60 | Ht 60.0 in | Wt 190.4 lb

## 2023-09-09 DIAGNOSIS — I350 Nonrheumatic aortic (valve) stenosis: Secondary | ICD-10-CM | POA: Diagnosis not present

## 2023-09-09 DIAGNOSIS — R600 Localized edema: Secondary | ICD-10-CM | POA: Diagnosis not present

## 2023-09-09 DIAGNOSIS — I10 Essential (primary) hypertension: Secondary | ICD-10-CM | POA: Diagnosis not present

## 2023-09-09 DIAGNOSIS — Z8249 Family history of ischemic heart disease and other diseases of the circulatory system: Secondary | ICD-10-CM

## 2023-09-09 MED ORDER — LOSARTAN POTASSIUM 100 MG PO TABS: 100.0000 mg | ORAL_TABLET | Freq: Every day | ORAL | 3 refills | Status: DC

## 2023-09-09 NOTE — Patient Instructions (Signed)
Medication Instructions:  Stop hydrochlorothiazide - Losartan  Start Losartan 100 mg once a day *If you need a refill on your cardiac medications before your next appointment, please call your pharmacy*  Lab Work: In 2-3 weeks we will need to draw a bmet If you have labs (blood work) drawn today and your tests are completely normal, you will receive your results only by: MyChart Message (if you have MyChart) OR A paper copy in the mail If you have any lab test that is abnormal or we need to change your treatment, we will call you to review the results.  Testing/Procedures: Your physician has requested that you have an echocardiogram. Echocardiography is a painless test that uses sound waves to create images of your heart. It provides your doctor with information about the size and shape of your heart and how well your heart's chambers and valves are working. This procedure takes approximately one hour. There are no restrictions for this procedure. Please do NOT wear cologne, perfume, aftershave, or lotions (deodorant is allowed). Please arrive 15 minutes prior to your appointment time.  Please note: We ask at that you not bring children with you during ultrasound (echo/ vascular) testing. Due to room size and safety concerns, children are not allowed in the ultrasound rooms during exams. Our front office staff cannot provide observation of children in our lobby area while testing is being conducted. An adult accompanying a patient to their appointment will only be allowed in the ultrasound room at the discretion of the ultrasound technician under special circumstances. We apologize for any inconvenience.  Follow-Up: At [facility], you and your health needs are our priority.  As part of our continuing mission to provide you with exceptional heart care, we have created designated Provider Care Teams.  These Care Teams include your primary Cardiologist (physician) and Advanced Practice  Providers (APPs -  Physician Assistants and Nurse Practitioners) who all work together to provide you with the care you need, when you need it.  We recommend signing up for the patient portal called "MyChart".  Sign up information is provided on this After Visit Summary.  MyChart is used to connect with patients for Virtual Visits (Telemedicine).  Patients are able to view lab/test results, encounter notes, upcoming appointments, etc.  Non-urgent messages can be sent to your provider as well.   To learn more about what you can do with MyChart, go to [email or web address].    Your next appointment:   Already scheduled   Other Instructions To prevent or reduce lower extremity swelling: Eat a low salt diet. Salt makes the body hold onto extra fluid which causes swelling. Sit with legs elevated. For example, in the recliner or on an ottoman.  Wear knee-high compression stockings during the daytime. Ones labeled 15-20 mmHg provide good compression.   Chronic Venous Insufficiency Chronic venous insufficiency is a condition that causes the veins in the legs to struggle to pump blood from the legs to the heart. It is also called venous stasis. This condition can happen when the vein walls are stretched, weakened, or damaged. It can also happen when the valves inside the vein are damaged. With the right treatment, you should be able to still lead an active life. What are the causes? Common causes of this condition include: Venous hypertension. This is high blood pressure inside the veins. Sitting or standing too long. This can cause increased blood pressure in the veins of the leg. Deep vein thrombosis (DVT). This is a  blood clot that blocks blood flow in a vein. Phlebitis. This is inflammation of a vein. It can cause a blood clot to form. An abnormal growth of cells (tumor) in the area between your hip bones (pelvis). This can cause blood to back up. What increases the risk? Factors that may make  you more likely to get this condition include: Having a family history of the condition. Being overweight. Being pregnant. Not getting enough exercise. Smoking. Having a job that requires you to sit or stand in one place for a long time. Being a certain age. Females in their 67s and 73s and males in their 79s are more likely to get this condition. What are the signs or symptoms? Symptoms of this condition include: Varicose veins. These are veins that are enlarged, bulging, or twisted. Skin breakdown or ulcers. Reddened skin or dark discoloration of the skin on the leg between the knee and ankle. Lipodermatosclerosis. This is brown, smooth, tight, and painful skin just above the ankle. It is often on the inside of the leg. Swelling of the legs. How is this diagnosed? This condition may be diagnosed based on your medical history and a physical exam. You may also need tests, such as: A duplex ultrasound. This shows how blood flows through a blood vessel. Plethysmography. This tests blood flow. Venogram. This looks at the veins using an X-ray and dye. How is this treated? The goals of treatment are to help you return to an active life and to relieve pain. Treatment may include: Wearing compression stockings. These do not cure the condition but can help relieve symptoms. They can also help stop your condition from getting worse. Sclerotherapy. This involves injecting a solution to shrink damaged veins. Surgery. This may include: Vein stripping. This is when a diseased vein is taken out. Laser ablation surgery. This is when blood flow is cut off through the vein. Repairing or remaking a valve inside the affected vein. Follow these instructions at home: Lifestyle Do not use any products that contain nicotine or tobacco. These products include cigarettes, chewing tobacco, and vaping devices, such as e-cigarettes. If you need help quitting, ask your health care provider. Stay active. Exercise,  walk, or do other activities. Ask your provider what activities are safe for you. General instructions Take over-the-counter and prescription medicines only as told by your provider. Drink enough fluid to keep your pee (urine) pale yellow. Wear compression stockings as told by your provider. These stockings help to prevent blood clots and reduce swelling in your legs. Keep all follow-up visits. Your provider will check your legs for any changes and adjust your treatment plan as needed. Contact a health care provider if: You have redness, swelling, or more pain in the affected area. You see a red streak or line that goes up or down from the area. You have skin breakdown or skin loss. You get an injury in the affected area. You get a fever. Get help right away if: You have severe pain that does not get better with medicine. You get an injury and an open wound in the affected area. Your foot or ankle becomes numb or weak all of a sudden. You have trouble moving your foot or ankle. Your symptoms do not go away or get worse. You have chest pain. You have shortness of breath. These symptoms may be an emergency. Get help right away. Call 911. Do not wait to see if the symptoms will go away. Do not drive yourself to the  hospital. This information is not intended to replace advice given to you by your health care provider. Make sure you discuss any questions you have with your health care provider. Document Revised: 07/23/2022 Document Reviewed: 07/23/2022 Elsevier Patient Education  2024 [employer].

## 2023-09-09 NOTE — Progress Notes (Unsigned)
  Cardiology Office Note:  .   Date:  09/09/2023  ID:  Lisa [name], DOB [birth year], MRN [id number] PCP: Lisa Soho, PA-C  [facility] Providers Cardiologist:  Lisa Schultz, MD { Click to update primary MD,subspecialty MD or APP then REFRESH:1}   History of Present Illness: [name] Kitchen   Lisa [name] is a 88 y.o. female *** with history of AS, HTN, psoriatic arthritis, OP, radioactive iodine ablation for unspecified condition.   Echo 08/2022 mild to moderate AS.   Last seen 02/2023 by Dr. Anne [name]. Her back surgery ad gone well and outside provider had prescribed Lasix and potassium for LE edema but had not yet started. She was encouraged to start Lasix 20mg  daily and potassium daily.   08/14/23 creatinine 0.77, GFR  Presents today for follow up withher nephrew. Feeling overall well since last seen. Reports no chest pain, pressure. Reports a rare chest pressure which self resolves and is fleeting which she attirubtes to indigestion. Reports she is doing physical therapy for her back at home which just started. Has had some orthopedic injections regularly.   Reports persistent LE edema. It is worse int he evening and better in the morning. She does keep her feet up when sitting. She does note occasional indiscretion in low sodium diet.   She is concerned about relative hypotension with BP 100/70 yesterday. Lighteheadedness only with quick position changes or activities like getting out of the bed early in the mroning.   ROS: ***  Studies Reviewed: .        *** Risk Assessment/Calculations:   {Does this patient have ATRIAL FIBRILLATION?[phone]}         Physical Exam:   VS:  BP (!) 102/58   Pulse 60   Ht 5' (1.524 m)   Wt 190 lb 6.4 oz (86.4 kg)   LMP  (LMP Unknown)   SpO2 97%   BMI 37.18 kg/m    Wt Readings from Last 3 Encounters:  09/09/23 190 lb 6.4 oz (86.4 kg)  03/05/23 189 lb 8 oz (86 kg)  12/24/22 189 lb 6.4 oz (85.9 kg)    GEN: Well nourished, well developed  in no acute distress NECK: No JVD; No carotid bruits CARDIAC: ***RRR, no murmurs, rubs, gallops RESPIRATORY:  Clear to auscultation without rales, wheezing or rhonchi  ABDOMEN: Soft, non-tender, non-distended EXTREMITIES:  No edema; No deformity   ASSESSMENT AND PLAN: .   ***    {Are you ordering a CV Procedure (e.g. stress test, cath, DCCV, TEE, etc)?   Press F2        :[id number]}  Dispo: ***  Signed, Alver Sorrow, NP

## 2023-09-17 DIAGNOSIS — E119 Type 2 diabetes mellitus without complications: Secondary | ICD-10-CM | POA: Diagnosis not present

## 2023-09-17 DIAGNOSIS — L4050 Arthropathic psoriasis, unspecified: Secondary | ICD-10-CM | POA: Diagnosis not present

## 2023-09-17 DIAGNOSIS — M519 Unspecified thoracic, thoracolumbar and lumbosacral intervertebral disc disorder: Secondary | ICD-10-CM | POA: Diagnosis not present

## 2023-09-17 DIAGNOSIS — I10 Essential (primary) hypertension: Secondary | ICD-10-CM | POA: Diagnosis not present

## 2023-09-17 DIAGNOSIS — H9191 Unspecified hearing loss, right ear: Secondary | ICD-10-CM | POA: Diagnosis not present

## 2023-09-17 DIAGNOSIS — M810 Age-related osteoporosis without current pathological fracture: Secondary | ICD-10-CM | POA: Diagnosis not present

## 2023-09-18 DIAGNOSIS — L4050 Arthropathic psoriasis, unspecified: Secondary | ICD-10-CM | POA: Diagnosis not present

## 2023-09-18 DIAGNOSIS — H9191 Unspecified hearing loss, right ear: Secondary | ICD-10-CM | POA: Diagnosis not present

## 2023-09-18 DIAGNOSIS — E119 Type 2 diabetes mellitus without complications: Secondary | ICD-10-CM | POA: Diagnosis not present

## 2023-09-18 DIAGNOSIS — M810 Age-related osteoporosis without current pathological fracture: Secondary | ICD-10-CM | POA: Diagnosis not present

## 2023-09-18 DIAGNOSIS — M519 Unspecified thoracic, thoracolumbar and lumbosacral intervertebral disc disorder: Secondary | ICD-10-CM | POA: Diagnosis not present

## 2023-09-18 DIAGNOSIS — I10 Essential (primary) hypertension: Secondary | ICD-10-CM | POA: Diagnosis not present

## 2023-09-19 DIAGNOSIS — E042 Nontoxic multinodular goiter: Secondary | ICD-10-CM | POA: Diagnosis not present

## 2023-09-19 DIAGNOSIS — E0520 Thyrotoxicosis with toxic multinodular goiter without thyrotoxic crisis or storm: Secondary | ICD-10-CM | POA: Diagnosis not present

## 2023-09-22 DIAGNOSIS — H9191 Unspecified hearing loss, right ear: Secondary | ICD-10-CM | POA: Diagnosis not present

## 2023-09-22 DIAGNOSIS — M810 Age-related osteoporosis without current pathological fracture: Secondary | ICD-10-CM | POA: Diagnosis not present

## 2023-09-22 DIAGNOSIS — I10 Essential (primary) hypertension: Secondary | ICD-10-CM | POA: Diagnosis not present

## 2023-09-22 DIAGNOSIS — M519 Unspecified thoracic, thoracolumbar and lumbosacral intervertebral disc disorder: Secondary | ICD-10-CM | POA: Diagnosis not present

## 2023-09-22 DIAGNOSIS — L4050 Arthropathic psoriasis, unspecified: Secondary | ICD-10-CM | POA: Diagnosis not present

## 2023-09-22 DIAGNOSIS — E119 Type 2 diabetes mellitus without complications: Secondary | ICD-10-CM | POA: Diagnosis not present

## 2023-09-25 DIAGNOSIS — Z8249 Family history of ischemic heart disease and other diseases of the circulatory system: Secondary | ICD-10-CM | POA: Diagnosis not present

## 2023-09-25 DIAGNOSIS — L4050 Arthropathic psoriasis, unspecified: Secondary | ICD-10-CM | POA: Diagnosis not present

## 2023-09-25 DIAGNOSIS — E119 Type 2 diabetes mellitus without complications: Secondary | ICD-10-CM | POA: Diagnosis not present

## 2023-09-25 DIAGNOSIS — M519 Unspecified thoracic, thoracolumbar and lumbosacral intervertebral disc disorder: Secondary | ICD-10-CM | POA: Diagnosis not present

## 2023-09-25 DIAGNOSIS — H9191 Unspecified hearing loss, right ear: Secondary | ICD-10-CM | POA: Diagnosis not present

## 2023-09-25 DIAGNOSIS — R600 Localized edema: Secondary | ICD-10-CM | POA: Diagnosis not present

## 2023-09-25 DIAGNOSIS — I10 Essential (primary) hypertension: Secondary | ICD-10-CM | POA: Diagnosis not present

## 2023-09-25 DIAGNOSIS — M810 Age-related osteoporosis without current pathological fracture: Secondary | ICD-10-CM | POA: Diagnosis not present

## 2023-09-25 LAB — BASIC METABOLIC PANEL
BUN/Creatinine Ratio: 20 (ref 12–28)
BUN: 14 mg/dL (ref 8–27)
CO2: 24 mmol/L (ref 20–29)
Calcium: 9.6 mg/dL (ref 8.7–10.3)
Chloride: 104 mmol/L (ref 96–106)
Creatinine, Ser: 0.69 mg/dL (ref 0.57–1.00)
Glucose: 105 mg/dL — ABNORMAL HIGH (ref 70–99)
Potassium: 4.2 mmol/L (ref 3.5–5.2)
Sodium: 143 mmol/L (ref 134–144)
eGFR: 84 mL/min/{1.73_m2} (ref >59)

## 2023-09-26 ENCOUNTER — Encounter (HOSPITAL_BASED_OUTPATIENT_CLINIC_OR_DEPARTMENT_OTHER): Payer: Self-pay

## 2023-09-29 ENCOUNTER — Other Ambulatory Visit: Payer: Self-pay | Admitting: Internal Medicine

## 2023-09-29 ENCOUNTER — Telehealth (HOSPITAL_BASED_OUTPATIENT_CLINIC_OR_DEPARTMENT_OTHER): Payer: Self-pay | Admitting: Family

## 2023-09-29 DIAGNOSIS — M519 Unspecified thoracic, thoracolumbar and lumbosacral intervertebral disc disorder: Secondary | ICD-10-CM | POA: Diagnosis not present

## 2023-09-29 DIAGNOSIS — L4050 Arthropathic psoriasis, unspecified: Secondary | ICD-10-CM | POA: Diagnosis not present

## 2023-09-29 DIAGNOSIS — E119 Type 2 diabetes mellitus without complications: Secondary | ICD-10-CM | POA: Diagnosis not present

## 2023-09-29 DIAGNOSIS — I10 Essential (primary) hypertension: Secondary | ICD-10-CM | POA: Diagnosis not present

## 2023-09-29 DIAGNOSIS — H9191 Unspecified hearing loss, right ear: Secondary | ICD-10-CM | POA: Diagnosis not present

## 2023-09-29 DIAGNOSIS — M810 Age-related osteoporosis without current pathological fracture: Secondary | ICD-10-CM | POA: Diagnosis not present

## 2023-09-29 DIAGNOSIS — E042 Nontoxic multinodular goiter: Secondary | ICD-10-CM

## 2023-09-29 NOTE — Telephone Encounter (Signed)
 Follow Up:     Patient said she would like her lab results. I told her that they were mailed. She said she need them, so she will know how to take her medicine please.[name] Kitchen

## 2023-09-29 NOTE — Telephone Encounter (Signed)
 Called and spoke to patient. DOB verified.  Patient's concerns:  - would like to know lab results esp. potassium levels from last week.  Nurse's Recommendations:  - informed pt that lab results were mailed to her & lab results were normal.  Patient verbalized understanding and was thankful for call.

## 2023-10-02 DIAGNOSIS — H353133 Nonexudative age-related macular degeneration, bilateral, advanced atrophic without subfoveal involvement: Secondary | ICD-10-CM | POA: Diagnosis not present

## 2023-10-02 DIAGNOSIS — H43813 Vitreous degeneration, bilateral: Secondary | ICD-10-CM | POA: Diagnosis not present

## 2023-10-08 DIAGNOSIS — H9191 Unspecified hearing loss, right ear: Secondary | ICD-10-CM | POA: Diagnosis not present

## 2023-10-08 DIAGNOSIS — I10 Essential (primary) hypertension: Secondary | ICD-10-CM | POA: Diagnosis not present

## 2023-10-08 DIAGNOSIS — Z79899 Other long term (current) drug therapy: Secondary | ICD-10-CM | POA: Diagnosis not present

## 2023-10-08 DIAGNOSIS — Z556 Problems related to health literacy: Secondary | ICD-10-CM | POA: Diagnosis not present

## 2023-10-08 DIAGNOSIS — Z9181 History of falling: Secondary | ICD-10-CM | POA: Diagnosis not present

## 2023-10-08 DIAGNOSIS — M810 Age-related osteoporosis without current pathological fracture: Secondary | ICD-10-CM | POA: Diagnosis not present

## 2023-10-08 DIAGNOSIS — M130 Polyarthritis, unspecified: Secondary | ICD-10-CM | POA: Diagnosis not present

## 2023-10-08 DIAGNOSIS — Z6836 Body mass index (BMI) 36.0-36.9, adult: Secondary | ICD-10-CM | POA: Diagnosis not present

## 2023-10-08 DIAGNOSIS — Z87891 Personal history of nicotine dependence: Secondary | ICD-10-CM | POA: Diagnosis not present

## 2023-10-08 DIAGNOSIS — H353191 Nonexudative age-related macular degeneration, unspecified eye, early dry stage: Secondary | ICD-10-CM | POA: Diagnosis not present

## 2023-10-08 DIAGNOSIS — K5790 Diverticulosis of intestine, part unspecified, without perforation or abscess without bleeding: Secondary | ICD-10-CM | POA: Diagnosis not present

## 2023-10-08 DIAGNOSIS — I350 Nonrheumatic aortic (valve) stenosis: Secondary | ICD-10-CM | POA: Diagnosis not present

## 2023-10-08 DIAGNOSIS — E119 Type 2 diabetes mellitus without complications: Secondary | ICD-10-CM | POA: Diagnosis not present

## 2023-10-08 DIAGNOSIS — R350 Frequency of micturition: Secondary | ICD-10-CM | POA: Diagnosis not present

## 2023-10-08 DIAGNOSIS — M519 Unspecified thoracic, thoracolumbar and lumbosacral intervertebral disc disorder: Secondary | ICD-10-CM | POA: Diagnosis not present

## 2023-10-08 DIAGNOSIS — Z7982 Long term (current) use of aspirin: Secondary | ICD-10-CM | POA: Diagnosis not present

## 2023-10-08 DIAGNOSIS — Z87442 Personal history of urinary calculi: Secondary | ICD-10-CM | POA: Diagnosis not present

## 2023-10-08 DIAGNOSIS — L4050 Arthropathic psoriasis, unspecified: Secondary | ICD-10-CM | POA: Diagnosis not present

## 2023-10-08 DIAGNOSIS — Z604 Social exclusion and rejection: Secondary | ICD-10-CM | POA: Diagnosis not present

## 2023-10-08 DIAGNOSIS — E669 Obesity, unspecified: Secondary | ICD-10-CM | POA: Diagnosis not present

## 2023-10-08 DIAGNOSIS — Z9049 Acquired absence of other specified parts of digestive tract: Secondary | ICD-10-CM | POA: Diagnosis not present

## 2023-10-10 DIAGNOSIS — E119 Type 2 diabetes mellitus without complications: Secondary | ICD-10-CM | POA: Diagnosis not present

## 2023-10-10 DIAGNOSIS — L4050 Arthropathic psoriasis, unspecified: Secondary | ICD-10-CM | POA: Diagnosis not present

## 2023-10-10 DIAGNOSIS — I10 Essential (primary) hypertension: Secondary | ICD-10-CM | POA: Diagnosis not present

## 2023-10-10 DIAGNOSIS — H9191 Unspecified hearing loss, right ear: Secondary | ICD-10-CM | POA: Diagnosis not present

## 2023-10-10 DIAGNOSIS — M519 Unspecified thoracic, thoracolumbar and lumbosacral intervertebral disc disorder: Secondary | ICD-10-CM | POA: Diagnosis not present

## 2023-10-10 DIAGNOSIS — M810 Age-related osteoporosis without current pathological fracture: Secondary | ICD-10-CM | POA: Diagnosis not present

## 2023-10-13 DIAGNOSIS — E119 Type 2 diabetes mellitus without complications: Secondary | ICD-10-CM | POA: Diagnosis not present

## 2023-10-13 DIAGNOSIS — I350 Nonrheumatic aortic (valve) stenosis: Secondary | ICD-10-CM | POA: Diagnosis not present

## 2023-10-13 DIAGNOSIS — M519 Unspecified thoracic, thoracolumbar and lumbosacral intervertebral disc disorder: Secondary | ICD-10-CM | POA: Diagnosis not present

## 2023-10-13 DIAGNOSIS — H9191 Unspecified hearing loss, right ear: Secondary | ICD-10-CM | POA: Diagnosis not present

## 2023-10-13 DIAGNOSIS — L4050 Arthropathic psoriasis, unspecified: Secondary | ICD-10-CM | POA: Diagnosis not present

## 2023-10-13 DIAGNOSIS — M810 Age-related osteoporosis without current pathological fracture: Secondary | ICD-10-CM | POA: Diagnosis not present

## 2023-10-13 DIAGNOSIS — I10 Essential (primary) hypertension: Secondary | ICD-10-CM | POA: Diagnosis not present

## 2023-10-13 DIAGNOSIS — E1169 Type 2 diabetes mellitus with other specified complication: Secondary | ICD-10-CM | POA: Diagnosis not present

## 2023-10-16 ENCOUNTER — Ambulatory Visit (INDEPENDENT_AMBULATORY_CARE_PROVIDER_SITE_OTHER): Payer: Medicare Other | Admitting: Podiatry

## 2023-10-16 ENCOUNTER — Encounter: Payer: Self-pay | Admitting: Podiatry

## 2023-10-16 DIAGNOSIS — E119 Type 2 diabetes mellitus without complications: Secondary | ICD-10-CM | POA: Diagnosis not present

## 2023-10-16 DIAGNOSIS — M79674 Pain in right toe(s): Secondary | ICD-10-CM | POA: Diagnosis not present

## 2023-10-16 DIAGNOSIS — M79675 Pain in left toe(s): Secondary | ICD-10-CM

## 2023-10-16 DIAGNOSIS — B351 Tinea unguium: Secondary | ICD-10-CM | POA: Diagnosis not present

## 2023-10-16 NOTE — Progress Notes (Signed)
This patient returns to my office for at risk foot care.  This patient requires this care by a professional since this patient will be at risk due to having diabetes.  This patient is unable to cut nails herself since the patient cannot reach her nails.These nails are painful walking and wearing shoes.  .  This patient presents for at risk foot care today.  General Appearance  Alert, conversant and in no acute stress.  Vascular  Dorsalis pedis and posterior tibial  pulses are palpable  bilaterally.  Capillary return is within normal limits  bilaterally. Temperature is within normal limits  bilaterally.  Neurologic  Senn-Weinstein monofilament wire test within normal limits  bilaterally. Muscle power within normal limits bilaterally.  Nails Thick disfigured discolored nails with subungual debris  Hallux nails  B/L.[name] Kitchen No evidence of bacterial infection or drainage bilaterally.  Orthopedic  No limitations of motion  feet .  No crepitus or effusions noted.  No bony pathology or digital deformities noted. ADV 4th toe right foot.  HAV  B/L>  Skin  normotropic skin with no porokeratosis noted bilaterally.  No signs of infections or ulcers noted.     Onychomycosis  Pain in right toes  Pain in left toes  Consent was obtained for treatment procedures.   Mechanical debridement of nails 1-5  bilaterally performed with a nail nipper.  Filed with dremel without incident.    Return office visit  3 months                  Told patient to return for periodic foot care and evaluation due to potential at risk complications.   Helane Gunther DPM

## 2023-10-20 DIAGNOSIS — M519 Unspecified thoracic, thoracolumbar and lumbosacral intervertebral disc disorder: Secondary | ICD-10-CM | POA: Diagnosis not present

## 2023-10-20 DIAGNOSIS — E119 Type 2 diabetes mellitus without complications: Secondary | ICD-10-CM | POA: Diagnosis not present

## 2023-10-20 DIAGNOSIS — I350 Nonrheumatic aortic (valve) stenosis: Secondary | ICD-10-CM | POA: Diagnosis not present

## 2023-10-20 DIAGNOSIS — H9191 Unspecified hearing loss, right ear: Secondary | ICD-10-CM | POA: Diagnosis not present

## 2023-10-20 DIAGNOSIS — M810 Age-related osteoporosis without current pathological fracture: Secondary | ICD-10-CM | POA: Diagnosis not present

## 2023-10-20 DIAGNOSIS — I10 Essential (primary) hypertension: Secondary | ICD-10-CM | POA: Diagnosis not present

## 2023-10-20 DIAGNOSIS — E1169 Type 2 diabetes mellitus with other specified complication: Secondary | ICD-10-CM | POA: Diagnosis not present

## 2023-10-20 DIAGNOSIS — L4050 Arthropathic psoriasis, unspecified: Secondary | ICD-10-CM | POA: Diagnosis not present

## 2023-10-27 DIAGNOSIS — E119 Type 2 diabetes mellitus without complications: Secondary | ICD-10-CM | POA: Diagnosis not present

## 2023-10-27 DIAGNOSIS — M519 Unspecified thoracic, thoracolumbar and lumbosacral intervertebral disc disorder: Secondary | ICD-10-CM | POA: Diagnosis not present

## 2023-10-27 DIAGNOSIS — L4050 Arthropathic psoriasis, unspecified: Secondary | ICD-10-CM | POA: Diagnosis not present

## 2023-10-27 DIAGNOSIS — M810 Age-related osteoporosis without current pathological fracture: Secondary | ICD-10-CM | POA: Diagnosis not present

## 2023-10-27 DIAGNOSIS — I10 Essential (primary) hypertension: Secondary | ICD-10-CM | POA: Diagnosis not present

## 2023-10-27 DIAGNOSIS — H9191 Unspecified hearing loss, right ear: Secondary | ICD-10-CM | POA: Diagnosis not present

## 2023-11-05 DIAGNOSIS — R1032 Left lower quadrant pain: Secondary | ICD-10-CM | POA: Diagnosis not present

## 2023-11-05 DIAGNOSIS — R109 Unspecified abdominal pain: Secondary | ICD-10-CM | POA: Diagnosis not present

## 2023-11-06 DIAGNOSIS — L4050 Arthropathic psoriasis, unspecified: Secondary | ICD-10-CM | POA: Diagnosis not present

## 2023-11-06 DIAGNOSIS — H9191 Unspecified hearing loss, right ear: Secondary | ICD-10-CM | POA: Diagnosis not present

## 2023-11-06 DIAGNOSIS — M519 Unspecified thoracic, thoracolumbar and lumbosacral intervertebral disc disorder: Secondary | ICD-10-CM | POA: Diagnosis not present

## 2023-11-06 DIAGNOSIS — E119 Type 2 diabetes mellitus without complications: Secondary | ICD-10-CM | POA: Diagnosis not present

## 2023-11-06 DIAGNOSIS — M810 Age-related osteoporosis without current pathological fracture: Secondary | ICD-10-CM | POA: Diagnosis not present

## 2023-11-06 DIAGNOSIS — I10 Essential (primary) hypertension: Secondary | ICD-10-CM | POA: Diagnosis not present

## 2023-11-11 DIAGNOSIS — E1169 Type 2 diabetes mellitus with other specified complication: Secondary | ICD-10-CM | POA: Diagnosis not present

## 2023-11-11 DIAGNOSIS — I350 Nonrheumatic aortic (valve) stenosis: Secondary | ICD-10-CM | POA: Diagnosis not present

## 2023-11-11 DIAGNOSIS — I10 Essential (primary) hypertension: Secondary | ICD-10-CM | POA: Diagnosis not present

## 2023-11-12 ENCOUNTER — Other Ambulatory Visit

## 2023-11-13 DIAGNOSIS — H43813 Vitreous degeneration, bilateral: Secondary | ICD-10-CM | POA: Diagnosis not present

## 2023-11-13 DIAGNOSIS — H353133 Nonexudative age-related macular degeneration, bilateral, advanced atrophic without subfoveal involvement: Secondary | ICD-10-CM | POA: Diagnosis not present

## 2023-11-19 DIAGNOSIS — I10 Essential (primary) hypertension: Secondary | ICD-10-CM | POA: Diagnosis not present

## 2023-11-19 DIAGNOSIS — I350 Nonrheumatic aortic (valve) stenosis: Secondary | ICD-10-CM | POA: Diagnosis not present

## 2023-11-19 DIAGNOSIS — M810 Age-related osteoporosis without current pathological fracture: Secondary | ICD-10-CM | POA: Diagnosis not present

## 2023-11-19 DIAGNOSIS — E1169 Type 2 diabetes mellitus with other specified complication: Secondary | ICD-10-CM | POA: Diagnosis not present

## 2023-11-24 DIAGNOSIS — Z6837 Body mass index (BMI) 37.0-37.9, adult: Secondary | ICD-10-CM | POA: Diagnosis not present

## 2023-11-24 DIAGNOSIS — E669 Obesity, unspecified: Secondary | ICD-10-CM | POA: Diagnosis not present

## 2023-11-24 DIAGNOSIS — M1991 Primary osteoarthritis, unspecified site: Secondary | ICD-10-CM | POA: Diagnosis not present

## 2023-11-24 DIAGNOSIS — M810 Age-related osteoporosis without current pathological fracture: Secondary | ICD-10-CM | POA: Diagnosis not present

## 2023-11-24 DIAGNOSIS — M7989 Other specified soft tissue disorders: Secondary | ICD-10-CM | POA: Diagnosis not present

## 2023-11-24 DIAGNOSIS — R7989 Other specified abnormal findings of blood chemistry: Secondary | ICD-10-CM | POA: Diagnosis not present

## 2023-11-24 DIAGNOSIS — R600 Localized edema: Secondary | ICD-10-CM | POA: Diagnosis not present

## 2023-11-24 DIAGNOSIS — L4059 Other psoriatic arthropathy: Secondary | ICD-10-CM | POA: Diagnosis not present

## 2023-11-24 DIAGNOSIS — R5382 Chronic fatigue, unspecified: Secondary | ICD-10-CM | POA: Diagnosis not present

## 2023-11-26 ENCOUNTER — Other Ambulatory Visit: Payer: Self-pay | Admitting: Internal Medicine

## 2023-11-26 ENCOUNTER — Ambulatory Visit
Admission: RE | Admit: 2023-11-26 | Discharge: 2023-11-26 | Disposition: A | Discharge status: Home / Self Care | Source: Ambulatory Visit | Attending: Internal Medicine | Admitting: Internal Medicine

## 2023-11-26 DIAGNOSIS — E042 Nontoxic multinodular goiter: Secondary | ICD-10-CM | POA: Diagnosis not present

## 2023-12-11 DIAGNOSIS — I350 Nonrheumatic aortic (valve) stenosis: Secondary | ICD-10-CM | POA: Diagnosis not present

## 2023-12-11 DIAGNOSIS — I10 Essential (primary) hypertension: Secondary | ICD-10-CM | POA: Diagnosis not present

## 2023-12-11 DIAGNOSIS — E1169 Type 2 diabetes mellitus with other specified complication: Secondary | ICD-10-CM | POA: Diagnosis not present

## 2023-12-18 ENCOUNTER — Ambulatory Visit (HOSPITAL_BASED_OUTPATIENT_CLINIC_OR_DEPARTMENT_OTHER): Payer: Self-pay | Admitting: Family

## 2023-12-18 ENCOUNTER — Ambulatory Visit (HOSPITAL_BASED_OUTPATIENT_CLINIC_OR_DEPARTMENT_OTHER): Payer: Medicare Other

## 2023-12-18 DIAGNOSIS — I350 Nonrheumatic aortic (valve) stenosis: Secondary | ICD-10-CM | POA: Diagnosis not present

## 2023-12-18 DIAGNOSIS — R600 Localized edema: Secondary | ICD-10-CM | POA: Diagnosis not present

## 2023-12-18 DIAGNOSIS — I10 Essential (primary) hypertension: Secondary | ICD-10-CM | POA: Diagnosis not present

## 2023-12-18 LAB — ECHOCARDIOGRAM COMPLETE
AR max vel: 1.3 cm2
AV Area VTI: 1.29 cm2
AV Area mean vel: 1.22 cm2
AV Mean grad: 14 mmHg
AV Peak grad: 25.6 mmHg
Ao pk vel: 2.53 m/s
Area-P 1/2: 3.53 cm2
S' Lateral: 2.32 cm

## 2023-12-20 DIAGNOSIS — M810 Age-related osteoporosis without current pathological fracture: Secondary | ICD-10-CM | POA: Diagnosis not present

## 2023-12-20 DIAGNOSIS — E1169 Type 2 diabetes mellitus with other specified complication: Secondary | ICD-10-CM | POA: Diagnosis not present

## 2023-12-20 DIAGNOSIS — I350 Nonrheumatic aortic (valve) stenosis: Secondary | ICD-10-CM | POA: Diagnosis not present

## 2023-12-20 DIAGNOSIS — I10 Essential (primary) hypertension: Secondary | ICD-10-CM | POA: Diagnosis not present

## 2023-12-31 DIAGNOSIS — H43813 Vitreous degeneration, bilateral: Secondary | ICD-10-CM | POA: Diagnosis not present

## 2023-12-31 DIAGNOSIS — T1581XA Foreign body in other and multiple parts of external eye, right eye, initial encounter: Secondary | ICD-10-CM | POA: Diagnosis not present

## 2023-12-31 DIAGNOSIS — H353133 Nonexudative age-related macular degeneration, bilateral, advanced atrophic without subfoveal involvement: Secondary | ICD-10-CM | POA: Diagnosis not present

## 2024-01-02 DIAGNOSIS — Z923 Personal history of irradiation: Secondary | ICD-10-CM | POA: Diagnosis not present

## 2024-01-02 DIAGNOSIS — E0520 Thyrotoxicosis with toxic multinodular goiter without thyrotoxic crisis or storm: Secondary | ICD-10-CM | POA: Diagnosis not present

## 2024-01-09 ENCOUNTER — Encounter (HOSPITAL_BASED_OUTPATIENT_CLINIC_OR_DEPARTMENT_OTHER): Payer: Self-pay | Admitting: Cardiology

## 2024-01-09 ENCOUNTER — Ambulatory Visit (HOSPITAL_BASED_OUTPATIENT_CLINIC_OR_DEPARTMENT_OTHER): Payer: Medicare Other | Admitting: Cardiology

## 2024-01-09 VITALS — BP 138/52 | Ht 60.0 in | Wt 189.8 lb

## 2024-01-09 DIAGNOSIS — E782 Mixed hyperlipidemia: Secondary | ICD-10-CM | POA: Diagnosis not present

## 2024-01-09 DIAGNOSIS — I350 Nonrheumatic aortic (valve) stenosis: Secondary | ICD-10-CM

## 2024-01-09 NOTE — Patient Instructions (Signed)

## 2024-01-09 NOTE — Progress Notes (Signed)
 Cardiology Office Note:  .   Date:  01/09/2024  ID:  Lisa [name], DOB June 18, 1936, MRN [id number] PCP: Darnelle Elders, PA-C  [facility] Providers Cardiologist:  Dorothye Gathers, MD    History of Present Illness: Lisa [name]   Lisa [name] is a 88 y.o. female Discussed the use of AI scribe software for clinical note transcription with the patient, who gave verbal consent to proceed.  History of Present Illness Lisa [name] is an 88 year old female with hypertension and psoriatic arthritis who presents for cardiovascular follow-up. She was referred by Dr. Kathyanne Parkers for evaluation of her blood pressure management and cardiovascular status.  She has a history of hypertension and is currently on amlodipine  5 mg daily and losartan  100 mg daily. Her home blood pressure readings typically range from 140 to 150 mmHg, but have been as low as 120/60 mmHg. Her blood pressure was very low during her last visit, leading to a reduction in her medication regimen. She suspects that her blood pressure machine may not always be accurate, as it sometimes reads low due to improper cuff tightness.  She experiences persistent lower extremity edema, managed with furosemide  20 mg daily and potassium supplements. Frequent urination is noted, attributed to diuretic use. Her legs remain swollen and feel weak, which she attributes to her arthritis and swelling. She remains active throughout the day, frequently moving around, and tries to elevate her legs when possible to alleviate swelling.  She underwent back surgery in the past and continues to experience arthritis-related symptoms. She has a history of radioactive iodine ablation for thyroid  issues and recently had her thyroid  evaluated, where her blood pressure was noted to be 120/60 mmHg.  She reports congestion, which she is unsure if it is due to allergies. No significant changes in her condition since her last visit.      Studies Reviewed: .         Results DIAGNOSTIC Echocardiogram: Ejection fraction 70%, mild intracavitary gradient, peak velocity 1.3 m/s, peak gradient 6.9 mmHg, mild aortic valve stenosis (12/18/2023) Risk Assessment/Calculations:            Physical Exam:   VS:  BP (!) 138/52   Ht 5' (1.524 m)   Wt 189 lb 12.8 oz (86.1 kg)   LMP  (LMP Unknown)   SpO2 95%   BMI 37.07 kg/m    Wt Readings from Last 3 Encounters:  01/09/24 189 lb 12.8 oz (86.1 kg)  09/09/23 190 lb 6.4 oz (86.4 kg)  03/05/23 189 lb 8 oz (86 kg)    GEN: Well nourished, well developed in no acute distress NECK: No JVD; No carotid bruits CARDIAC: RRR, 3/6 systolic murmur, no rubs, no gallops RESPIRATORY:  Clear to auscultation without rales, wheezing or rhonchi  ABDOMEN: Soft, non-tender, non-distended EXTREMITIES:  No edema; No deformity   ASSESSMENT AND PLAN: .    Assessment and Plan Assessment & Plan Aortic valve stenosis Mild aortic valve stenosis with a peak velocity of 1.3 m/s and a peak gradient of 6.9 mmHg. The condition has improved from moderate to mild range. No current need for valve replacement.  Hypertension Blood pressure management is well-controlled after discontinuation of losartan  hydrochlorothiazide  due to hypotension. Current regimen includes amlodipine  5 mg daily. Home blood pressure monitoring shows variability, with occasional readings as high as 150. - Continue amlodipine  5 mg daily, furosemide  20 mg every day, losartan  100mg . - Monitor blood pressure at home regularly.  Lower extremity edema Chronic lower  extremity edema, possibly exacerbated by psoriatic arthritis. Managed with furosemide  20 mg daily and potassium supplementation. Advised to avoid tight clothing around ankles and elevate legs when possible. - Continue furosemide  20 mg daily. - Continue potassium supplementation. - Avoid tight clothing around ankles. - Elevate legs when possible.            Signed, Dorothye Gathers, MD

## 2024-01-10 DIAGNOSIS — I350 Nonrheumatic aortic (valve) stenosis: Secondary | ICD-10-CM | POA: Diagnosis not present

## 2024-01-10 DIAGNOSIS — I10 Essential (primary) hypertension: Secondary | ICD-10-CM | POA: Diagnosis not present

## 2024-01-10 DIAGNOSIS — E1169 Type 2 diabetes mellitus with other specified complication: Secondary | ICD-10-CM | POA: Diagnosis not present

## 2024-01-19 ENCOUNTER — Ambulatory Visit (INDEPENDENT_AMBULATORY_CARE_PROVIDER_SITE_OTHER): Admitting: Podiatry

## 2024-01-19 ENCOUNTER — Encounter: Payer: Self-pay | Admitting: Podiatry

## 2024-01-19 DIAGNOSIS — M79675 Pain in left toe(s): Secondary | ICD-10-CM

## 2024-01-19 DIAGNOSIS — E119 Type 2 diabetes mellitus without complications: Secondary | ICD-10-CM | POA: Diagnosis not present

## 2024-01-19 DIAGNOSIS — B351 Tinea unguium: Secondary | ICD-10-CM | POA: Diagnosis not present

## 2024-01-19 DIAGNOSIS — L84 Corns and callosities: Secondary | ICD-10-CM | POA: Diagnosis not present

## 2024-01-19 DIAGNOSIS — M79674 Pain in right toe(s): Secondary | ICD-10-CM

## 2024-01-19 NOTE — Progress Notes (Signed)
 This patient returns to my office for at risk foot care.  This patient requires this care by a professional since this patient will be at risk due to having diabetes.  This patient is unable to cut nails herself since the patient cannot reach her nails.These nails are painful walking and wearing shoes.  .Painful callus left big toe.  This patient presents for at risk foot care today.  General Appearance  Alert, conversant and in no acute stress.  Vascular  Dorsalis pedis and posterior tibial  pulses are palpable  bilaterally.  Capillary return is within normal limits  bilaterally. Temperature is within normal limits  bilaterally.  Neurologic  Senn-Weinstein monofilament wire test within normal limits  bilaterally. Muscle power within normal limits bilaterally.  Nails Thick disfigured discolored nails with subungual debris  Hallux nails  B/L.[name] No evidence of bacterial infection or drainage bilaterally.  Orthopedic  No limitations of motion  feet .  No crepitus or effusions noted.  No bony pathology or digital deformities noted. ADV 4th toe right foot.  HAV  B/L>  Skin  normotropic skin with no porokeratosis noted bilaterally.  No signs of infections or ulcers noted.  Pinch callus left hallux.   Onychomycosis  Pain in right toes  Pain in left toes  Pinch callus left foot.  Consent was obtained for treatment procedures.   Mechanical debridement of nails 1-5  bilaterally performed with a nail nipper.  Filed with dremel without incident.    Return office visit  3 months                  Told patient to return for periodic foot care and evaluation due to potential at risk complications.   Cordella Bold DPM

## 2024-02-04 ENCOUNTER — Ambulatory Visit: Admitting: Podiatry

## 2024-02-04 ENCOUNTER — Ambulatory Visit (INDEPENDENT_AMBULATORY_CARE_PROVIDER_SITE_OTHER): Admitting: Podiatry

## 2024-02-04 ENCOUNTER — Encounter: Payer: Self-pay | Admitting: Podiatry

## 2024-02-04 DIAGNOSIS — L409 Psoriasis, unspecified: Secondary | ICD-10-CM | POA: Insufficient documentation

## 2024-02-04 DIAGNOSIS — R1310 Dysphagia, unspecified: Secondary | ICD-10-CM | POA: Insufficient documentation

## 2024-02-04 DIAGNOSIS — J302 Other seasonal allergic rhinitis: Secondary | ICD-10-CM | POA: Insufficient documentation

## 2024-02-04 DIAGNOSIS — E119 Type 2 diabetes mellitus without complications: Secondary | ICD-10-CM | POA: Diagnosis not present

## 2024-02-04 DIAGNOSIS — L821 Other seborrheic keratosis: Secondary | ICD-10-CM | POA: Insufficient documentation

## 2024-02-04 DIAGNOSIS — H04123 Dry eye syndrome of bilateral lacrimal glands: Secondary | ICD-10-CM | POA: Insufficient documentation

## 2024-02-04 DIAGNOSIS — L97521 Non-pressure chronic ulcer of other part of left foot limited to breakdown of skin: Secondary | ICD-10-CM | POA: Diagnosis not present

## 2024-02-04 MED ORDER — CLINDAMYCIN HCL 300 MG PO CAPS: 300.0000 mg | ORAL_CAPSULE | Freq: Three times a day (TID) | ORAL | 0 refills | Status: AC

## 2024-02-04 NOTE — Progress Notes (Signed)
  Subjective:  Patient ID: Lisa [name], female    DOB: January 30, 1936,   MRN: [id number]  Chief Complaint  Patient presents with   Toe Pain    Plantar hallux left - callused area x years, just saw Dr. Loreda few weeks ago for nail and callus trim-he did trim the callus, but now tender, some redness, keeping toe cap to protect Patient also states the hallux right is sore as well-thinks there is a callus starting but nothing visible     88 y.o. female presents for concern of red painful right great toe. She relates this started a few weeks ago.  She regularly sees Dr. Loreda for rfc. After last visit notices some tenderness and redness around the eft great toe. Has been keeping a cap on for protection. Also relates some tenderness on the left great toe. Relates burning and tingling in their feet. Patient is diabetic and last A1c was No results found for: HGBA1C .   PCP:  Katina Pfeiffer, PA-C     . Denies any other pedal complaints. Denies n/v/f/c.   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 CFT <3 seconds. Absent hair growth on digits. Edema noted to bilateral lower extremities. Xerosis noted bilaterally.  Skin. No lacerations or abrasions bilateral feet. Plantar left hallux hyperkeratosis upon debridement open ulceration superficial noted with some mild erythema and edmea surrounding area.  Musculoskeletal: MMT 5/5 bilateral lower extremities in DF, PF, Inversion  and Eversion. Deceased ROM in DF of ankle joint.  Neurological: Sensation intact to light touch. Protective sensation diminished bilateral.    Assessment:   1. Ulcer of great toe, left, limited to breakdown of skin (HCC)   2. Diabetes mellitus without complication (HCC)      Plan:  Patient was evaluated and treated and all questions answered. Ulcer plantar left hallux limited to breakdown of skin  -Debridement as below. -Dressed with neosporin , DSD. -Off-loading with toe cap  -Clindamycin  sent to pharmacy  -Discussed glucose control and proper protein-rich diet.  -Discussed if any worsening redness, pain, fever or chills to call or may need to report to the emergency room. Patient expressed understanding.   Procedure: Excisional Debridement of Wound Rationale: Removal of non-viable soft tissue from the wound to promote healing.  Anesthesia: none Pre-Debridement Wound Measurements: Overlying callus  Post-Debridement Wound Measurements: 0.2 cm x 0.3 cm x 0.1 cm  Type of Debridement: Sharp Excisional Tissue Removed: Non-viable soft tissue Depth of Debridement: subcutaneous tissue. Technique: Sharp excisional debridement to bleeding, viable wound base.  Dressing: Dry, sterile, compression dressing. Disposition: Patient tolerated procedure well. Patient to return in 2 week for follow-up.  Return in about 2 weeks (around 02/18/2024) for wound check.   Asberry Failing, DPM

## 2024-02-09 DIAGNOSIS — I350 Nonrheumatic aortic (valve) stenosis: Secondary | ICD-10-CM | POA: Diagnosis not present

## 2024-02-09 DIAGNOSIS — I10 Essential (primary) hypertension: Secondary | ICD-10-CM | POA: Diagnosis not present

## 2024-02-09 DIAGNOSIS — E1169 Type 2 diabetes mellitus with other specified complication: Secondary | ICD-10-CM | POA: Diagnosis not present

## 2024-02-12 DIAGNOSIS — T1581XA Foreign body in other and multiple parts of external eye, right eye, initial encounter: Secondary | ICD-10-CM | POA: Diagnosis not present

## 2024-02-12 DIAGNOSIS — H353113 Nonexudative age-related macular degeneration, right eye, advanced atrophic without subfoveal involvement: Secondary | ICD-10-CM | POA: Diagnosis not present

## 2024-02-12 DIAGNOSIS — H43813 Vitreous degeneration, bilateral: Secondary | ICD-10-CM | POA: Diagnosis not present

## 2024-02-12 DIAGNOSIS — H353123 Nonexudative age-related macular degeneration, left eye, advanced atrophic without subfoveal involvement: Secondary | ICD-10-CM | POA: Diagnosis not present

## 2024-02-18 ENCOUNTER — Encounter: Payer: Self-pay | Admitting: Podiatry

## 2024-02-18 ENCOUNTER — Ambulatory Visit (INDEPENDENT_AMBULATORY_CARE_PROVIDER_SITE_OTHER): Admitting: Podiatry

## 2024-02-18 VITALS — BP 156/52 | HR 65

## 2024-02-18 DIAGNOSIS — E119 Type 2 diabetes mellitus without complications: Secondary | ICD-10-CM

## 2024-02-18 DIAGNOSIS — L84 Corns and callosities: Secondary | ICD-10-CM | POA: Diagnosis not present

## 2024-02-18 NOTE — Progress Notes (Signed)
  Subjective:  Patient ID: Lisa [name], female    DOB: 04/19/1936,   MRN: [id number]  Chief Complaint  Patient presents with   Wound Check    It's still sore but not as sore as it was before.    88 y.o. female presents for follow-up of left hallux wound. Relates doing ok and not as sore.  Patient is diabetic and last A1c was No results found for: HGBA1C .   PCP:  Katina Pfeiffer, PA-C     . Denies any other pedal complaints. Denies n/v/f/c.   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 CFT <3 seconds. Absent hair growth on digits. Edema noted to bilateral lower extremities. Xerosis noted bilaterally.  Skin. No lacerations or abrasions bilateral feet. Plantar left hallux hyperkeratosis upon debridement ulceration healed.  Musculoskeletal: MMT 5/5 bilateral lower extremities in DF, PF, Inversion and Eversion. Deceased ROM in DF of ankle joint.  Neurological: Sensation intact to light touch. Protective sensation diminished bilateral.    Assessment:   1. Pre-ulcerative calluses   2. Diabetes mellitus without complication (HCC)       Plan:  Patient was evaluated and treated and all questions answered. Ulcer plantar left hallux healed  -Debridement as below with underlying ulceration healed as courtesy.  -Off-loading with toe cap  -No abx necessary today.  -Discussed glucose control and proper protein-rich diet.  -Discussed if any worsening  redness, pain, fever or chills to call or may need to report to the emergency room. Patient expressed understanding.   Return in 4 weeks for recheck.   Return in about 4 weeks (around 03/17/2024) for wound check.   Asberry Failing, DPM

## 2024-02-19 DIAGNOSIS — M810 Age-related osteoporosis without current pathological fracture: Secondary | ICD-10-CM | POA: Diagnosis not present

## 2024-02-19 DIAGNOSIS — E1169 Type 2 diabetes mellitus with other specified complication: Secondary | ICD-10-CM | POA: Diagnosis not present

## 2024-02-19 DIAGNOSIS — I10 Essential (primary) hypertension: Secondary | ICD-10-CM | POA: Diagnosis not present

## 2024-02-19 DIAGNOSIS — I350 Nonrheumatic aortic (valve) stenosis: Secondary | ICD-10-CM | POA: Diagnosis not present

## 2024-02-23 DIAGNOSIS — E1169 Type 2 diabetes mellitus with other specified complication: Secondary | ICD-10-CM | POA: Diagnosis not present

## 2024-02-23 DIAGNOSIS — Z6835 Body mass index (BMI) 35.0-35.9, adult: Secondary | ICD-10-CM | POA: Diagnosis not present

## 2024-02-23 DIAGNOSIS — L4050 Arthropathic psoriasis, unspecified: Secondary | ICD-10-CM | POA: Diagnosis not present

## 2024-02-23 DIAGNOSIS — I350 Nonrheumatic aortic (valve) stenosis: Secondary | ICD-10-CM | POA: Diagnosis not present

## 2024-02-23 DIAGNOSIS — Z923 Personal history of irradiation: Secondary | ICD-10-CM | POA: Diagnosis not present

## 2024-02-23 DIAGNOSIS — E0520 Thyrotoxicosis with toxic multinodular goiter without thyrotoxic crisis or storm: Secondary | ICD-10-CM | POA: Diagnosis not present

## 2024-02-23 DIAGNOSIS — M810 Age-related osteoporosis without current pathological fracture: Secondary | ICD-10-CM | POA: Diagnosis not present

## 2024-02-23 DIAGNOSIS — E559 Vitamin D deficiency, unspecified: Secondary | ICD-10-CM | POA: Diagnosis not present

## 2024-02-23 DIAGNOSIS — E782 Mixed hyperlipidemia: Secondary | ICD-10-CM | POA: Diagnosis not present

## 2024-02-23 DIAGNOSIS — R350 Frequency of micturition: Secondary | ICD-10-CM | POA: Diagnosis not present

## 2024-02-23 DIAGNOSIS — I10 Essential (primary) hypertension: Secondary | ICD-10-CM | POA: Diagnosis not present

## 2024-02-23 DIAGNOSIS — Z0000 Encounter for general adult medical examination without abnormal findings: Secondary | ICD-10-CM | POA: Diagnosis not present

## 2024-02-23 DIAGNOSIS — E0590 Thyrotoxicosis, unspecified without thyrotoxic crisis or storm: Secondary | ICD-10-CM | POA: Diagnosis not present

## 2024-03-01 DIAGNOSIS — J3489 Other specified disorders of nose and nasal sinuses: Secondary | ICD-10-CM | POA: Diagnosis not present

## 2024-03-01 DIAGNOSIS — I10 Essential (primary) hypertension: Secondary | ICD-10-CM | POA: Diagnosis not present

## 2024-03-01 DIAGNOSIS — Z6835 Body mass index (BMI) 35.0-35.9, adult: Secondary | ICD-10-CM | POA: Diagnosis not present

## 2024-03-05 DIAGNOSIS — M810 Age-related osteoporosis without current pathological fracture: Secondary | ICD-10-CM | POA: Diagnosis not present

## 2024-03-05 DIAGNOSIS — E669 Obesity, unspecified: Secondary | ICD-10-CM | POA: Diagnosis not present

## 2024-03-05 DIAGNOSIS — Z6835 Body mass index (BMI) 35.0-35.9, adult: Secondary | ICD-10-CM | POA: Diagnosis not present

## 2024-03-05 DIAGNOSIS — M1991 Primary osteoarthritis, unspecified site: Secondary | ICD-10-CM | POA: Diagnosis not present

## 2024-03-05 DIAGNOSIS — R5382 Chronic fatigue, unspecified: Secondary | ICD-10-CM | POA: Diagnosis not present

## 2024-03-05 DIAGNOSIS — L4059 Other psoriatic arthropathy: Secondary | ICD-10-CM | POA: Diagnosis not present

## 2024-03-05 DIAGNOSIS — E041 Nontoxic single thyroid nodule: Secondary | ICD-10-CM | POA: Diagnosis not present

## 2024-03-05 DIAGNOSIS — R600 Localized edema: Secondary | ICD-10-CM | POA: Diagnosis not present

## 2024-03-05 DIAGNOSIS — M7989 Other specified soft tissue disorders: Secondary | ICD-10-CM | POA: Diagnosis not present

## 2024-03-05 DIAGNOSIS — R7989 Other specified abnormal findings of blood chemistry: Secondary | ICD-10-CM | POA: Diagnosis not present

## 2024-03-09 ENCOUNTER — Telehealth: Payer: Self-pay | Admitting: Cardiology

## 2024-03-09 ENCOUNTER — Other Ambulatory Visit: Payer: Self-pay

## 2024-03-09 MED ORDER — POTASSIUM CHLORIDE CRYS ER 10 MEQ PO TBCR: 10.0000 meq | EXTENDED_RELEASE_TABLET | Freq: Every day | ORAL | 1 refills | Status: DC

## 2024-03-09 MED ORDER — AMLODIPINE BESYLATE 5 MG PO TABS: 5.0000 mg | ORAL_TABLET | Freq: Every day | ORAL | 1 refills | Status: DC

## 2024-03-09 NOTE — Telephone Encounter (Signed)
 RX sent in

## 2024-03-09 NOTE — Telephone Encounter (Signed)
*  STAT* If patient is at the pharmacy, call can be transferred to refill team.   1. Which medications need to be refilled? (please list name of each medication and dose if known)   amLODipine  (NORVASC ) 5 MG tablet    potassium chloride  (KLOR-CON  M) 10 MEQ tablet    2. Which pharmacy/location (including street and city if local pharmacy) is medication to be sent to?  [id number] - [place], [place] - [address]      3. Do they need a 30 day or 90 day supply? 90 day

## 2024-03-10 DIAGNOSIS — I10 Essential (primary) hypertension: Secondary | ICD-10-CM | POA: Diagnosis not present

## 2024-03-10 DIAGNOSIS — E1169 Type 2 diabetes mellitus with other specified complication: Secondary | ICD-10-CM | POA: Diagnosis not present

## 2024-03-10 DIAGNOSIS — I350 Nonrheumatic aortic (valve) stenosis: Secondary | ICD-10-CM | POA: Diagnosis not present

## 2024-03-17 ENCOUNTER — Ambulatory Visit (INDEPENDENT_AMBULATORY_CARE_PROVIDER_SITE_OTHER): Admitting: Podiatry

## 2024-03-17 ENCOUNTER — Encounter: Payer: Self-pay | Admitting: Podiatry

## 2024-03-17 VITALS — BP 143/50 | HR 76 | Temp 97.2°F

## 2024-03-17 DIAGNOSIS — L84 Corns and callosities: Secondary | ICD-10-CM

## 2024-03-17 DIAGNOSIS — E119 Type 2 diabetes mellitus without complications: Secondary | ICD-10-CM | POA: Diagnosis not present

## 2024-03-17 NOTE — Progress Notes (Signed)
  Subjective:  Patient ID: Lisa [name], female    DOB: [birth year],   MRN: [id number]  Chief Complaint  Patient presents with   Diabetes    It's doing okay, it's still a little sore.  I've kept a bandaid on it since I've been here.    88 y.o. female presents for follow-up of left hallux wound. Relates doing ok and not as sore.  Patient is diabetic and last A1c was No results found for: HGBA1C .   PCP:  Katina Pfeiffer, PA-C     . Denies any other pedal complaints. Denies n/v/f/c.   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 CFT <3 seconds. Absent hair growth on digits. Edema noted to bilateral lower extremities. Xerosis noted bilaterally.  Skin. No lacerations or abrasions bilateral feet. Plantar left hallux hyperkeratosis upon debridement ulceration healed. Nails 1-5 bilateral are thickened elongated and dystrophic.  Musculoskeletal: MMT 5/5 bilateral lower extremities in DF, PF, Inversion and Eversion. Deceased ROM in DF of ankle joint.  Neurological: Sensation intact to light touch. Protective sensation diminished bilateral.    Assessment:   1. Pre-ulcerative calluses   2. Diabetes mellitus without complication (HCC)        Plan:  Patient was evaluated and treated and all questions answered. Ulcer plantar left hallux healed  -Debridement as below with underlying ulceration healed  -Discussed and educated patient on diabetic foot care,  especially with  regards to the vascular, neurological and musculoskeletal systems.  -Stressed the importance of good glycemic control and the detriment of not  controlling glucose levels in relation to the foot. -Discussed supportive shoes at all times and checking feet regularly.  -Answered all patient questions -Patient to return  as scheduled for rfc.  -Patient advised to call the office if any problems or questions arise in the meantime.   Return if symptoms worsen or fail to improve.   Asberry Failing, DPM

## 2024-03-21 DIAGNOSIS — I350 Nonrheumatic aortic (valve) stenosis: Secondary | ICD-10-CM | POA: Diagnosis not present

## 2024-03-21 DIAGNOSIS — I10 Essential (primary) hypertension: Secondary | ICD-10-CM | POA: Diagnosis not present

## 2024-03-21 DIAGNOSIS — M810 Age-related osteoporosis without current pathological fracture: Secondary | ICD-10-CM | POA: Diagnosis not present

## 2024-03-21 DIAGNOSIS — E1169 Type 2 diabetes mellitus with other specified complication: Secondary | ICD-10-CM | POA: Diagnosis not present

## 2024-03-24 DIAGNOSIS — H90A22 Sensorineural hearing loss, unilateral, left ear, with restricted hearing on the contralateral side: Secondary | ICD-10-CM | POA: Diagnosis not present

## 2024-03-31 DIAGNOSIS — I10 Essential (primary) hypertension: Secondary | ICD-10-CM | POA: Diagnosis not present

## 2024-03-31 DIAGNOSIS — R350 Frequency of micturition: Secondary | ICD-10-CM | POA: Diagnosis not present

## 2024-03-31 DIAGNOSIS — Z6834 Body mass index (BMI) 34.0-34.9, adult: Secondary | ICD-10-CM | POA: Diagnosis not present

## 2024-04-01 DIAGNOSIS — H353123 Nonexudative age-related macular degeneration, left eye, advanced atrophic without subfoveal involvement: Secondary | ICD-10-CM | POA: Diagnosis not present

## 2024-04-01 DIAGNOSIS — T1581XA Foreign body in other and multiple parts of external eye, right eye, initial encounter: Secondary | ICD-10-CM | POA: Diagnosis not present

## 2024-04-01 DIAGNOSIS — H43812 Vitreous degeneration, left eye: Secondary | ICD-10-CM | POA: Diagnosis not present

## 2024-04-01 DIAGNOSIS — H353113 Nonexudative age-related macular degeneration, right eye, advanced atrophic without subfoveal involvement: Secondary | ICD-10-CM | POA: Diagnosis not present

## 2024-04-01 DIAGNOSIS — H43811 Vitreous degeneration, right eye: Secondary | ICD-10-CM | POA: Diagnosis not present

## 2024-04-13 ENCOUNTER — Telehealth: Payer: Self-pay | Admitting: Podiatry

## 2024-04-13 NOTE — Telephone Encounter (Signed)
 Pt left message asking for her appt time next Monday.  Returned call and she is scheduled for 945 on 9/29 and she said thank you

## 2024-04-19 ENCOUNTER — Encounter: Payer: Self-pay | Admitting: Podiatry

## 2024-04-19 ENCOUNTER — Ambulatory Visit: Admitting: Podiatry

## 2024-04-19 DIAGNOSIS — B351 Tinea unguium: Secondary | ICD-10-CM

## 2024-04-19 DIAGNOSIS — M79675 Pain in left toe(s): Secondary | ICD-10-CM | POA: Diagnosis not present

## 2024-04-19 DIAGNOSIS — M79674 Pain in right toe(s): Secondary | ICD-10-CM | POA: Diagnosis not present

## 2024-04-19 DIAGNOSIS — L84 Corns and callosities: Secondary | ICD-10-CM

## 2024-04-19 DIAGNOSIS — E119 Type 2 diabetes mellitus without complications: Secondary | ICD-10-CM

## 2024-04-19 NOTE — Progress Notes (Signed)
 This patient returns to my office for at risk foot care.  This patient requires this care by a professional since this patient will be at risk due to having diabetes.  This patient is unable to cut nails herself since the patient cannot reach her nails.These nails are painful walking and wearing shoes.  .Painful callus left big toe.  This patient presents for at risk foot care today.  General Appearance  Alert, conversant and in no acute stress.  Vascular  Dorsalis pedis and posterior tibial  pulses are palpable  bilaterally.  Capillary return is within normal limits  bilaterally. Temperature is within normal limits  bilaterally.  Neurologic  Senn-Weinstein monofilament wire test within normal limits  bilaterally. Muscle power within normal limits bilaterally.  Nails Thick disfigured discolored nails with subungual debris  Hallux nails  B/L.[name] No evidence of bacterial infection or drainage bilaterally.  Orthopedic  No limitations of motion  feet .  No crepitus or effusions noted.  No bony pathology or digital deformities noted. ADV 4th toe right foot.  HAV  B/L>  Skin  normotropic skin with no porokeratosis noted bilaterally.  No signs of infections or ulcers noted.  Pinch callus left hallux.   Onychomycosis  Pain in right toes  Pain in left toes  Pinch callus left foot.  Consent was obtained for treatment procedures.   Mechanical debridement of nails 1-5  bilaterally performed with a nail nipper.  Filed with dremel without incident.    Return office visit  3 months                  Told patient to return for periodic foot care and evaluation due to potential at risk complications.   Cordella Bold DPM

## 2024-04-23 DIAGNOSIS — H90A22 Sensorineural hearing loss, unilateral, left ear, with restricted hearing on the contralateral side: Secondary | ICD-10-CM | POA: Diagnosis not present

## 2024-04-23 DIAGNOSIS — Z923 Personal history of irradiation: Secondary | ICD-10-CM | POA: Diagnosis not present

## 2024-04-23 DIAGNOSIS — H903 Sensorineural hearing loss, bilateral: Secondary | ICD-10-CM | POA: Diagnosis not present

## 2024-04-23 DIAGNOSIS — D447 Neoplasm of uncertain behavior of aortic body and other paraganglia: Secondary | ICD-10-CM | POA: Diagnosis not present

## 2024-05-12 DIAGNOSIS — T1581XA Foreign body in other and multiple parts of external eye, right eye, initial encounter: Secondary | ICD-10-CM | POA: Diagnosis not present

## 2024-05-12 DIAGNOSIS — H43811 Vitreous degeneration, right eye: Secondary | ICD-10-CM | POA: Diagnosis not present

## 2024-05-12 DIAGNOSIS — H43812 Vitreous degeneration, left eye: Secondary | ICD-10-CM | POA: Diagnosis not present

## 2024-05-12 DIAGNOSIS — H353113 Nonexudative age-related macular degeneration, right eye, advanced atrophic without subfoveal involvement: Secondary | ICD-10-CM | POA: Diagnosis not present

## 2024-05-12 DIAGNOSIS — H353123 Nonexudative age-related macular degeneration, left eye, advanced atrophic without subfoveal involvement: Secondary | ICD-10-CM | POA: Diagnosis not present

## 2024-05-26 DIAGNOSIS — Z23 Encounter for immunization: Secondary | ICD-10-CM | POA: Diagnosis not present

## 2024-06-04 DIAGNOSIS — M810 Age-related osteoporosis without current pathological fracture: Secondary | ICD-10-CM | POA: Diagnosis not present

## 2024-06-04 DIAGNOSIS — R5382 Chronic fatigue, unspecified: Secondary | ICD-10-CM | POA: Diagnosis not present

## 2024-06-04 DIAGNOSIS — R7989 Other specified abnormal findings of blood chemistry: Secondary | ICD-10-CM | POA: Diagnosis not present

## 2024-06-04 DIAGNOSIS — Z6834 Body mass index (BMI) 34.0-34.9, adult: Secondary | ICD-10-CM | POA: Diagnosis not present

## 2024-06-04 DIAGNOSIS — M1991 Primary osteoarthritis, unspecified site: Secondary | ICD-10-CM | POA: Diagnosis not present

## 2024-06-04 DIAGNOSIS — M7989 Other specified soft tissue disorders: Secondary | ICD-10-CM | POA: Diagnosis not present

## 2024-06-04 DIAGNOSIS — R600 Localized edema: Secondary | ICD-10-CM | POA: Diagnosis not present

## 2024-06-04 DIAGNOSIS — L4059 Other psoriatic arthropathy: Secondary | ICD-10-CM | POA: Diagnosis not present

## 2024-06-04 DIAGNOSIS — E669 Obesity, unspecified: Secondary | ICD-10-CM | POA: Diagnosis not present

## 2024-06-14 ENCOUNTER — Telehealth: Payer: Self-pay | Admitting: Cardiology

## 2024-06-14 MED ORDER — AMLODIPINE BESYLATE 5 MG PO TABS: 5.0000 mg | ORAL_TABLET | Freq: Every day | ORAL | 2 refills | Status: AC

## 2024-06-14 MED ORDER — LOSARTAN POTASSIUM 100 MG PO TABS: 100.0000 mg | ORAL_TABLET | Freq: Every day | ORAL | 2 refills | Status: AC

## 2024-06-14 NOTE — Telephone Encounter (Signed)
*  STAT* If patient is at the pharmacy, call can be transferred to refill team.   1. Which medications need to be refilled? (please list name of each medication and dose if known)   amLODipine  (NORVASC ) 5 MG tablet    losartan  (COZAAR ) 100 MG tablet      2. Would you like to learn more about the convenience, safety, & potential cost savings by using the [facility]? No      3. Are you open to using the [facility] (Type [facility]. ). No    4. Which pharmacy/location (including street and city if local pharmacy) is medication to be sent to?  [id number] - [place], [place] - [address]     5. Do they need a 30 day or 90 day supply? 90 days

## 2024-06-14 NOTE — Telephone Encounter (Signed)
 Refills has been sent to the pharmacy.

## 2024-06-23 DIAGNOSIS — H353113 Nonexudative age-related macular degeneration, right eye, advanced atrophic without subfoveal involvement: Secondary | ICD-10-CM | POA: Diagnosis not present

## 2024-06-23 DIAGNOSIS — H43812 Vitreous degeneration, left eye: Secondary | ICD-10-CM | POA: Diagnosis not present

## 2024-06-23 DIAGNOSIS — H43811 Vitreous degeneration, right eye: Secondary | ICD-10-CM | POA: Diagnosis not present

## 2024-06-23 DIAGNOSIS — H353123 Nonexudative age-related macular degeneration, left eye, advanced atrophic without subfoveal involvement: Secondary | ICD-10-CM | POA: Diagnosis not present

## 2024-07-21 ENCOUNTER — Ambulatory Visit (INDEPENDENT_AMBULATORY_CARE_PROVIDER_SITE_OTHER): Admitting: Podiatry

## 2024-07-21 ENCOUNTER — Encounter: Payer: Self-pay | Admitting: Podiatry

## 2024-07-21 VITALS — Ht 60.0 in | Wt 189.8 lb

## 2024-07-21 DIAGNOSIS — M79674 Pain in right toe(s): Secondary | ICD-10-CM

## 2024-07-21 DIAGNOSIS — B351 Tinea unguium: Secondary | ICD-10-CM

## 2024-07-21 DIAGNOSIS — E119 Type 2 diabetes mellitus without complications: Secondary | ICD-10-CM

## 2024-07-21 DIAGNOSIS — L84 Corns and callosities: Secondary | ICD-10-CM

## 2024-07-21 DIAGNOSIS — M79675 Pain in left toe(s): Secondary | ICD-10-CM | POA: Diagnosis not present

## 2024-07-21 NOTE — Progress Notes (Signed)
 This patient returns to my office for at risk foot care.  This patient requires this care by a professional since this patient will be at risk due to having diabetes.  This patient is unable to cut nails herself since the patient cannot reach her nails.These nails are painful walking and wearing shoes.  .Painful callus left big toe.  This patient presents for at risk foot care today.  General Appearance  Alert, conversant and in no acute stress.  Vascular  Dorsalis pedis and posterior tibial  pulses are palpable  bilaterally.  Capillary return is within normal limits  bilaterally. Temperature is within normal limits  bilaterally.  Neurologic  Senn-Weinstein monofilament wire test within normal limits  bilaterally. Muscle power within normal limits bilaterally.  Nails Thick disfigured discolored nails with subungual debris  Hallux nails  B/L.[name] No evidence of bacterial infection or drainage bilaterally.  Orthopedic  No limitations of motion  feet .  No crepitus or effusions noted.  No bony pathology or digital deformities noted. ADV 4th toe right foot.  HAV  B/L>  Skin  normotropic skin with no porokeratosis noted bilaterally.  No signs of infections or ulcers noted.  Pinch callus left hallux.   Onychomycosis  Pain in right toes  Pain in left toes  Pinch callus left foot.  Consent was obtained for treatment procedures.   Mechanical debridement of nails 1-5  bilaterally performed with a nail nipper.  Filed with dremel without incident.  Debride callus left hallux with dremel tool.   Return office visit  3 months                  Told patient to return for periodic foot care and evaluation due to potential at risk complications.   Cordella Bold DPM

## 2024-08-11 ENCOUNTER — Other Ambulatory Visit: Payer: Self-pay | Admitting: Cardiology

## 2024-08-13 NOTE — Telephone Encounter (Signed)
 Patient need make an appointment for refills. Thank 1st attempt

## 2024-10-20 ENCOUNTER — Ambulatory Visit: Admitting: Podiatry
